# Patient Record
Sex: Female | Born: 1950 | Race: Black or African American | Hispanic: No | Smoking: Never smoker
Health system: Southern US, Community
[De-identification: ages and names within clinical notes are randomized; demographics above are authoritative.]

## PROBLEM LIST (undated history)

## (undated) ENCOUNTER — Ambulatory Visit: Admission: EM | Payer: Medicare HMO | Source: Home / Self Care

## (undated) DIAGNOSIS — R112 Nausea with vomiting, unspecified: Secondary | ICD-10-CM

## (undated) DIAGNOSIS — S68119A Complete traumatic metacarpophalangeal amputation of unspecified finger, initial encounter: Secondary | ICD-10-CM

## (undated) DIAGNOSIS — M1990 Unspecified osteoarthritis, unspecified site: Secondary | ICD-10-CM

## (undated) DIAGNOSIS — I10 Essential (primary) hypertension: Secondary | ICD-10-CM

## (undated) DIAGNOSIS — E1140 Type 2 diabetes mellitus with diabetic neuropathy, unspecified: Secondary | ICD-10-CM

## (undated) DIAGNOSIS — E669 Obesity, unspecified: Secondary | ICD-10-CM

## (undated) DIAGNOSIS — E119 Type 2 diabetes mellitus without complications: Secondary | ICD-10-CM

## (undated) DIAGNOSIS — E785 Hyperlipidemia, unspecified: Secondary | ICD-10-CM

## (undated) DIAGNOSIS — Z9889 Other specified postprocedural states: Secondary | ICD-10-CM

## (undated) HISTORY — DX: Type 2 diabetes mellitus without complications: E11.9

## (undated) HISTORY — PX: COLONOSCOPY: SHX174

## (undated) HISTORY — PX: TOTAL HIP ARTHROPLASTY: SHX124

## (undated) HISTORY — DX: Hyperlipidemia, unspecified: E78.5

## (undated) HISTORY — DX: Unspecified osteoarthritis, unspecified site: M19.90

## (undated) HISTORY — DX: Type 2 diabetes mellitus with diabetic neuropathy, unspecified: E11.40

## (undated) HISTORY — PX: ABDOMINAL HYSTERECTOMY: SHX81

## (undated) HISTORY — PX: HERNIA REPAIR: SHX51

## (undated) HISTORY — PX: REPLACEMENT TOTAL KNEE: SUR1224

## (undated) HISTORY — PX: OOPHORECTOMY: SHX6387

## (undated) HISTORY — DX: Essential (primary) hypertension: I10

---

## 2006-04-20 ENCOUNTER — Ambulatory Visit (HOSPITAL_COMMUNITY): Admission: RE | Admit: 2006-04-20 | Discharge: 2006-04-20 | Payer: Self-pay | Admitting: Family Medicine

## 2006-04-23 ENCOUNTER — Ambulatory Visit: Payer: Self-pay | Admitting: Orthopedic Surgery

## 2006-05-20 ENCOUNTER — Ambulatory Visit: Payer: Self-pay | Admitting: Orthopedic Surgery

## 2006-06-09 ENCOUNTER — Ambulatory Visit: Payer: Self-pay | Admitting: Orthopedic Surgery

## 2006-06-09 ENCOUNTER — Encounter: Payer: Self-pay | Admitting: Orthopedic Surgery

## 2006-06-09 ENCOUNTER — Inpatient Hospital Stay (HOSPITAL_COMMUNITY): Admission: RE | Admit: 2006-06-09 | Discharge: 2006-06-12 | Payer: Self-pay | Admitting: Orthopedic Surgery

## 2006-06-12 ENCOUNTER — Inpatient Hospital Stay (HOSPITAL_COMMUNITY): Admission: EM | Admit: 2006-06-12 | Discharge: 2006-06-15 | Payer: Self-pay | Admitting: Emergency Medicine

## 2006-06-15 ENCOUNTER — Inpatient Hospital Stay: Admission: AD | Admit: 2006-06-15 | Discharge: 2006-06-22 | Payer: Self-pay | Admitting: Family Medicine

## 2006-06-22 ENCOUNTER — Ambulatory Visit: Payer: Self-pay | Admitting: Orthopedic Surgery

## 2006-07-16 ENCOUNTER — Encounter (HOSPITAL_COMMUNITY): Admission: RE | Admit: 2006-07-16 | Discharge: 2006-08-15 | Payer: Self-pay | Admitting: Orthopedic Surgery

## 2006-07-21 ENCOUNTER — Ambulatory Visit: Payer: Self-pay | Admitting: Orthopedic Surgery

## 2006-09-02 ENCOUNTER — Ambulatory Visit: Payer: Self-pay | Admitting: Orthopedic Surgery

## 2006-12-03 ENCOUNTER — Ambulatory Visit: Payer: Self-pay | Admitting: Orthopedic Surgery

## 2006-12-03 DIAGNOSIS — M169 Osteoarthritis of hip, unspecified: Secondary | ICD-10-CM | POA: Insufficient documentation

## 2007-05-05 ENCOUNTER — Ambulatory Visit (HOSPITAL_COMMUNITY): Admission: RE | Admit: 2007-05-05 | Discharge: 2007-05-05 | Payer: Self-pay | Admitting: Family Medicine

## 2007-05-26 ENCOUNTER — Ambulatory Visit: Payer: Self-pay | Admitting: Orthopedic Surgery

## 2007-05-26 DIAGNOSIS — Z96642 Presence of left artificial hip joint: Secondary | ICD-10-CM | POA: Insufficient documentation

## 2007-05-26 DIAGNOSIS — M25569 Pain in unspecified knee: Secondary | ICD-10-CM | POA: Insufficient documentation

## 2007-05-26 DIAGNOSIS — Z96659 Presence of unspecified artificial knee joint: Secondary | ICD-10-CM | POA: Insufficient documentation

## 2008-05-16 ENCOUNTER — Ambulatory Visit (HOSPITAL_COMMUNITY): Admission: RE | Admit: 2008-05-16 | Discharge: 2008-05-16 | Payer: Self-pay | Admitting: Family Medicine

## 2008-08-08 ENCOUNTER — Ambulatory Visit (HOSPITAL_COMMUNITY): Admission: RE | Admit: 2008-08-08 | Discharge: 2008-08-08 | Payer: Self-pay | Admitting: Family Medicine

## 2008-12-13 HISTORY — PX: COLONOSCOPY: SHX174

## 2008-12-14 ENCOUNTER — Encounter: Payer: Self-pay | Admitting: Gastroenterology

## 2008-12-19 ENCOUNTER — Ambulatory Visit (HOSPITAL_COMMUNITY): Admission: RE | Admit: 2008-12-19 | Discharge: 2008-12-19 | Payer: Self-pay | Admitting: Gastroenterology

## 2008-12-19 ENCOUNTER — Ambulatory Visit: Payer: Self-pay | Admitting: Gastroenterology

## 2009-02-02 ENCOUNTER — Inpatient Hospital Stay (HOSPITAL_COMMUNITY): Admission: RE | Admit: 2009-02-02 | Discharge: 2009-02-03 | Payer: Self-pay | Admitting: General Surgery

## 2009-05-17 ENCOUNTER — Ambulatory Visit (HOSPITAL_COMMUNITY): Admission: RE | Admit: 2009-05-17 | Discharge: 2009-05-17 | Payer: Self-pay | Admitting: Family Medicine

## 2010-02-03 ENCOUNTER — Encounter: Payer: Self-pay | Admitting: Family Medicine

## 2010-03-31 LAB — BASIC METABOLIC PANEL
BUN: 12 mg/dL (ref 6–23)
Calcium: 9.7 mg/dL (ref 8.4–10.5)
Chloride: 102 mEq/L (ref 96–112)
Creatinine, Ser: 0.77 mg/dL (ref 0.4–1.2)
GFR calc non Af Amer: >60 mL/min (ref >60)
Potassium: 4 mEq/L (ref 3.5–5.1)
Sodium: 135 mEq/L (ref 135–145)

## 2010-03-31 LAB — CBC
HCT: 40 % (ref 36.0–46.0)
MCHC: 33.3 g/dL (ref 30.0–36.0)

## 2010-05-15 ENCOUNTER — Encounter: Payer: Self-pay | Admitting: Orthopedic Surgery

## 2010-05-17 ENCOUNTER — Other Ambulatory Visit: Payer: Self-pay | Admitting: Family Medicine

## 2010-05-17 DIAGNOSIS — Z139 Encounter for screening, unspecified: Secondary | ICD-10-CM

## 2010-05-24 ENCOUNTER — Ambulatory Visit (HOSPITAL_COMMUNITY)
Admission: RE | Admit: 2010-05-24 | Discharge: 2010-05-24 | Disposition: A | Discharge status: Home / Self Care | Payer: Medicare Other | Source: Ambulatory Visit | Attending: Family Medicine | Admitting: Family Medicine

## 2010-05-24 DIAGNOSIS — Z139 Encounter for screening, unspecified: Secondary | ICD-10-CM

## 2010-05-24 DIAGNOSIS — Z1231 Encounter for screening mammogram for malignant neoplasm of breast: Secondary | ICD-10-CM | POA: Insufficient documentation

## 2010-05-28 NOTE — Op Note (Signed)
NAMESHALIKA, ARNTZ NO.:  [id number]   MEDICAL RECORD NO.:  [id number]          PATIENT TYPE:  INP   LOCATION:  A340                          FACILITY:  [facility]   PHYSICIAN:  Vickki Hearing, M.D.DATE OF BIRTH:  Jul 31, 1950   DATE OF PROCEDURE:  06/09/2006  DATE OF DISCHARGE:                               OPERATIVE REPORT   HISTORY:  A 60 year old female with end-stage osteoarthritis of the left  hip presented with pain and dysfunction, failed conservative therapy.  After discussion of the risks and benefits of the procedure the informed  consent process was completed.  The patient understood her risks of  surgery and agreed for total hip replacement.  Her preoperative  treatment included anti-inflammatories and then Vicodin extra strength.   PREOPERATIVE DIAGNOSES:  Osteoarthritis left hip.   POSTOPERATIVE DIAGNOSES:  Osteoarthritis left hip.   PROCEDURE:  Left total hip system DePuy Cori stem number 11 standard  offset +9, 32 head with a 32 liner, 52 cup.  No screws.   SURGEON:  Vickki Hearing, M.D.   ASSISTANTDeniece Portela McFedder.  Marisa Severin.   SPECIMENS:  Femoral head went to path.   BLOOD LOSS:  300 mL.   COMPLICATIONS:  No complications.  The patient to PACU in good  condition.   DESCRIPTION OF PROCEDURE:  The patient's left hip was marked by the  patient as the surgical site.  I countersigned it and updated her  history and physical.  Took her to surgery for spinal anesthetic.  Then  we started antibiotics.  She was placed in a lateral decubitus position  right side down left side up, padding in the axilla and appropriately on  the leg.  She was put in the hip positioner and her left leg was prepped  and draped using sterile technique.   The time-out procedure was then completed.   A straight incision was made over the greater trochanter , extended  distally and curved gently proximally.  Subcutaneous tissue was divided  down to the  fascia.  The fascia was split in line with the skin incision  and the anterior and posterior aspects of the abductor mechanism was  identified.  With blunt dissection in line with the fibers of the  gluteus medius this was carried down to the greater trochanter.  Subperiosteal dissection was used to remove the gluteus medius and  gluteus minimus tendons from the trochanter and they were reflected  proximally and held with Steinmann pins.  The hip was dislocated  anteriorly.  Provisional femoral head cut was made followed by removal  of inferior and anterior capsule.  The femoral neck cutting guide was  used to refine the femoral neck cut.  Box osteotome, curette and starter  reamer were passed in the canal and then broaches were passed up to a  size 11.  The femoral neck was planed with a planer and the leg was then  placed in extension and slight flexion and the acetabulum was cleared of  all debris and bony osteophytes.   Retractors were placed to expose the acetabulum and  the acetabulum was  reamed starting with a 44 up to a 52.  A 52 trial liner was then placed.  A 32 trial liner was placed and then the standard femoral head +5 and +9  were placed on the femoral stem and trial reductions were performed.  A  +9 gave the best stability and leg lengths, had good extension, 5  degrees, 50 degrees of external rotation, flexion 120, at 90 degrees  flexion and 45 degrees of internal rotation.   The trial prosthesis were removed, the real implants were placed and a  repeat reduction and range of motion was done.  They matched our preop  range of motion.  We did place a hole eliminator in the central portion  of the acetabular cup.   The wound was irrigated.  The abductors were repaired with #5 Tycron  suture followed by closure of the fascia with #1 interrupted Bralon.  Subfascial layer was injected with 30 mL of Marcaine with epinephrine  0.25%.   We placed a pain pump catheter in the  subcu and closed with 0 and 2-0  Monocryl.  Staples applied to the skin.  Sterile dressing.  The patient  taken back to her bed with abduction pillow in place and to recovery  room in stable condition.  Postop plan full weightbearing, standard  protocol.      Vickki Hearing, M.D.  Electronically Signed     SEH/MEDQ  D:  06/09/2006  T:  06/09/2006  Job:  [id number]

## 2010-05-28 NOTE — Discharge Summary (Signed)
NAMEVALECIA, BESKE NO.:  [id number]   MEDICAL RECORD NO.:  [id number]          PATIENT TYPE:  INP   LOCATION:  A332                          FACILITY:  [facility]   PHYSICIAN:  Vickki Hearing, M.D.DATE OF BIRTH:  [birth year]   DATE OF ADMISSION:  06/12/2006  DATE OF DISCHARGE:  06/02/2008LH                               DISCHARGE SUMMARY   ADDENDUM:  She had a total hip replacement last Tuesday and discharged  to home Friday, came back to the hospital on the 31st, saying she just  did not feel like she was anemic.  She was typed and crossed for 2 units  of blood which were given and on June 1, hemoglobin was 11.1.  She was  stable, had some left lower extremity swelling and pain but nothing out  of the ordinary, and she is discharged to [facility] with no change in  the discharge summary noted on the 30th.      Vickki Hearing, M.D.  Electronically Signed     SEH/MEDQ  D:  06/15/2006  T:  06/15/2006  Job:  [id number]

## 2010-05-28 NOTE — Group Therapy Note (Signed)
NAMESOPHIAROSE, EADES NO.:  [id number]   MEDICAL RECORD NO.:  [id number]          PATIENT TYPE:  INP   LOCATION:  A340                          FACILITY:  [facility]   PHYSICIAN:  Vickki Hearing, M.D.DATE OF BIRTH:  19-Dec-1950   DATE OF PROCEDURE:  DATE OF DISCHARGE:                                 PROGRESS NOTE   T-max of 100.8, blood pressure is 137/69, pulse run in low 100/3  recorded, respiratory rate of 20.  O2 sat 99% on 2 liters. Hemoglobin  today is 10.9, sodium is 132, glucose 149, BUN and creatinine 7 and  0.97. She is status post a total hip replacement on the left pain  medicine response at this time on a PCA, morphine, and oral analgesics,  as well as a pain pump catheter reveal anywhere from a 1 to a 10, last  recorded was a 4 to a 6.  She is in therapy.  She is on DVT prevention  with Lovenox.  We will can continue therapy and adjust pain medicines as  required.      Vickki Hearing, M.D.  Electronically Signed     SEH/MEDQ  D:  06/11/2006  T:  06/11/2006  Job:  [id number]

## 2010-05-28 NOTE — Discharge Summary (Signed)
[name], [name] NO.:  [id number]   MEDICAL RECORD NO.:  [id number]          PATIENT TYPE:  INP   LOCATION:  A340                          FACILITY:  [facility]   PHYSICIAN:  Vickki Hearing, M.D.DATE OF BIRTH:  Oct 05, 1950   DATE OF ADMISSION:  06/09/2006  DATE OF DISCHARGE:  LH                               DISCHARGE SUMMARY   The patient max is 100.8 yesterday, it is currently 99.6, pulse 101 been  running low 100s.  Respiratory rate 20.  Blood pressure 118/60.  I&O are  good.  Hemoglobin is 9.6, potassium is 4.0, BUN and creatinine 7 and  0.84.  She is taking 2 tabs every 4 hours of Vicodin.  All indications  are that the patient has progressed well and will be able to be  discharged today.  Follow up will be June 22, 2006.      Vickki Hearing, M.D.  Electronically Signed     SEH/MEDQ  D:  06/12/2006  T:  06/12/2006  Job:  [id number]

## 2010-05-28 NOTE — Discharge Summary (Signed)
NAMEBAILEE, METTER NO.:  [id number]   MEDICAL RECORD NO.:  [id number]          PATIENT TYPE:  INP   LOCATION:  A340                          FACILITY:  [facility]   PHYSICIAN:  Vickki Hearing, M.D.DATE OF BIRTH:  1950-05-11   DATE OF ADMISSION:  06/09/2006  DATE OF DISCHARGE:  05/30/2008LH                               DISCHARGE SUMMARY   ADMITTING DIAGNOSIS:  Osteoarthritis of the left hip.   DISCHARGE DIAGNOSIS:  Osteoarthritis of the left hip.   SURGEON:  Vickki Hearing, M.D.   ANESTHETIC:  Spinal.   OPERATIVE FINDINGS:  Severe osteoarthritis of the left hip.   HISTORY AND HOSPITAL COURSE:  A 60 year old female with end-stage  osteoarthritis of the left hip presented with pain and dysfunction,  failed conservative treatment, consented for surgery by informed  consent.   She was brought in on the 27th, had an uncomplicated total hip  replacement on the left with a DePuy Press-Fit stem, Press-Fit head.  I  used a 32-mm head, 32 liner, 52 cup, no screws, standard 11 prosthesis  with a +9 neck head.   Postoperatively, the patient did well, no complications, tolerated  physical therapy well. Discharge hemoglobin is 9.6.   DISCHARGE INSTRUCTIONS:  Routine protocol for total hips. She will be  discharged with Lovenox 40 mg subcu q. day for the balance of 28 days,  iron 325 p.o. t.i.d., will give her Vicodin 10/650 instead of two 5 mg.  Will give her Robaxin 500 mg q. 6 p.r.n. She is discharged home.   CONDITION:  Improved.  Home health will come out for therapy.      Vickki Hearing, M.D.  Electronically Signed     SEH/MEDQ  D:  06/12/2006  T:  06/12/2006  Job:  [id number]

## 2010-05-28 NOTE — H&P (Signed)
[name], [name]               ACCOUNT NO.:  [id number]   MEDICAL RECORD NO.:  [id number]          PATIENT TYPE:  INP   LOCATION:  A332                          FACILITY:  [facility]   PHYSICIAN:  J. Darreld Mclean, M.D. DATE OF BIRTH:  06/24/1950   DATE OF ADMISSION:  06/12/2006  DATE OF DISCHARGE:  LH                              HISTORY & PHYSICAL   HISTORY OF PRESENT ILLNESS:  The patient was discharged from the  hospital yesterday morning.  Yesterday afternoon around 5 o'clock, she  presented back to the hospital emergency room, said she was unable to  take care of her self at home, unable to move about.  She was in  significant pain and just did not feel right.  The ER called me later in  the evening.  I told them to go ahead and re-admit her and pull her old  records and just resume the admission.  Resumed all the orders.   Her x-ray shows the hip is located on the left.  She had a total hip  arthroplasty.   Hemoglobin 8.4  The rest of the labs are normal, except for a slight  decrease in her sodium 129.   PHYSICAL EXAMINATION:  GENERAL:  She is alert, cooperative, and  oriented.  HEENT/NECK:  Neck is supple.  LUNGS:  Clear to P and A.  HEART:  Regular rhythm without murmur heard.  ABDOMEN:  Soft, nontender. With no masses.  EXTREMITIES:  Left hip wound.  She has good motion to the left hip.  Leg  lengths appear to be equal.  Other extremities negative.  CNS:  Intact  SKIN:  Intact.   IMPRESSION:  1. Status post left total hip arthroplasty on Tuesday of this past      week.  2. Postop anemia.  3. Mild pain consistent with postop procedure.   PLAN:  I will have physical therapy work with her.  Continue her current  medications and transfuse today.   Dr. Romeo Apple is out of town and I will follow her this weekend.  Physical therapy continued doing her rehab.                                            ______________________________  J. Darreld Mclean, M.D.     JWK/MEDQ  D:  06/13/2006  T:  06/13/2006  Job:  [id number]

## 2010-05-31 NOTE — H&P (Signed)
NAMERHONNA, HOLSTER NO.:  [id number]   MEDICAL RECORD NO.:  [id number]          PATIENT TYPE:  AMB   LOCATION:  DAY                           FACILITY:  [facility]   PHYSICIAN:  Vickki Hearing, M.D.DATE OF BIRTH:  1950/07/27   DATE OF ADMISSION:  DATE OF DISCHARGE:  LH                              HISTORY & PHYSICAL   CHIEF COMPLAINT:  Pain left hip.   This is a 60 year old female status post right total knee replacement  sent to me by Dr. Wende Crease and Dr. Hilda Lias for evaluation for left total  hip.   She has a strong family history of osteoarthritis with her sisters who  had joint replacements.  She also had a sister who had a lumbar fusion.   The patient has had pain for the last year graded 10/10, worse with  standing for long periods of time, walking long distances.  Improved  with rest, Vicodin ES and Naprosyn.  Her symptoms are described as sharp  radiating pain associated with giving out and weakness of the left leg.  She denies any back pain.   REVIEW OF SYSTEMS:  Includes weight gain, joint swelling, joint pain.  Denies chest pain, shortness of breath, nausea, kidney disease,  headache, dizziness, migraine, thyroid disease, diabetes, depression,  mood swing, eczema, poor vision, seasonal allergy or lymph node disease.   PAST HISTORY:  No known drug allergies.  No major medical problems.  She  had [place] right total knee done in [place].   CURRENT MEDICINES:  Vicodin ES and naproxen.   FAMILY HISTORY:  Arthritis.   She is separated, disabled.  She was not smoke or drink.  Highest grade  completed was 8.   PHYSICAL EXAMINATION:  VITAL SIGNS:  Weight 249, pulse 82, respiratory  rate 16.  APPEARANCE:  Body habitus endomorphic.  Development was normal.  Normal  nutrition.  Grooming and hygiene normal.  No deformity.  PERIPHERAL VASCULAR SYSTEM OBSERVATION:  No swelling or varicose veins.  Palpation of all four pulses were normal.  EXTREMITIES:  Warm  to touch  without edema or tenderness.  HEART:  Rate and rhythm were normal.  CHEST:  Clear.  ABDOMEN:  Soft.  LYMPH NODES:  Cervical spine were negative.  MUSCULOSKELETAL:  Gait and stations show a mild limp, slight varus to  the left knee, previous right total knee with excellent function and  motion.  Upper extremities:  Full range of motion, strength, stability  and alignment.  No reflex abnormality.  Left hip exam:  Flexion was only to 85 degrees, internal rotation 5,  external rotation 20, abduction 30, adduction 10, strength in the limb  was normal.  SKIN:  Integrity was intact.  Slight hypertrophic scar on the right.  NEUROLOGIC:  She is awake, alert and oriented x3.  Mood and affect  normal.   Radiographs showed degenerative joint disease of the hip.  These were  taken December 2007; repeated April 23, 2006.  The femoral head appears  to be entrapped in bony osteophytes.   Left knee shows DJD, primary patellofemoral disease, with symmetric  joint spaces though narrowed.   IMPRESSION:  1. Osteoarthritis left hip.  2. Previous right total hip.  3. Osteoarthritis left knee.   PLAN:  Left total hip with a DePuy system.  Will use Press-Fit stem,  Press-Fit cup with screws, 32-36 femoral head.   The surgery is scheduled for May 27.      Vickki Hearing, M.D.  Electronically Signed     SEH/MEDQ  D:  05/20/2006  T:  05/20/2006  Job:  [id number]

## 2010-06-06 ENCOUNTER — Encounter: Payer: Self-pay | Admitting: Orthopedic Surgery

## 2010-06-06 ENCOUNTER — Ambulatory Visit (INDEPENDENT_AMBULATORY_CARE_PROVIDER_SITE_OTHER): Payer: Medicare Other | Admitting: Orthopedic Surgery

## 2010-06-06 VITALS — HR 76 | Ht 68.0 in | Wt 280.0 lb

## 2010-06-06 DIAGNOSIS — IMO0002 Reserved for concepts with insufficient information to code with codable children: Secondary | ICD-10-CM

## 2010-06-06 DIAGNOSIS — M1710 Unilateral primary osteoarthritis, unspecified knee: Secondary | ICD-10-CM

## 2010-06-06 NOTE — Progress Notes (Signed)
X-ray report.  3 views, LEFT knee.  LEFT knee pain.  Minimal deformity in the LEFT knee. Lateral films show bone to bone changes. AP film shows mild/moderate joint space narrowing, symmetric. There are multiple osteophytes, especially around the patella, some around the tibia.  Impression osteoarthritis, LEFT knee

## 2010-06-06 NOTE — Patient Instructions (Signed)
Return in August for pre-op  

## 2010-06-06 NOTE — Progress Notes (Signed)
Pain LEFT knee.  60 years old, status post LEFT hip replacement, presents with LEFT knee pain, which is sharp, 7/10, tends to come and go, associated with difficulty climbing stairs, getting out of a chair and ambulating. He swells as well.  She is interested in knee replacement surgery.  Medical systems review reports weight gain joint pain joint swelling. All other systems reviewed negative.  Family History  Problem Relation Age of Onset  . Arthritis     Past Medical History  Diagnosis Date  . Arthritis   . High cholesterol    Past Surgical History  Procedure Date  . Joint replacement     right knee  . Left hip replacement   . Ovary removed   . Hernia removed     x2   History   Social History  . Marital Status: Single    Spouse Name: N/A    Number of Children: N/A  . Years of Education: N/A   Occupational History  . Not on file.   Social History Main Topics  . Smoking status: Never Smoker   . Smokeless tobacco: Not on file  . Alcohol Use: No  . Drug Use: No  . Sexually Active: Not on file   Other Topics Concern  . Not on file   Social History Narrative  . No narrative on file      General: The patient is normally developed, with normal grooming and hygiene. There are no gross deformities. The body habitus mild mod obesity CDV: The pulse and perfusion of the extremities are normal   LYMPH: There is no gross lymphadenopathy in the extremities   Skin: There are no rashes, ulcers or cafe-au-lait spot   Psyche: The patient is alert, awake and oriented.  Mood is normal   Neuro:  The coordination and balance are normal.  Sensation is normal. Reflexes are 2+ and equal   Musculoskeletal  LEFT knee flexion 102. There does not appear to be flexion contracture.No major deformities. Muscle strength. Muscle tone are normal.  He appears to be stable front to back and side to side.  Negative joint effusion. Tenderness medial and lateral joint line.  Upper  extremity exam  Inspection and palpation revealed no abnormalities in the upper extremities.  Range of motion is full without contracture.  Motor exam is normal with grade 5 strength.  The joints are fully reduced without subluxation.  There is no atrophy or tremor and muscle tone is normal.  All joints are stable.  X-ray shows osteoarthritis, LEFT knee, minimal deformity.  Diagnosis osteoarthritis, LEFT knee.  Plan LEFT total knee replacement.  She will return for preop paperwork.  Declined non operative treatment

## 2010-06-25 ENCOUNTER — Encounter: Payer: Self-pay | Admitting: Orthopedic Surgery

## 2010-06-25 ENCOUNTER — Ambulatory Visit: Payer: Self-pay | Admitting: Orthopedic Surgery

## 2010-08-15 ENCOUNTER — Encounter: Payer: Self-pay | Admitting: Orthopedic Surgery

## 2010-08-15 ENCOUNTER — Other Ambulatory Visit: Payer: Self-pay | Admitting: Orthopedic Surgery

## 2010-08-15 ENCOUNTER — Ambulatory Visit (INDEPENDENT_AMBULATORY_CARE_PROVIDER_SITE_OTHER): Payer: Medicare Other | Admitting: Orthopedic Surgery

## 2010-08-15 DIAGNOSIS — M6530 Trigger finger, unspecified finger: Secondary | ICD-10-CM

## 2010-08-15 DIAGNOSIS — IMO0002 Reserved for concepts with insufficient information to code with codable children: Secondary | ICD-10-CM

## 2010-08-15 DIAGNOSIS — M1710 Unilateral primary osteoarthritis, unspecified knee: Secondary | ICD-10-CM | POA: Insufficient documentation

## 2010-08-15 MED ORDER — METHYLPREDNISOLONE ACETATE 40 MG/ML IJ SUSP: 40.0000 mg | Freq: Once | INTRAMUSCULAR | Status: DC

## 2010-08-15 NOTE — Progress Notes (Signed)
2007 RIGHT knee in [place]. LEFT total hip hear in May of 2008.  Stability scheduling for LEFT knee replacement.  Complain of LEFT trigger thumb for injection today.  Also complains of radicular pain, RIGHT lower extremity with pain in her RIGHT knee and also pain in her RIGHT hip. So we will get a lumbar spine series and a RIGHT hip. X-ray with pelvis and LEFT hip. X-ray but we will do those at the hospital.  See preop h/p

## 2010-08-15 NOTE — H&P (Signed)
[name] is an 60 y.o. female.   Chief Complaint: left knee pain  HPI: This patient is 60 years old. He had a RIGHT total knee arthroplasty in 2007. She had a LEFT total hip replacement approximately 2 years ago presents now with complaints of severe dull, aching pain, which has become constant in the LEFT knee. Her x-rays show degenerative arthritis of the LEFT knee. She would like to proceed with a LEFT total knee replacement. She understands the risks and benefits of the surgery and understands that nonoperative treatment is still an option.    Past Medical History  Diagnosis Date  . Arthritis   . High cholesterol     Past Surgical History  Procedure Date  . Joint replacement     right knee  . Left hip replacement   . Ovary removed   . Hernia removed     x2    Family History  Problem Relation Age of Onset  . Arthritis     Social History:  reports that she has never smoked. She does not have any smokeless tobacco history on file. She reports that she does not drink alcohol or use illicit drugs.  Allergies: No Known Allergies  Medications Prior to Admission  Medication Sig Dispense Refill  . aspirin 81 MG tablet Take 81 mg by mouth daily.        . Calcium Carbonate-Vitamin D (CALCIUM + D PO) Take by mouth.        . fish oil-omega-3 fatty acids 1000 MG capsule Take 2 g by mouth daily.        . Flaxseed, Linseed, (FLAX SEED OIL PO) Take by mouth.        Marland Kitchen PRAVASTATIN SODIUM PO Take by mouth.         No current facility-administered medications on file as of 08/15/2010.    No results found for this or any previous visit (from the past 48 hour(s)). @RISRSLT48 @  Review of Systems  Constitutional: Negative.   HENT: Negative.   Eyes: Negative.   Respiratory: Negative.   Cardiovascular: Negative.   Gastrointestinal: Negative.   Genitourinary: Negative.   Skin: Negative.   Neurological: Negative.   Endo/Heme/Allergies: Negative.   Psychiatric/Behavioral: Negative.      There were no vitals taken for this visit. Physical Exam  Vital signs are stable as recorded  General appearance is normal  The patient is alert and oriented x3  The patient's mood and affect are normal  Gait assessment: She has an abnormal gait pattern associated with the previous joint replacement and the pain in the LEFT knee The cardiovascular exam reveals normal pulses and temperature without edema swelling.  The lymphatic system is negative for palpable lymph nodes  The sensory exam is normal.  There are no pathologic reflexes.  Balance is normal.   Exam of the LEFT knee Inspection Medial joint line tenderness, mild joint swelling Range of motion Minimal flexion contracture, arc and flexion is 115 Stability Of the ligaments tested normal Strength Normal Skin Normal  Upper extremity exam  Inspection and palpation revealed no abnormalities in the upper extremities.  Range of motion is full without contracture.  Motor exam is normal with grade 5 strength.  The joints are fully reduced without subluxation.  There is no atrophy or tremor and muscle tone is normal.  All joints are stable.   RLE exam knee flexion is 120 knee is stable ligs are stable no tenderness    Assessment/Plan  XRAYS  3 views, LEFT knee were done in May. The alignment of the knee has slight varus. There is notable degenerative change in the medial and patellofemoral compartments with minimal osteophytes. Impression osteoarthritis with mild deformity, LEFT knee.  OA LEFT KNEE   LEFT TKA DEPUY  Fuller Canada 08/15/2010, 9:29 AM

## 2010-08-15 NOTE — Patient Instructions (Signed)
You have been scheduled for surgery.  All surgeries carry some risk.  Remember you always have the option of continued nonsurgical treatment. However in this situation the risks vs. the benefits favor surgery as the best treatment option. The risks of the surgery includes the following but is not limited to bleeding, infection, pulmonary embolus, death from anesthesia, nerve injury vascular injury or need for further surgery, continued pain.  Specific to this procedure the following risks and complications are rare but possible Stiffness, pain, infection which requires revision surgery.

## 2010-08-21 ENCOUNTER — Telehealth: Payer: Self-pay | Admitting: Orthopedic Surgery

## 2010-08-21 NOTE — Telephone Encounter (Signed)
No new note. Error

## 2010-08-29 ENCOUNTER — Telehealth: Payer: Self-pay | Admitting: Orthopedic Surgery

## 2010-08-29 NOTE — Telephone Encounter (Signed)
Contacted [employer] insurance (initially 08/21/10) and 08/29/10, left voice mail message at [phone].  Per automated response system, faxed clinicals to fax [phone].  CPT [id number], ICD9 codes 715.16, 715.96.  In-patient surgery, scheduled 09/09/10 at [facility]

## 2010-09-02 NOTE — Telephone Encounter (Signed)
09/02/10 Received call back from [facility], [place], ph [phone], in response to fax of clinicals.  Received authorization # for 09/09/10 in-patient surgery per above note, CPT 27447.  Auth # [id number], approved for up to 7 day stay at [facility].

## 2010-09-04 ENCOUNTER — Other Ambulatory Visit (HOSPITAL_COMMUNITY): Payer: Self-pay | Admitting: Orthopedic Surgery

## 2010-09-04 ENCOUNTER — Ambulatory Visit (HOSPITAL_COMMUNITY)
Admission: RE | Admit: 2010-09-04 | Discharge: 2010-09-04 | Disposition: A | Discharge status: Home / Self Care | Payer: Medicare Other | Source: Ambulatory Visit | Attending: Orthopedic Surgery | Admitting: Orthopedic Surgery

## 2010-09-04 ENCOUNTER — Encounter (HOSPITAL_COMMUNITY)
Admission: RE | Admit: 2010-09-04 | Discharge: 2010-09-04 | Disposition: A | Discharge status: Home / Self Care | Payer: Medicare Other | Source: Ambulatory Visit | Attending: Orthopedic Surgery | Admitting: Orthopedic Surgery

## 2010-09-04 ENCOUNTER — Other Ambulatory Visit: Payer: Self-pay

## 2010-09-04 ENCOUNTER — Encounter (HOSPITAL_COMMUNITY): Payer: Self-pay

## 2010-09-04 DIAGNOSIS — R52 Pain, unspecified: Secondary | ICD-10-CM

## 2010-09-04 HISTORY — DX: Other specified postprocedural states: Z98.890

## 2010-09-04 HISTORY — DX: Nausea with vomiting, unspecified: R11.2

## 2010-09-04 HISTORY — DX: Complete traumatic metacarpophalangeal amputation of unspecified finger, initial encounter: S68.119A

## 2010-09-04 LAB — SURGICAL PCR SCREEN
MRSA, PCR: NEGATIVE
Staphylococcus aureus: NEGATIVE

## 2010-09-04 LAB — CBC
MCH: 28.3 pg (ref 26.0–34.0)
MCHC: 32.6 g/dL (ref 30.0–36.0)
MCV: 86.8 fL (ref 78.0–100.0)
Platelets: 190 10*3/uL (ref 150–400)
RDW: 13.6 % (ref 11.5–15.5)

## 2010-09-04 LAB — PREPARE RBC (CROSSMATCH)

## 2010-09-04 LAB — PROTIME-INR: Prothrombin Time: 12.2 seconds (ref 11.6–15.2)

## 2010-09-04 NOTE — Patient Instructions (Signed)
[address] [place] [name]  09/04/2010   Your procedure is scheduled on:  09/09/10  Report to [facility] at  615  AM.  Call this number if you have problems the morning of surgery: [phone]   Remember:   Do not eat food:After Midnight.  Do not drink clear liquids: After Midnight.  Take these medicines the morning of surgery with A SIP OF WATER: none   Do not wear jewelry, make-up or nail polish.  Do not wear lotions, powders, or perfumes. You may wear deodorant.  Do not shave 48 hours prior to surgery.  Do not bring valuables to the hospital.  Contacts, dentures or bridgework may not be worn into surgery.  Leave suitcase in the car. After surgery it may be brought to your room.  For patients admitted to the hospital, checkout time is 11:00 AM the day of discharge.   Patients discharged the day of surgery will not be allowed to drive home.  Name and phone number of your driver: family  Special Instructions: CHG Shower Use Special Wash: 1/2 bottle night before surgery and 1/2 bottle morning of surgery.   Please read over the following fact sheets that you were given: Pain Booklet, Coughing and Deep Breathing, Blood Transfusion Information, Lab Information, Total Joint Packet, MRSA Information, Surgical Site Infection Prevention, Anesthesia Post-op Instructions and Care and Recovery After Surgery PATIENT INSTRUCTIONS POST-ANESTHESIA  IMMEDIATELY FOLLOWING SURGERY:  Do not drive or operate machinery for the first twenty four hours after surgery.  Do not make any important decisions for twenty four hours after surgery or while taking narcotic pain medications or sedatives.  If you develop intractable nausea and vomiting or a severe headache please notify your doctor immediately.  FOLLOW-UP:  Please make an appointment with your surgeon as instructed. You do not need to follow up with anesthesia unless specifically instructed to do so.  WOUND CARE INSTRUCTIONS (if applicable):  Keep a dry clean  dressing on the anesthesia/puncture wound site if there is drainage.  Once the wound has quit draining you may leave it open to air.  Generally you should leave the bandage intact for twenty four hours unless there is drainage.  If the epidural site drains for more than 36-48 hours please call the anesthesia department.  QUESTIONS?:  Please feel free to call your physician or the hospital operator if you have any questions, and they will be happy to assist you.     [facility] Anesthesia Department [address] [place] [phone]

## 2010-09-06 ENCOUNTER — Telehealth: Payer: Self-pay | Admitting: Orthopedic Surgery

## 2010-09-06 ENCOUNTER — Telehealth: Payer: Self-pay | Admitting: *Deleted

## 2010-09-06 NOTE — Telephone Encounter (Signed)
Faxed information to [place] and [employer] for assistance with post op TKA

## 2010-09-06 NOTE — Telephone Encounter (Signed)
In following up with [facility], ph [phone], reached [phone] - DIRECT PH,Deborah W,nurse reviewer. She states the authorization was just updated/approved for correct date 09/09/10, for up to 7 days.  Original auth had listed Oct.27. Updated and taken care of.

## 2010-09-09 ENCOUNTER — Encounter (HOSPITAL_COMMUNITY)
Admission: RE | Disposition: A | Discharge status: Skilled Nursing Facility | Payer: Self-pay | Source: Ambulatory Visit | Attending: Orthopedic Surgery

## 2010-09-09 ENCOUNTER — Inpatient Hospital Stay (HOSPITAL_COMMUNITY): Payer: Medicare Other

## 2010-09-09 ENCOUNTER — Encounter (HOSPITAL_COMMUNITY): Payer: Self-pay | Admitting: Anesthesiology

## 2010-09-09 ENCOUNTER — Inpatient Hospital Stay (HOSPITAL_COMMUNITY): Payer: Medicare Other | Admitting: Anesthesiology

## 2010-09-09 ENCOUNTER — Encounter (HOSPITAL_COMMUNITY): Payer: Self-pay | Admitting: *Deleted

## 2010-09-09 ENCOUNTER — Inpatient Hospital Stay (HOSPITAL_COMMUNITY)
Admission: RE | Admit: 2010-09-09 | Discharge: 2010-09-13 | DRG: 470 | Disposition: A | Discharge status: Skilled Nursing Facility | Payer: Medicare Other | Source: Ambulatory Visit | Attending: Orthopedic Surgery | Admitting: Orthopedic Surgery

## 2010-09-09 DIAGNOSIS — Z96659 Presence of unspecified artificial knee joint: Secondary | ICD-10-CM

## 2010-09-09 DIAGNOSIS — IMO0002 Reserved for concepts with insufficient information to code with codable children: Principal | ICD-10-CM

## 2010-09-09 DIAGNOSIS — M1710 Unilateral primary osteoarthritis, unspecified knee: Secondary | ICD-10-CM

## 2010-09-09 DIAGNOSIS — Z96649 Presence of unspecified artificial hip joint: Secondary | ICD-10-CM

## 2010-09-09 HISTORY — PX: TOTAL KNEE ARTHROPLASTY: SHX125

## 2010-09-09 LAB — BASIC METABOLIC PANEL
BUN: 15 mg/dL (ref 6–23)
Chloride: 101 mEq/L (ref 96–112)
Creatinine, Ser: 0.76 mg/dL (ref 0.50–1.10)
GFR calc Af Amer: >60 mL/min (ref >60)

## 2010-09-09 SURGERY — ARTHROPLASTY, KNEE, TOTAL
Anesthesia: Spinal | Site: Knee | Laterality: Left | Wound class: Clean

## 2010-09-09 MED ORDER — BUPIVACAINE-EPINEPHRINE PF 0.5-1:200000 % IJ SOLN
INTRAMUSCULAR | Status: AC
  Filled 2010-09-09: qty 20

## 2010-09-09 MED ORDER — ACETAMINOPHEN 500 MG PO TABS
ORAL_TABLET | ORAL | Status: AC
  Administered 2010-09-09: 500 mg via ORAL
  Filled 2010-09-09: qty 1

## 2010-09-09 MED ORDER — METHOCARBAMOL 100 MG/ML IJ SOLN
500.0000 mg | Freq: Four times a day (QID) | INTRAVENOUS | Status: DC | PRN
  Filled 2010-09-09: qty 5

## 2010-09-09 MED ORDER — POLYETHYLENE GLYCOL 3350 17 G PO PACK: 17.0000 g | PACK | Freq: Every day | ORAL | Status: DC | PRN

## 2010-09-09 MED ORDER — BUPIVACAINE 0.25 % ON-Q PUMP SINGLE CATH 300ML
INJECTION | Status: DC | PRN
  Administered 2010-09-09: 300 mL

## 2010-09-09 MED ORDER — MIDAZOLAM HCL 2 MG/2ML IJ SOLN
1.0000 mg | INTRAMUSCULAR | Status: DC | PRN
  Administered 2010-09-09: 2 mg via INTRAVENOUS

## 2010-09-09 MED ORDER — SIMVASTATIN 20 MG PO TABS
40.0000 mg | ORAL_TABLET | Freq: Every day | ORAL | Status: DC
  Administered 2010-09-09: 20 mg via ORAL
  Administered 2010-09-10 – 2010-09-12 (×3): 40 mg via ORAL
  Filled 2010-09-09 (×3): qty 2
  Filled 2010-09-09: qty 1

## 2010-09-09 MED ORDER — CEFAZOLIN SODIUM 1-5 GM-% IV SOLN
INTRAVENOUS | Status: DC | PRN
  Administered 2010-09-09: 2 g via INTRAVENOUS

## 2010-09-09 MED ORDER — ALUMINUM HYDROXIDE GEL 600 MG/5ML PO SUSP
15.0000 mL | ORAL | Status: DC | PRN
  Filled 2010-09-09: qty 30

## 2010-09-09 MED ORDER — CEFAZOLIN SODIUM-DEXTROSE 2-3 GM-% IV SOLR: 2.0000 g | INTRAVENOUS | Status: DC

## 2010-09-09 MED ORDER — ALUM & MAG HYDROXIDE-SIMETH 200-200-20 MG/5ML PO SUSP: 30.0000 mL | ORAL | Status: DC | PRN

## 2010-09-09 MED ORDER — MENTHOL 3 MG MT LOZG: 1.0000 | LOZENGE | OROMUCOSAL | Status: DC | PRN

## 2010-09-09 MED ORDER — BUPIVACAINE IN DEXTROSE 0.75-8.25 % IT SOLN
INTRATHECAL | Status: AC
  Filled 2010-09-09: qty 2

## 2010-09-09 MED ORDER — SODIUM CHLORIDE 0.9 % IR SOLN
Status: DC | PRN
  Administered 2010-09-09: 3000 mL

## 2010-09-09 MED ORDER — FLEET ENEMA 7-19 GM/118ML RE ENEM: 1.0000 | ENEMA | Freq: Every day | RECTAL | Status: DC | PRN

## 2010-09-09 MED ORDER — ASPIRIN EC 325 MG PO TBEC
325.0000 mg | DELAYED_RELEASE_TABLET | Freq: Two times a day (BID) | ORAL | Status: DC
  Administered 2010-09-10 – 2010-09-13 (×7): 325 mg via ORAL
  Filled 2010-09-09 (×7): qty 1

## 2010-09-09 MED ORDER — PROPOFOL 10 MG/ML IV EMUL
INTRAVENOUS | Status: AC
  Filled 2010-09-09: qty 20

## 2010-09-09 MED ORDER — MAGNESIUM HYDROXIDE 400 MG/5ML PO SUSP: 30.0000 mL | Freq: Two times a day (BID) | ORAL | Status: DC | PRN

## 2010-09-09 MED ORDER — ACETAMINOPHEN 650 MG RE SUPP: 650.0000 mg | Freq: Four times a day (QID) | RECTAL | Status: DC | PRN

## 2010-09-09 MED ORDER — HYDROMORPHONE HCL 1 MG/ML IJ SOLN
0.5000 mg | INTRAMUSCULAR | Status: DC | PRN
  Administered 2010-09-09 – 2010-09-10 (×3): 1 mg via INTRAVENOUS
  Filled 2010-09-09 (×5): qty 1

## 2010-09-09 MED ORDER — ACETAMINOPHEN 325 MG PO TABS: 650.0000 mg | ORAL_TABLET | Freq: Four times a day (QID) | ORAL | Status: DC | PRN

## 2010-09-09 MED ORDER — ONDANSETRON HCL 4 MG PO TABS: 4.0000 mg | ORAL_TABLET | Freq: Four times a day (QID) | ORAL | Status: DC | PRN

## 2010-09-09 MED ORDER — METOCLOPRAMIDE HCL 5 MG/ML IJ SOLN
5.0000 mg | Freq: Three times a day (TID) | INTRAMUSCULAR | Status: DC | PRN
  Administered 2010-09-09: 10 mg via INTRAVENOUS
  Filled 2010-09-09: qty 2

## 2010-09-09 MED ORDER — METOCLOPRAMIDE HCL 10 MG PO TABS: 5.0000 mg | ORAL_TABLET | Freq: Three times a day (TID) | ORAL | Status: DC | PRN

## 2010-09-09 MED ORDER — ONDANSETRON HCL 4 MG/2ML IJ SOLN
INTRAMUSCULAR | Status: AC
  Filled 2010-09-09: qty 2

## 2010-09-09 MED ORDER — CALCIUM CARBONATE-VITAMIN D 500-200 MG-UNIT PO TABS
2.0000 | ORAL_TABLET | Freq: Every day | ORAL | Status: DC
  Administered 2010-09-09 – 2010-09-13 (×5): 2 via ORAL
  Filled 2010-09-09 (×5): qty 2

## 2010-09-09 MED ORDER — CHLORHEXIDINE GLUCONATE 4 % EX LIQD
60.0000 mL | Freq: Once | CUTANEOUS | Status: DC
  Filled 2010-09-09: qty 118

## 2010-09-09 MED ORDER — HYDROCODONE-ACETAMINOPHEN 5-325 MG PO TABS
ORAL_TABLET | ORAL | Status: AC
  Administered 2010-09-09: 1 via ORAL
  Filled 2010-09-09: qty 1

## 2010-09-09 MED ORDER — EPHEDRINE SULFATE 50 MG/ML IJ SOLN
INTRAMUSCULAR | Status: AC
  Filled 2010-09-09: qty 1

## 2010-09-09 MED ORDER — CALCIUM CARB-CHOLECALCIFEROL 600-400 MG-UNIT PO TABS
2.0000 | ORAL_TABLET | Freq: Every day | ORAL | Status: DC
Start: 2010-09-09 — End: 2010-09-09

## 2010-09-09 MED ORDER — MIDAZOLAM HCL 2 MG/2ML IJ SOLN
INTRAMUSCULAR | Status: AC
  Administered 2010-09-09: 2 mg via INTRAVENOUS
  Filled 2010-09-09: qty 2

## 2010-09-09 MED ORDER — BUPIVACAINE-EPINEPHRINE 0.5% -1:200000 IJ SOLN
INTRAMUSCULAR | Status: DC | PRN
  Administered 2010-09-09: 90 mL

## 2010-09-09 MED ORDER — PHENOL 1.4 % MT LIQD: 1.0000 | OROMUCOSAL | Status: DC | PRN

## 2010-09-09 MED ORDER — CEFAZOLIN SODIUM-DEXTROSE 2-3 GM-% IV SOLR
2.0000 g | Freq: Four times a day (QID) | INTRAVENOUS | Status: AC
  Administered 2010-09-09 – 2010-09-10 (×3): 2 g via INTRAVENOUS
  Filled 2010-09-09 (×3): qty 50

## 2010-09-09 MED ORDER — BUPIVACAINE HCL 0.75 % IJ SOLN
INTRAMUSCULAR | Status: DC | PRN
  Administered 2010-09-09: 15 mg

## 2010-09-09 MED ORDER — OMEGA-3 FATTY ACIDS 1000 MG PO CAPS: 3.0000 | ORAL_CAPSULE | Freq: Every day | ORAL | Status: DC

## 2010-09-09 MED ORDER — HEPARIN SODIUM (PORCINE) 5000 UNIT/ML IJ SOLN
5000.0000 [IU] | Freq: Two times a day (BID) | INTRAMUSCULAR | Status: DC
  Administered 2010-09-09 – 2010-09-13 (×9): 5000 [IU] via SUBCUTANEOUS
  Filled 2010-09-09 (×9): qty 1

## 2010-09-09 MED ORDER — ONDANSETRON HCL 4 MG/2ML IJ SOLN
4.0000 mg | Freq: Four times a day (QID) | INTRAMUSCULAR | Status: DC | PRN
  Administered 2010-09-09: 4 mg via INTRAVENOUS
  Filled 2010-09-09: qty 2

## 2010-09-09 MED ORDER — CELECOXIB 100 MG PO CAPS
400.0000 mg | ORAL_CAPSULE | Freq: Once | ORAL | Status: AC
  Administered 2010-09-09: 400 mg via ORAL

## 2010-09-09 MED ORDER — BUPIVACAINE-EPINEPHRINE PF 0.5-1:200000 % IJ SOLN
INTRAMUSCULAR | Status: AC
  Filled 2010-09-09: qty 10

## 2010-09-09 MED ORDER — DOCUSATE SODIUM 100 MG PO CAPS
100.0000 mg | ORAL_CAPSULE | Freq: Two times a day (BID) | ORAL | Status: DC
  Administered 2010-09-09 – 2010-09-13 (×9): 100 mg via ORAL
  Filled 2010-09-09 (×9): qty 1

## 2010-09-09 MED ORDER — PSYLLIUM 95 % PO PACK
1.0000 | PACK | Freq: Every day | ORAL | Status: DC
  Administered 2010-09-09 – 2010-09-13 (×5): 1 via ORAL
  Filled 2010-09-09 (×8): qty 1

## 2010-09-09 MED ORDER — DIPHENHYDRAMINE HCL 12.5 MG/5ML PO ELIX
12.5000 mg | ORAL_SOLUTION | ORAL | Status: DC | PRN
Start: 2010-09-09 — End: 2010-09-13

## 2010-09-09 MED ORDER — BISACODYL 10 MG RE SUPP
10.0000 mg | Freq: Every day | RECTAL | Status: DC | PRN
  Filled 2010-09-09: qty 1

## 2010-09-09 MED ORDER — LACTATED RINGERS IV SOLN
INTRAVENOUS | Status: DC | PRN
  Administered 2010-09-09: 07:00:00 via INTRAVENOUS

## 2010-09-09 MED ORDER — FLAX SEED OIL 1000 MG PO CAPS: ORAL_CAPSULE | Freq: Every day | ORAL | Status: DC

## 2010-09-09 MED ORDER — ACETAMINOPHEN 500 MG PO TABS
500.0000 mg | ORAL_TABLET | Freq: Once | ORAL | Status: AC
  Administered 2010-09-09: 500 mg via ORAL

## 2010-09-09 MED ORDER — HYDROCODONE-ACETAMINOPHEN 5-325 MG PO TABS
1.0000 | ORAL_TABLET | ORAL | Status: DC
  Administered 2010-09-09 – 2010-09-12 (×16): 1 via ORAL
  Filled 2010-09-09 (×16): qty 1

## 2010-09-09 MED ORDER — CEFAZOLIN SODIUM 1-5 GM-% IV SOLN
INTRAVENOUS | Status: AC
  Filled 2010-09-09: qty 100

## 2010-09-09 MED ORDER — ONDANSETRON HCL 4 MG/2ML IJ SOLN
INTRAMUSCULAR | Status: AC
  Administered 2010-09-09: 4 mg via INTRAVENOUS
  Filled 2010-09-09: qty 2

## 2010-09-09 MED ORDER — PROPOFOL 10 MG/ML IV EMUL
INTRAVENOUS | Status: DC | PRN
  Administered 2010-09-09: 25 ug/kg/min via INTRAVENOUS

## 2010-09-09 MED ORDER — ONDANSETRON HCL 4 MG/2ML IJ SOLN: 4.0000 mg | Freq: Once | INTRAMUSCULAR | Status: DC

## 2010-09-09 MED ORDER — EPINEPHRINE HCL 0.1 MG/ML IJ SOLN
INTRAMUSCULAR | Status: DC | PRN
  Administered 2010-09-09: 10 ug via INTRAVENOUS

## 2010-09-09 MED ORDER — ONDANSETRON HCL 4 MG/2ML IJ SOLN
4.0000 mg | Freq: Once | INTRAMUSCULAR | Status: AC
  Administered 2010-09-09: 4 mg via INTRAVENOUS

## 2010-09-09 MED ORDER — METHOCARBAMOL 500 MG PO TABS: 500.0000 mg | ORAL_TABLET | Freq: Four times a day (QID) | ORAL | Status: DC | PRN

## 2010-09-09 MED ORDER — LACTATED RINGERS IV SOLN
INTRAVENOUS | Status: DC
  Administered 2010-09-09: 50 mL/h via INTRAVENOUS

## 2010-09-09 MED ORDER — CELECOXIB 100 MG PO CAPS
ORAL_CAPSULE | ORAL | Status: AC
  Administered 2010-09-09: 400 mg via ORAL
  Filled 2010-09-09: qty 4

## 2010-09-09 MED ORDER — LIDOCAINE HCL (PF) 1 % IJ SOLN
INTRAMUSCULAR | Status: AC
  Filled 2010-09-09: qty 5

## 2010-09-09 MED ORDER — EPHEDRINE SULFATE 50 MG/ML IJ SOLN
INTRAMUSCULAR | Status: DC | PRN
  Administered 2010-09-09: 5 mg via INTRAVENOUS

## 2010-09-09 MED ORDER — METHOCARBAMOL 100 MG/ML IJ SOLN
500.0000 mg | Freq: Once | INTRAVENOUS | Status: AC
  Administered 2010-09-09: 500 mg via INTRAVENOUS
  Filled 2010-09-09: qty 5

## 2010-09-09 MED ORDER — FENTANYL CITRATE 0.05 MG/ML IJ SOLN
INTRAMUSCULAR | Status: AC
  Filled 2010-09-09: qty 2

## 2010-09-09 MED ORDER — FENTANYL CITRATE 0.05 MG/ML IJ SOLN
INTRAMUSCULAR | Status: DC | PRN
  Administered 2010-09-09: 30 ug via INTRAVENOUS
  Administered 2010-09-09: 20 ug via INTRAVENOUS
  Administered 2010-09-09: 50 ug via INTRAVENOUS

## 2010-09-09 MED ORDER — OXYCODONE HCL 5 MG PO TABS
5.0000 mg | ORAL_TABLET | ORAL | Status: DC | PRN
Start: 2010-09-09 — End: 2010-09-13
  Administered 2010-09-10 – 2010-09-13 (×10): 10 mg via ORAL
  Filled 2010-09-09 (×10): qty 2

## 2010-09-09 MED ORDER — TEMAZEPAM 15 MG PO CAPS: 15.0000 mg | ORAL_CAPSULE | Freq: Every evening | ORAL | Status: DC | PRN

## 2010-09-09 MED ORDER — OMEGA-3-ACID ETHYL ESTERS 1 G PO CAPS
3.0000 g | ORAL_CAPSULE | Freq: Two times a day (BID) | ORAL | Status: DC
  Administered 2010-09-09 – 2010-09-13 (×9): 3 g via ORAL
  Filled 2010-09-09: qty 3
  Filled 2010-09-09: qty 2
  Filled 2010-09-09 (×2): qty 3
  Filled 2010-09-09: qty 1
  Filled 2010-09-09 (×3): qty 3
  Filled 2010-09-09: qty 1
  Filled 2010-09-09: qty 2
  Filled 2010-09-09: qty 3

## 2010-09-09 MED ORDER — LACTATED RINGERS IV SOLN: INTRAVENOUS | Status: DC

## 2010-09-09 MED ORDER — LACTATED RINGERS IV SOLN
INTRAVENOUS | Status: DC
  Administered 2010-09-10: 1000 mL via INTRAVENOUS

## 2010-09-09 MED ORDER — HYDROCODONE-ACETAMINOPHEN 5-325 MG PO TABS
1.0000 | ORAL_TABLET | Freq: Once | ORAL | Status: AC
  Administered 2010-09-09: 1 via ORAL

## 2010-09-09 MED ORDER — BISACODYL 5 MG PO TBEC
10.0000 mg | DELAYED_RELEASE_TABLET | Freq: Every day | ORAL | Status: DC | PRN
  Administered 2010-09-12: 10 mg via ORAL
  Filled 2010-09-09: qty 2

## 2010-09-09 SURGICAL SUPPLY — 76 items
BAG HAMPER (MISCELLANEOUS) ×2 IMPLANT
BANDAGE ELASTIC 4 VELCRO NS (GAUZE/BANDAGES/DRESSINGS) ×2 IMPLANT
BANDAGE ELASTIC 6 VELCRO NS (GAUZE/BANDAGES/DRESSINGS) ×4 IMPLANT
BANDAGE ESMARK 6X9 LF (GAUZE/BANDAGES/DRESSINGS) ×1 IMPLANT
BIT DRILL 3.2X128 (BIT) ×2 IMPLANT
BLADE HEX COATED 2.75 (ELECTRODE) ×2 IMPLANT
BLADE SAG 18X100X1.27 (BLADE) ×2 IMPLANT
BLADE SAGITTAL 25.0X1.27X90 (BLADE) IMPLANT
BLADE SAW SAG 90X13X1.27 (BLADE) ×2 IMPLANT
BLADE SURG SZ10 CARB STEEL (BLADE) ×2 IMPLANT
BNDG CMPR 9X6 STRL LF SNTH (GAUZE/BANDAGES/DRESSINGS) ×1
BNDG ESMARK 6X9 LF (GAUZE/BANDAGES/DRESSINGS) ×2
BOWL SMART MIX CTS (DISPOSABLE) IMPLANT
CATH KIT ON Q 2.5IN SLV (PAIN MANAGEMENT) ×2 IMPLANT
CEMENT HV SMART SET (Cement) ×4 IMPLANT
CHLORAPREP W/TINT 26ML (MISCELLANEOUS) ×2 IMPLANT
CLOTH BEACON ORANGE TIMEOUT ST (SAFETY) ×2 IMPLANT
COOLER CRYO CUFF IC AND MOTOR (MISCELLANEOUS) ×2 IMPLANT
COVER LIGHT HANDLE STERIS (MISCELLANEOUS) ×4 IMPLANT
COVER PROBE W GEL 5X96 (DRAPES) ×2 IMPLANT
CUFF CRYO KNEE LG 20X31 COOLER (ORTHOPEDIC SUPPLIES) ×2 IMPLANT
CUFF CRYO KNEE18X23 MED (MISCELLANEOUS) ×1 IMPLANT
CUFF TOURNIQUET SINGLE 34IN LL (TOURNIQUET CUFF) ×1 IMPLANT
CUFF TOURNIQUET SINGLE 44IN (TOURNIQUET CUFF) ×2 IMPLANT
DECANTER SPIKE VIAL GLASS SM (MISCELLANEOUS) ×2 IMPLANT
DRAPE BACK TABLE (DRAPES) ×2 IMPLANT
DRAPE EXTREMITY T 121X128X90 (DRAPE) ×2 IMPLANT
DRAPE U-SHAPE 47X51 STRL (DRAPES) ×2 IMPLANT
DRESSING ALLEVYN BORDER HEEL (GAUZE/BANDAGES/DRESSINGS) ×1 IMPLANT
DRSG MEPILEX BORDER 4X12 (GAUZE/BANDAGES/DRESSINGS) ×2 IMPLANT
DURAPREP 26ML APPLICATOR (WOUND CARE) ×2 IMPLANT
ELECT REM PT RETURN 9FT ADLT (ELECTROSURGICAL) ×2
ELECTRODE REM PT RTRN 9FT ADLT (ELECTROSURGICAL) ×1 IMPLANT
FACESHIELD LNG OPTICON STERILE (SAFETY) ×2 IMPLANT
GLOVE BIOGEL PI IND STRL 7.0 (GLOVE) IMPLANT
GLOVE BIOGEL PI IND STRL 8.5 (GLOVE) IMPLANT
GLOVE BIOGEL PI INDICATOR 7.0 (GLOVE) ×2
GLOVE BIOGEL PI INDICATOR 8.5 (GLOVE) ×1
GLOVE ECLIPSE 6.5 STRL STRAW (GLOVE) ×2 IMPLANT
GLOVE ECLIPSE 8.0 STRL XLNG CF (GLOVE) ×2 IMPLANT
GLOVE EXAM NITRILE MD LF STRL (GLOVE) ×2 IMPLANT
GLOVE OPTIFIT SS 8.0 STRL (GLOVE) ×2 IMPLANT
GLOVE SKINSENSE NS SZ8.0 LF (GLOVE) ×2
GLOVE SKINSENSE STRL SZ8.0 LF (GLOVE) ×2 IMPLANT
GLOVE SS N UNI LF 8.5 STRL (GLOVE) ×2 IMPLANT
GOWN BRE IMP SLV AUR XL STRL (GOWN DISPOSABLE) ×7 IMPLANT
GOWN STRL REIN XL XLG (GOWN DISPOSABLE) ×2 IMPLANT
HANDPIECE INTERPULSE COAX TIP (DISPOSABLE) ×2
HOOD W/PEELAWAY (MISCELLANEOUS) ×10 IMPLANT
INST SET MAJOR BONE (KITS) ×2 IMPLANT
IV NS IRRIG 3000ML ARTHROMATIC (IV SOLUTION) ×2 IMPLANT
KIT BLADEGUARD II DBL (SET/KITS/TRAYS/PACK) ×2 IMPLANT
KIT ROOM TURNOVER APOR (KITS) ×2 IMPLANT
MANIFOLD NEPTUNE II (INSTRUMENTS) ×2 IMPLANT
MARKER SKIN DUAL TIP RULER LAB (MISCELLANEOUS) ×2 IMPLANT
NEEDLE HYPO 21X1.5 SAFETY (NEEDLE) ×2 IMPLANT
NS IRRIG 1000ML POUR BTL (IV SOLUTION) ×2 IMPLANT
PACK TOTAL JOINT (CUSTOM PROCEDURE TRAY) ×2 IMPLANT
PAD ARMBOARD 7.5X6 YLW CONV (MISCELLANEOUS) ×2 IMPLANT
PAD DANNIFLEX CPM (ORTHOPEDIC SUPPLIES) ×2 IMPLANT
PAIN PUMP ON-Q 270MLX5ML 2.5IN (PAIN MANAGEMENT) ×1 IMPLANT
PIN TROCAR 3 INCH (PIN) ×2 IMPLANT
SET BASIN LINEN APH (SET/KITS/TRAYS/PACK) ×2 IMPLANT
SET HNDPC FAN SPRY TIP SCT (DISPOSABLE) ×1 IMPLANT
SPONGE GAUZE 4X4 12PLY (GAUZE/BANDAGES/DRESSINGS) IMPLANT
STAPLER VISISTAT 35W (STAPLE) ×2 IMPLANT
SUT BRALON NAB BRD #1 30IN (SUTURE) ×6 IMPLANT
SUT MON AB 0 CT1 (SUTURE) ×4 IMPLANT
SUT MON AB 2-0 CT1 36 (SUTURE) ×3 IMPLANT
SYR 30ML LL (SYRINGE) ×2 IMPLANT
SYR BULB IRRIGATION 50ML (SYRINGE) ×2 IMPLANT
TOWEL OR 17X26 4PK STRL BLUE (TOWEL DISPOSABLE) ×2 IMPLANT
TOWER CARTRIDGE SMART MIX (DISPOSABLE) ×2 IMPLANT
TRAY FOLEY CATH 14FR (SET/KITS/TRAYS/PACK) ×2 IMPLANT
WATER STERILE IRR 1000ML POUR (IV SOLUTION) ×8 IMPLANT
YANKAUER SUCT 12FT TUBE ARGYLE (SUCTIONS) ×2 IMPLANT

## 2010-09-09 NOTE — Anesthesia Preprocedure Evaluation (Addendum)
Anesthesia Evaluation  Name, MR# and DOB Patient awake  General Assessment Comment  Reviewed: Allergy & Precautions, H&P , NPO status , Patient's Chart, lab work & pertinent test results  History of Anesthesia Complications (+) PONV  Airway Mallampati: II  Neck ROM: Full    Dental  (+) Teeth Intact   Pulmonary  clear to auscultation  breath sounds clear to auscultation none    Cardiovascular Regular Normal    Neuro/Psych   GI/Hepatic/Renal negative GI ROS            Endo/Other    Abdominal   Musculoskeletal   Hematology   Peds  Reproductive/Obstetrics    Anesthesia Other Findings             Anesthesia Physical Anesthesia Plan  ASA: I  Anesthesia Plan: Spinal   Post-op Pain Management:    Induction:   Airway Management Planned: Nasal Cannula  Additional Equipment:   Intra-op Plan:   Post-operative Plan:   Informed Consent: I have reviewed the patients History and Physical, chart, labs and discussed the procedure including the risks, benefits and alternatives for the proposed anesthesia with the patient or authorized representative who has indicated his/her understanding and acceptance.     Plan Discussed with:   Anesthesia Plan Comments:         Anesthesia Quick Evaluation

## 2010-09-09 NOTE — Transfer of Care (Signed)
  Anesthesia Post-op Note  Patient: [name]  Procedure(s) Performed:  TOTAL KNEE ARTHROPLASTY - With DePuy  Patient Location: PACU  Anesthesia Type: Spinal  Level of Consciousness: awake, alert  and oriented  Airway and Oxygen Therapy: Patient Spontanous Breathing and Patient connected to face mask oxygen  Post-op Pain: none  Post-op Assessment: Post-op Vital signs reviewed, Patient's Cardiovascular Status Stable and Respiratory Function Stable  Post-op Vital Signs: stable  Complications: No apparent anesthesia complications   Spinal level T12

## 2010-09-09 NOTE — Progress Notes (Addendum)
Pt confirmed that Dr. Romeo Apple is performing Total knee replacement on left knee. Bmet redrawn and sent STAT to lab.

## 2010-09-09 NOTE — Interval H&P Note (Signed)
History and Physical Interval Note:   09/09/2010   7:21 AM   [name]  has presented today for surgery, with the diagnosis of osteoarthritis left knee  The various methods of treatment have been discussed with the patient and family. After consideration of risks, benefits and other options for treatment, the patient has consented to  Procedure(s):LEFT TOTAL KNEE ARTHROPLASTY as a surgical intervention .  I have reviewed the patients' chart and labs.  Questions were answered to the patient's satisfaction.     Fuller Canada  MD

## 2010-09-09 NOTE — Op Note (Signed)
Preop diagnosis osteoarthritis LEFT knee Postop diagnosis same Procedure LEFT total knee arthroplasty Surgeon Romeo Apple Assisted by [facility] AND BETTY ASHLEY Anesthesia spinal Findings SEVERE OA MEDIAL AND PTF JOINT  Tourniquet time 90 minutes, pressure 300 mm of mercury  Indications for procedure disabling knee pain, failure to control pain with nonoperative measures  Details of procedure:  In the preop area the patient's knee was marked and countersigned by the surgeon, the chart was updated, consent was signed  The patient was taken to the operating room for spinal anesthetic followed by administering 2 g of Ancef based on weight of >80 kg  A Foley catheter was inserted sterilely, then the operative extremity LEFT was prepped and draped sterilely  The timeout was completed  The limb was then exsanguinated with a six-inch Esmarch placed in flexion and the tourniquet was elevated to 300 mm of mercury. A midline incision was made, the subcutaneous tissues were divided down to the extensor mechanism. A medial arthrotomy was performed patella was everted the fat pad was resected. The medial and  lateral menisci were resected. The medial soft tissue sleeve was elevated to the mid coronal plane. The anterior cruciate ligament and PCL were resected. Osteophytes were removed. The distal femur anterior surface was skeletonized with sharp dissection.  A three-eighths inch drill bit was used to enter the femoral canal which was suctioned and irrigated until clear and an intramedullary rod was placed in the femur with a 5 LEFT setting, the block was pinned in place; then an 10 mm distal femoral resection was performed. The cut was checked for flatness.  The sizing guide was then placed on the femur, the femur  measured a size 4; the block was pinned in external rotation 3 using the epicondyles as reference; a  4-in-1 cutting block was placed and the 4 cuts were made with retractors protecting  the collateral ligaments. The posterior osteophytes were removed with a curved osteotome. Residual PCL tissue was resected; residual meniscal tissue was resected.  The external tibial alignment instrument was set for tibial resection. The guide was   placed referencing the medial side which was the worn side and at the stylus was set at 2 mm resection. Anterior slope was built in to match the patients anatomy; a neutral varus valgus cut was set using the medial third of the tibial tubercle as reference along with the medial portion of the lateral tibial spine. The was stabilized with pins and  a saw was used to resect the anterior tibia. The tibia was sized with a size 3 base plate,    We then placed spacer blocks using a 10;  the knee was balanced  in extension and was balanced in flexion with the 10 spacer block   The box cut was then done using the box cutting guide. We then turned our attention to the patella.  The patella measured 23 mm we set the guide to leave 16 mm of patella.  After resection of the patella,  It was remeasured, and measured 15 mm. The size was 35. We then drilled the 3 peg holes.  We then did a trial reduction. The trial reduction was excellent with full extension, balanced in extension, balance in flexion. And passive flexion of 105, patella normal tracking.    We then punched the tibia per technique.   The bone was then irrigated and dried while cement was mixed on the back table. The implants were checked for accuracy and then cemented in  place; excess cement was removed; the cement was allowed to cure. The wound was then irrigated with copious amounts of saline, the posterior capsule was injected with 30 cc of Marcaine with epinephrine followed by 30 cc in the JOINT. The 10 insert was placed. Range of motion matched trial reduction  The capsule was closed with #1 Bralon in interrupted and running fashion and then the joint was injected with 30 cc of Marcaine with  epinephrine.  The subcutaneous pain pump was placed.  The subcutaneous tissues were closed with 0 Monocryl in running fashion  The staples were used to reapproximate the skin edges.  Sterile dressings were applied. A radiograph was obtained. Cryo/Cuff was placed and activated.  The patient was then taken to the recovery room in stable condition.  Routine postop plan for knee replacement.

## 2010-09-09 NOTE — Anesthesia Postprocedure Evaluation (Addendum)
Anesthesia Post Note  Patient: [name]  Procedure(s) Performed:  TOTAL KNEE ARTHROPLASTY - With DePuy  Anesthesia type: General  Patient location: PACU  Post pain: Pain level controlled  Post assessment: Post-op Vital signs reviewed, Patient's Cardiovascular Status Stable and Respiratory Function Stable  Last Vitals:  Filed Vitals:   09/09/10 1021  BP: 103/50  Pulse:   Temp: 98.7 F (37.1 C)  Resp: 10    Post vital signs: stable  Level of consciousness: awake, alert  and oriented  Complications: No apparent anesthesia complications 09/10/10  1120 :  Follow up visit.  VSS.  Denies backpain, HA and sensation to lower extremities normal.  No apparent anesthesia complications. Barbette Merino, CRNA

## 2010-09-09 NOTE — H&P (View-Only) (Signed)
[name] is an 60 y.o. female.   Chief Complaint: left knee pain  HPI: This patient is 60 years old. He had a RIGHT total knee arthroplasty in 2007. She had a LEFT total hip replacement approximately 2 years ago presents now with complaints of severe dull, aching pain, which has become constant in the LEFT knee. Her x-rays show degenerative arthritis of the LEFT knee. She would like to proceed with a LEFT total knee replacement. She understands the risks and benefits of the surgery and understands that nonoperative treatment is still an option.    Past Medical History  Diagnosis Date  . Arthritis   . High cholesterol     Past Surgical History  Procedure Date  . Joint replacement     right knee  . Left hip replacement   . Ovary removed   . Hernia removed     x2    Family History  Problem Relation Age of Onset  . Arthritis     Social History:  reports that she has never smoked. She does not have any smokeless tobacco history on file. She reports that she does not drink alcohol or use illicit drugs.  Allergies: No Known Allergies  Medications Prior to Admission  Medication Sig Dispense Refill  . aspirin 81 MG tablet Take 81 mg by mouth daily.        . Calcium Carbonate-Vitamin D (CALCIUM + D PO) Take by mouth.        . fish oil-omega-3 fatty acids 1000 MG capsule Take 2 g by mouth daily.        . Flaxseed, Linseed, (FLAX SEED OIL PO) Take by mouth.        . PRAVASTATIN SODIUM PO Take by mouth.         No current facility-administered medications on file as of 08/15/2010.    No results found for this or any previous visit (from the past 48 hour(s)). @RISRSLT48@  Review of Systems  Constitutional: Negative.   HENT: Negative.   Eyes: Negative.   Respiratory: Negative.   Cardiovascular: Negative.   Gastrointestinal: Negative.   Genitourinary: Negative.   Skin: Negative.   Neurological: Negative.   Endo/Heme/Allergies: Negative.   Psychiatric/Behavioral: Negative.      There were no vitals taken for this visit. Physical Exam  Vital signs are stable as recorded  General appearance is normal  The patient is alert and oriented x3  The patient's mood and affect are normal  Gait assessment: She has an abnormal gait pattern associated with the previous joint replacement and the pain in the LEFT knee The cardiovascular exam reveals normal pulses and temperature without edema swelling.  The lymphatic system is negative for palpable lymph nodes  The sensory exam is normal.  There are no pathologic reflexes.  Balance is normal.   Exam of the LEFT knee Inspection Medial joint line tenderness, mild joint swelling Range of motion Minimal flexion contracture, arc and flexion is 115 Stability Of the ligaments tested normal Strength Normal Skin Normal  Upper extremity exam  Inspection and palpation revealed no abnormalities in the upper extremities.  Range of motion is full without contracture.  Motor exam is normal with grade 5 strength.  The joints are fully reduced without subluxation.  There is no atrophy or tremor and muscle tone is normal.  All joints are stable.   RLE exam knee flexion is 120 knee is stable ligs are stable no tenderness    Assessment/Plan  XRAYS   3 views, LEFT knee were done in May. The alignment of the knee has slight varus. There is notable degenerative change in the medial and patellofemoral compartments with minimal osteophytes. Impression osteoarthritis with mild deformity, LEFT knee.  OA LEFT KNEE   LEFT TKA DEPUY  Stanley [place] 08/15/2010, 9:29 AM    

## 2010-09-09 NOTE — Anesthesia Procedure Notes (Addendum)
Spinal Block  Patient location during procedure: OR Start time: 09/09/2010 8:09 AM Staffing Anesthesiologist: Laurene Footman CRNA/Resident: Glynn Octave Preanesthetic Checklist Completed: patient identified, site marked, surgical consent, pre-op evaluation, timeout performed, IV checked, risks and benefits discussed and monitors and equipment checked Spinal Block Patient position: left lateral decubitus Prep: Betadine Patient monitoring: heart rate, cardiac monitor, continuous pulse ox and blood pressure Approach: left paramedian Location: L3-4 Injection technique: single-shot Needle Needle type: Spinocan  Needle gauge: 22 G Needle length: 12.7 cm Assessment Sensory level: T6 Additional Notes CRNA attempted x2 without success.  Dr. Jayme Cloud called in.  Marcaine .75%, 2cc, fentanyl and epi. .1cc injected at 0809.  Level T6.  Tray #[id number], Exp. Date 10/2010.

## 2010-09-09 NOTE — Brief Op Note (Signed)
09/09/2010  10:26 AM  PATIENT:  Nancee Burnell Blanks  60 y.o. female  PRE-OPERATIVE DIAGNOSIS:  Osteoarthritis Left Knee  POST-OPERATIVE DIAGNOSIS:  Osteoarthritis Left Knee  PROCEDURE:  Procedure(s):LEFT TOTAL KNEE ARTHROPLASTY  DEPUY PS FIXED BEARING, 4N-FEMUR, 3T, 10 POLY, 35 PATELLA   SURGEON:  Surgeon(s): Fuller Canada, MD  PHYSICIAN ASSISTANT:   ASSISTANTS: Wayne Mcfatter and [place]   ANESTHESIA:   spinal  ESTIMATED BLOOD LOSS: * No blood loss amount entered *   BLOOD ADMINISTERED:none  DRAINS: none   LOCAL MEDICATIONS USED:  MARCAINE 90CC  SPECIMEN:  No Specimen  DISPOSITION OF SPECIMEN:  N/A  COUNTS:  YES  TOURNIQUET:   Total Tourniquet Time Documented: Thigh (Left) - 90 minutes  DICTATION #:   PLAN OF CARE: PACU then floor   PATIENT DISPOSITION:  PACU - hemodynamically stable.   Delay start of Pharmacological VTE agent (>24hrs) due to surgical blood loss or risk of bleeding:  yes

## 2010-09-09 NOTE — Progress Notes (Signed)
Flax Seed Oil Capsules ordered from patient medication which is herbal medication and per hospital policy not continued while admitted. Medication has been discontinued per policy and can be restarted when discharged. [name] 09/09/2010

## 2010-09-10 LAB — BASIC METABOLIC PANEL
CO2: 25 mEq/L (ref 19–32)
Calcium: 9 mg/dL (ref 8.4–10.5)
Chloride: 97 mEq/L (ref 96–112)
Creatinine, Ser: 0.71 mg/dL (ref 0.50–1.10)
Glucose, Bld: 162 mg/dL — ABNORMAL HIGH (ref 70–99)

## 2010-09-10 LAB — CBC
Hemoglobin: 11.9 g/dL — ABNORMAL LOW (ref 12.0–15.0)
MCH: 28.7 pg (ref 26.0–34.0)
MCV: 87.2 fL (ref 78.0–100.0)
Platelets: 225 10*3/uL (ref 150–400)
RBC: 4.14 MIL/uL (ref 3.87–5.11)
WBC: 11.9 10*3/uL — ABNORMAL HIGH (ref 4.0–10.5)

## 2010-09-10 MED ORDER — SODIUM CHLORIDE 0.9 % IJ SOLN
INTRAMUSCULAR | Status: AC
  Filled 2010-09-10: qty 3

## 2010-09-10 NOTE — Progress Notes (Signed)
Encounter addended by: Glynn Octave on: 09/10/2010 11:22 AM<BR>     Documentation filed: Notes Section
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
## 2010-09-10 NOTE — Progress Notes (Signed)
UR Chart Review Completed  

## 2010-09-10 NOTE — Progress Notes (Signed)
Subjective: 1 Day Post-Op Procedure(s) (LRB): TOTAL KNEE ARTHROPLASTY (Left) Patient reports pain as moderate.    Objective: Vital signs in last 24 hours: Temp:  [97.3 F (36.3 C)-98.7 F (37.1 C)] 97.8 F (36.6 C) (08/28 0515) Pulse Rate:  [58-78] 78  (08/28 0515) Resp:  [10-20] 18  (08/28 0515) BP: (95-159)/(50-77) 143/77 mmHg (08/28 0515) SpO2:  [95 %-100 %] 99 % (08/28 0515)  Intake/Output from previous day: 08/27 0701 - 08/28 0700 In: 1180 [P.O.:180; I.V.:1000] Out: 1375 [Urine:1325; Blood:50] Intake/Output this shift:     Basename 09/10/10 0504  HGB 11.9*    Basename 09/10/10 0504  WBC 11.9*  RBC 4.14  HCT 36.1  PLT 225    Basename 09/10/10 0504 09/09/10 0636  NA 131* 137  K 4.3 3.9  CL 97 101  CO2 25 27  BUN 12 15  CREATININE 0.71 0.76  GLUCOSE 162* 119*  CALCIUM 9.0 9.4   No results found for this basename: LABPT:2,INR:2 in the last 72 hours  stable neurovascular exam intact  Assessment/Plan: 1 Day Post-Op Procedure(s) (LRB): TOTAL KNEE ARTHROPLASTY (Left) Advance diet Up with therapy D/C IV fluids  [name] 09/10/2010, 8:16 AM

## 2010-09-10 NOTE — Progress Notes (Signed)
During shift assessment no IV access was noted.  Patient refused IV restart.  Will leave note for MD to advise.
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
## 2010-09-11 LAB — CBC
HCT: 33.2 % — ABNORMAL LOW (ref 36.0–46.0)
MCH: 28.6 pg (ref 26.0–34.0)
MCV: 85.6 fL (ref 78.0–100.0)
RBC: 3.88 MIL/uL (ref 3.87–5.11)
RDW: 13.8 % (ref 11.5–15.5)
WBC: 11.5 10*3/uL — ABNORMAL HIGH (ref 4.0–10.5)

## 2010-09-11 NOTE — Progress Notes (Signed)
Subjective: 2 Days Post-Op Procedure(s) (LRB): TOTAL KNEE ARTHROPLASTY (Left) Patient reports pain as 4 on 0-10 scale.    Objective: Vital signs in last 24 hours: Temp:  [98 F (36.7 C)-99.4 F (37.4 C)] 98 F (36.7 C) (08/29 0612) Pulse Rate:  [78-104] 85  (08/29 0612) Resp:  [20] 20  (08/29 0612) BP: (127-158)/(69-91) 127/69 mmHg (08/29 0612) SpO2:  [94 %-99 %] 95 % (08/29 0612)  Intake/Output from previous day: 08/28 0701 - 08/29 0700 In: 480 [P.O.:480] Out: 2325 [Urine:2325] Intake/Output this shift: I/O this shift: In: 460 [P.O.:460] Out: -    Basename 09/11/10 0442 09/10/10 0504  HGB 11.1* 11.9*    Basename 09/11/10 0442 09/10/10 0504  WBC 11.5* 11.9*  RBC 3.88 4.14  HCT 33.2* 36.1  PLT 249 225    Basename 09/10/10 0504 09/09/10 0636  NA 131* 137  K 4.3 3.9  CL 97 101  CO2 25 27  BUN 12 15  CREATININE 0.71 0.76  GLUCOSE 162* 119*  CALCIUM 9.0 9.4   No results found for this basename: LABPT:2,INR:2 in the last 72 hours  Neurologically intact Neurovascular intact Sensation intact distally Intact pulses distally Dorsiflexion/Plantar flexion intact  Assessment/Plan: 2 Days Post-Op Procedure(s) (LRB): TOTAL KNEE ARTHROPLASTY (Left) Advance diet Up with therapy D/C IV fluids  Fuller Canada 09/11/2010, 1:32 PM
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
## 2010-09-11 NOTE — Consult Note (Signed)
CSW presented bed offers to pt and she chooses [facility] Facility notified. Awaiting [employer] authorization.  [name]
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
## 2010-09-12 LAB — CBC
HCT: 31.3 % — ABNORMAL LOW (ref 36.0–46.0)
Hemoglobin: 10.4 g/dL — ABNORMAL LOW (ref 12.0–15.0)
MCH: 28.5 pg (ref 26.0–34.0)
MCHC: 33.2 g/dL (ref 30.0–36.0)
RDW: 13.9 % (ref 11.5–15.5)

## 2010-09-12 MED ORDER — HYDROCODONE-ACETAMINOPHEN 10-325 MG PO TABS
1.0000 | ORAL_TABLET | ORAL | Status: DC | PRN
  Administered 2010-09-12 – 2010-09-13 (×2): 1 via ORAL
  Filled 2010-09-12 (×2): qty 1

## 2010-09-12 NOTE — Progress Notes (Signed)
Subjective: 3 Days Post-Op Procedure(s) (LRB): TOTAL KNEE ARTHROPLASTY (Left) Patient reports pain as 5 on 0-10 scale.    Objective: Vital signs in last 24 hours: Temp:  [97.8 F (36.6 C)-98.7 F (37.1 C)] 98.3 F (36.8 C) (08/30 0544) Pulse Rate:  [74-90] 85  (08/30 0544) Resp:  [16-18] 18  (08/30 0544) BP: (124-145)/(72-80) 124/74 mmHg (08/30 0544) SpO2:  [92 %-96 %] 95 % (08/30 0544)  Intake/Output from previous day: 08/29 0701 - 08/30 0700 In: 1040 [P.O.:1040] Out: -  Intake/Output this shift:     Basename 09/12/10 0442 09/11/10 0442 09/10/10 0504  HGB 10.4* 11.1* 11.9*    Basename 09/12/10 0442 09/11/10 0442  WBC 10.5 11.5*  RBC 3.65* 3.88  HCT 31.3* 33.2*  PLT 229 249    Basename 09/10/10 0504  NA 131*  K 4.3  CL 97  CO2 25  BUN 12  CREATININE 0.71  GLUCOSE 162*  CALCIUM 9.0   No results found for this basename: LABPT:2,INR:2 in the last 72 hours  Neurologically intact Neurovascular intact Sensation intact distally Intact pulses distally Dorsiflexion/Plantar flexion intact Incision: scant drainage  Assessment/Plan: 3 Days Post-Op Procedure(s) (LRB): TOTAL KNEE ARTHROPLASTY (Left) Up with therapy Plan for discharge tomorrow  Fuller Canada 09/12/2010, 7:55 AM
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
## 2010-09-13 ENCOUNTER — Encounter (HOSPITAL_COMMUNITY): Payer: Self-pay | Admitting: Orthopedic Surgery

## 2010-09-13 ENCOUNTER — Inpatient Hospital Stay
Admission: RE | Admit: 2010-09-13 | Discharge: 2010-09-23 | Disposition: A | Discharge status: Home / Self Care | Payer: Medicare Other | Source: Ambulatory Visit | Attending: Internal Medicine | Admitting: Internal Medicine

## 2010-09-13 MED ORDER — HYDROCODONE-ACETAMINOPHEN 10-325 MG PO TABS: 1.0000 | ORAL_TABLET | ORAL | Status: AC | PRN

## 2010-09-13 MED ORDER — BISACODYL 5 MG PO TBEC: 10.0000 mg | DELAYED_RELEASE_TABLET | Freq: Every day | ORAL | Status: AC | PRN

## 2010-09-13 MED ORDER — METHOCARBAMOL 500 MG PO TABS: 500.0000 mg | ORAL_TABLET | Freq: Four times a day (QID) | ORAL | Status: AC | PRN

## 2010-09-13 MED ORDER — ASPIRIN 325 MG PO TBEC: 325.0000 mg | DELAYED_RELEASE_TABLET | Freq: Two times a day (BID) | ORAL | Status: AC

## 2010-09-13 NOTE — Consult Note (Signed)
Pt to D/C today to [facility].  Spoke with Pt and staff at facility.  Both are in agreement with plan.  CSW to sign of at this time.

## 2010-09-13 NOTE — Discharge Summary (Signed)
Physician Discharge Summary  Patient ID: [name] MRN: [id number] DOB/AGE: 60/08/1950 60 y.o.  Admit date: 09/09/2010 Discharge date: 09/13/2010  Admission Diagnoses: Osteoarthritis left knee  Discharge Diagnoses: Osteoarthritis left knee Active Problems:  * No active hospital problems. *    Discharged Condition: good  Hospital Course: The patient was admitted on August 31 for a left total knee arthroplasty. This was tolerated well with no complications. Her pain was eventually controlled with oral medications using hydrocodone 10 mg and occasional oxycodone immediate release 5-10 mg as needed. She tolerated physical therapy well advanced as appropriate. There were no complications.  Consults: none  Significant Diagnostic Studies: labs: Hemoglobin 10  Treatments:   Discharge Exam: Blood pressure 137/77, pulse 81, temperature 98.1 F (36.7 C), temperature source Oral, resp. rate 18, SpO2 94.00%. Incision/Wound: clean with scant drainage  Disposition:   Discharge Orders    Future Appointments: Nicolena Schurman: Department: Dept Phone: Center:   09/23/2010 3:00 PM Fuller Canada, MD [facility] [phone] [facility]     Future Orders Please Complete By Expires   Diet - low sodium heart healthy      Call MD / Call 911      Comments:   If you experience chest pain or shortness of breath, CALL 911 and be transported to the hospital emergency room.  If you develope a fever above 101 F, pus (white drainage) or increased drainage or redness at the wound, or calf pain, call your surgeon's office.   Constipation Prevention      Comments:   Drink plenty of fluids.  Prune juice may be helpful.  You may use a stool softener, such as Colace (over the counter) 100 mg twice a day.  Use MiraLax (over the counter) for constipation as needed.   Increase activity slowly as tolerated      Weight Bearing as taught in Physical Therapy      Comments:   Use a walker or crutches as instructed.   CPM      Comments:   Continuous passive motion machine (CPM):      Use the CPM from 0 to 75 for 6 hours per day.      You may increase by 10 per day.  You may break it up into 2 or 3 sessions per day.      Use CPM for 3 weeks or until you are told to stop.   TED hose      Comments:   Use stockings (TED hose) for 5 weeks on both leg(s).  You may remove them at night for sleeping.   Change dressing      Comments:   Change dressing on friday, then change the dressing daily with sterile 4 x 4 inch gauze dressing and apply TED hose.  You may clean the incision with alcohol prior to redressing.   Do not put a pillow under the knee. Place it under the heel.      Driving restrictions      Comments:   No driving for 2 weeks   Lifting restrictions      Comments:   No lifting for 12 weeks   Discharge instructions      Comments:   DVT PREVENTION: TED HOSE AND ASPIRIN X 6 WEEKS (START FROM SURGERY DATE)  CPM 3 WEEKS        Current Discharge Medication List    START taking these medications   Details  aspirin EC 325 MG EC tablet Take 1  tablet (325 mg total) by mouth 2 (two) times daily. Qty: 70 tablet, Refills: 0    bisacodyl (DULCOLAX) 5 MG EC tablet Take 2 tablets (10 mg total) by mouth daily as needed for constipation. Qty: 30 tablet, Refills: 0    HYDROcodone-acetaminophen (NORCO) 10-325 MG per tablet Take 1 tablet by mouth every 4 (four) hours as needed. Qty: 60 tablet, Refills: 5    methocarbamol (ROBAXIN) 500 MG tablet Take 1 tablet (500 mg total) by mouth every 6 (six) hours as needed.      CONTINUE these medications which have NOT CHANGED   Details  Calcium Carb-Cholecalciferol 600-400 MG-UNIT TABS Take 2 tablets by mouth daily.      fish oil-omega-3 fatty acids 1000 MG capsule Take 3 capsules by mouth daily.     Flaxseed, Linseed, (FLAX SEED OIL PO) Take 3 capsules by mouth daily. Hold while in hospital    pravastatin (PRAVACHOL) 80 MG tablet Take 80 mg by mouth at  bedtime.      Psyllium (METAMUCIL PO) Take 1 packet by mouth daily.       STOP taking these medications     aspirin 81 MG tablet      Calcium Carbonate-Vitamin D (CALCIUM + D PO)      methylPREDNISolone acetate (DEPO-MEDROL) 40 MG/ML injection        Follow-up Information    Follow up with Fuller Canada, MD on 09/23/2010.   Contact information:   [address], [address] [place] [address] [phone]          Signed: Fuller Canada 09/13/2010, 8:47 AM

## 2010-09-13 NOTE — Progress Notes (Signed)
UR Chart Review Completed  
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
## 2010-09-13 NOTE — Progress Notes (Signed)
Writer called report to Quenten Raven, RN at [facility].  Pt wheel chaired over in stable condition by staff member.  Packet took over to [facility] for nursing staff.   Pt took all belongings and in no distress a time of discharge.

## 2010-09-17 LAB — TYPE AND SCREEN
Antibody Screen: NEGATIVE
Unit division: 0

## 2010-09-23 ENCOUNTER — Encounter: Payer: Self-pay | Admitting: Orthopedic Surgery

## 2010-09-23 ENCOUNTER — Ambulatory Visit (INDEPENDENT_AMBULATORY_CARE_PROVIDER_SITE_OTHER): Payer: Medicare Other | Admitting: Orthopedic Surgery

## 2010-09-23 DIAGNOSIS — Z96659 Presence of unspecified artificial knee joint: Secondary | ICD-10-CM | POA: Insufficient documentation

## 2010-09-23 NOTE — Progress Notes (Signed)
   Postop visit status post total knee arthroplasty Date of surgery  August 27 Diagnosis  Osteoarthritis Operative findings Severe osteoarthritis medial and patellofemoral joint Implant Depuy  posterior stabilized total knee replacement DVT prophylaxis Ecotrin twice a day with TED hose for 6 weeks  Living situation Going home today from the [facility] Complaints None Exam Wound intact clean dry no redness or drainage Plan Home physical therapy come back 4 weeks okay to drive

## 2010-10-10 ENCOUNTER — Telehealth: Payer: Self-pay | Admitting: Orthopedic Surgery

## 2010-10-10 ENCOUNTER — Other Ambulatory Visit: Payer: Self-pay | Admitting: Orthopedic Surgery

## 2010-10-10 DIAGNOSIS — Z96659 Presence of unspecified artificial knee joint: Secondary | ICD-10-CM

## 2010-10-10 NOTE — Telephone Encounter (Signed)
Ok

## 2010-10-10 NOTE — Telephone Encounter (Signed)
[name], did yo get this?  Thanks

## 2010-10-10 NOTE — Telephone Encounter (Signed)
[name] will be finished with in home therapy today, needs an order for outpatient therapy sent to [facility]

## 2010-10-16 ENCOUNTER — Ambulatory Visit (HOSPITAL_COMMUNITY)
Admission: RE | Admit: 2010-10-16 | Discharge: 2010-10-16 | Disposition: A | Discharge status: Home / Self Care | Payer: Medicare Other | Source: Ambulatory Visit | Attending: Orthopedic Surgery | Admitting: Orthopedic Surgery

## 2010-10-16 DIAGNOSIS — M6281 Muscle weakness (generalized): Secondary | ICD-10-CM | POA: Insufficient documentation

## 2010-10-16 DIAGNOSIS — IMO0001 Reserved for inherently not codable concepts without codable children: Secondary | ICD-10-CM | POA: Insufficient documentation

## 2010-10-16 DIAGNOSIS — R262 Difficulty in walking, not elsewhere classified: Secondary | ICD-10-CM | POA: Insufficient documentation

## 2010-10-16 DIAGNOSIS — M25569 Pain in unspecified knee: Secondary | ICD-10-CM | POA: Insufficient documentation

## 2010-10-16 DIAGNOSIS — M25669 Stiffness of unspecified knee, not elsewhere classified: Secondary | ICD-10-CM | POA: Insufficient documentation
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
## 2010-10-18 ENCOUNTER — Ambulatory Visit (HOSPITAL_COMMUNITY)
Admission: RE | Admit: 2010-10-18 | Discharge: 2010-10-18 | Disposition: A | Discharge status: Home / Self Care | Payer: Medicare Other | Source: Ambulatory Visit | Attending: Family Medicine | Admitting: Family Medicine

## 2010-10-18 NOTE — Progress Notes (Signed)
Physical Therapy Treatment Patient Details  Name: CHAZ RONNING MRN: [id number] Date of Birth: 06/17/1950  Today's Date: 10/18/2010 Time: 0802-0904 Time Calculation (min): 62 min Visit#: 2  of 12   Re-eval: 11/15/10  Charge: therex 54 min  Subjective: Symptoms/Limitations Symptoms: Pt stated she feeling better, has pain getting out of car but would not rate a pain scale today.  Have been completeing the HEP given Pain Assessment Currently in Pain?: No/denies  Objective: previous THR followed precautions with upright bike.  Exercise/Treatments Aerobic Stationary Bike: Upright Schwinn bike 6" Stretches Active Hamstring Stretch: 3 reps;30 seconds Quad Stretch: 30 seconds Standing Heel Raises: 10 reps Lateral Step Up: 10 reps;Step Height: 4" Forward Step Up: 10 reps;Step Height: 4" Functional Squat: 10 reps;5 seconds SLS: 29" max of 3 Other Standing Knee Exercises: Toe Raise 10 reps Other Standing Knee Exercises: Tandm stance 1x 30" each Supine Quad Sets: 10 reps;Limitations Quad Sets Limitations: 5" holds Short Arc Quad Sets: 10 reps;Limitations Short Arc Quad Sets Limitations: 5" holds Heel Slides: 10 reps Prone  Hamstring Curl: 10 reps Hip Extension: 10 reps   Physical Therapy Assessment and Plan PT Assessment and Plan Clinical Impression Statement: Began tx with standup bike rocking secondary to previous THR.  Added therex per PT plan with multimodal cueing for proper tech with lat step up for effective quad control.  Pt completed all therex correctly with no c/o.  Pt plans on iceing knee when home. PT Plan: Continue with current POC, progress strength and balance    Goals    Problem List Patient Active Problem List  Diagnoses  . DEGENERATIVE JOINT DISEASE, LEFT HIP  . KNEE PAIN  . TOTAL HIP FOLLOW-UP  . TOTAL KNEE FOLLOW-UP  . Acquired trigger finger  . OA (osteoarthritis) of knee  . S/P total knee replacement  . Difficulty in walking  . Knee stiffness   . Knee pain    PT - End of Session Activity Tolerance: Patient tolerated treatment well General Behavior During Session: [facility] for tasks performed Cognition: [facility] for tasks performed  Juel Burrow 10/18/2010, 9:17 AM

## 2010-10-21 ENCOUNTER — Ambulatory Visit (HOSPITAL_COMMUNITY)
Admission: RE | Admit: 2010-10-21 | Discharge: 2010-10-21 | Disposition: A | Discharge status: Home / Self Care | Payer: Medicare Other | Source: Ambulatory Visit | Attending: Orthopedic Surgery | Admitting: Orthopedic Surgery

## 2010-10-21 NOTE — Progress Notes (Signed)
Physical Therapy Treatment/Progress Note to MD Patient Details  Name: [name] MRN: [id number] Date of Birth: 31-Oct-1950  Today's Date: 10/21/2010 Time: 4098-1191 Time Calculation (min): 48 min Charges: 10' Manual, 73' TE Visit#: 3  of 12   Re-eval: 11/15/10    Subjective: Symptoms/Limitations Symptoms: Pt reports that she is doing pretty well.  She took her pain medication this morning and is not having any pain so far. Pain Assessment Currently in Pain?: No/denies  Precautions/Restrictions     Mobility (including Balance)       Exercise/Treatments Stretches Quad Stretch: 3 reps;30 seconds Aerobic Stationary Bike: Upright Schwinn bike 6" for ROM Machines for Strengthening Cybex Knee Extension: 3.5 PL x10 Plyometrics   Standing Heel Raises: 15 reps Knee Flexion: Left;15 reps Terminal Knee Extension: Left;10 reps;Theraband;Limitations Theraband Level (Terminal Knee Extension): Level 4 (Blue) Terminal Knee Extension Limitations: 10sec hold Lateral Step Up: Left;15 reps;Step Height: 4" Forward Step Up: Left;15 reps;Step Height: 4" Functional Squat: 15 reps Rocker Board: 1 minute;Limitations Rocker Board Limitations: R<>L, A<>P Other Standing Knee Exercises: Toe Raise 15 reps Other Standing Knee Exercises: 6 in hurdle walking 1 RT (4 hurdles) Seated   Supine   Sidelying   Prone  Hamstring Curl: 15 reps Hip Extension: 15 reps   Manual Therapy Manual Therapy: Other (comment) Joint Mobilization: Grade I-III L knee mobs to increase flexion  w/passive overstretch in prone secondary to cramps in supinex10 minutes Other Manual Therapy: PROM in prone 0-108; AROM in prone: 0-75  Physical Therapy Assessment and Plan PT Assessment and Plan Clinical Impression Statement: Pt has greatly improved in her overall gait mechanics, with greatest impairment with decreased knee flexion.  PT Plan: Cont to progress ROM, strength and balance.     Goals    Problem  List Patient Active Problem List  Diagnoses  . DEGENERATIVE JOINT DISEASE, LEFT HIP  . KNEE PAIN  . TOTAL HIP FOLLOW-UP  . TOTAL KNEE FOLLOW-UP  . Acquired trigger finger  . OA (osteoarthritis) of knee  . S/P total knee replacement  . Difficulty in walking  . Knee stiffness  . Knee pain    PT - End of Session Activity Tolerance: Patient tolerated treatment well  Humberto Addo 10/21/2010, 10:19 AM

## 2010-10-22 ENCOUNTER — Ambulatory Visit (INDEPENDENT_AMBULATORY_CARE_PROVIDER_SITE_OTHER): Payer: Medicare Other | Admitting: Orthopedic Surgery

## 2010-10-22 ENCOUNTER — Encounter: Payer: Self-pay | Admitting: Orthopedic Surgery

## 2010-10-22 VITALS — Ht 68.0 in | Wt 280.0 lb

## 2010-10-22 DIAGNOSIS — M5442 Lumbago with sciatica, left side: Secondary | ICD-10-CM

## 2010-10-22 DIAGNOSIS — M5430 Sciatica, unspecified side: Secondary | ICD-10-CM

## 2010-10-22 MED ORDER — METHOCARBAMOL 500 MG PO TABS: 500.0000 mg | ORAL_TABLET | Freq: Four times a day (QID) | ORAL | Status: AC

## 2010-10-22 MED ORDER — HYDROCODONE-ACETAMINOPHEN 7.5-325 MG PO TABS: 1.0000 | ORAL_TABLET | Freq: Four times a day (QID) | ORAL | Status: AC | PRN

## 2010-10-22 MED ORDER — GABAPENTIN 100 MG PO CAPS: 100.0000 mg | ORAL_CAPSULE | Freq: Three times a day (TID) | ORAL | Status: DC

## 2010-10-22 MED ORDER — PREDNISONE (PAK) 10 MG PO TABS: 10.0000 mg | ORAL_TABLET | Freq: Every day | ORAL | Status: AC

## 2010-10-22 NOTE — Patient Instructions (Signed)
Start new medications as ordered

## 2010-10-22 NOTE — Progress Notes (Signed)
Problem  LEFT leg pain.  Status post LEFT total knee replacement on September 09, 2010 complains of pain radiating down the LEFT leg towards her LEFT knee.  Patient is in outpatient physical therapy at this time her pain started Friday.  Patient x-rays of her lumbar spine, which show degenerative disc disease.  X-rays of the nonsurgical hip, which were relatively normal.  Review of systems, bowel, bladder function, normal.  Ambulation is unsupported, but LEFT lower extremity is favored with a noticeable antalgic gait. Knee flexion, 95. The incision is clean, dry, and intact. Neurovascular exam is normal. Patient is awake, alert, and oriented x3, normal mood and affect.  Impression sciatica LEFT leg.  Recommend medications as indicated, and medical record.  Followup in 2 weeks. If no improvement recommend MRI spine.

## 2010-10-23 ENCOUNTER — Ambulatory Visit (HOSPITAL_COMMUNITY)
Admission: RE | Admit: 2010-10-23 | Discharge: 2010-10-23 | Disposition: A | Discharge status: Home / Self Care | Payer: Medicare Other | Source: Ambulatory Visit | Attending: Orthopedic Surgery | Admitting: Orthopedic Surgery

## 2010-10-23 NOTE — Progress Notes (Signed)
Physical Therapy Treatment Patient Details  Name: [name] MRN: [id number] Date of Birth: 1950/12/02  Today's Date: 10/23/2010 Time: 4098-1191 Time Calculation (min): 44 min Charges: 12' manual, 26' TE Visit#: 4  of 12   Re-eval: 11/15/10    Subjective: Symptoms/Limitations Symptoms: Pt reports that she refilled her pain medication which is helping, however continues to have increased pain down the side of her leg and into her back.  Pain Assessment Currently in Pain?: Yes  Precautions/Restrictions     Mobility (including Balance)       Exercise/Treatments Stretches Passive Hamstring Stretch: 3 reps;30 seconds ITB Stretch: 3 reps;30 seconds Aerobic Stationary Bike: Upright Schwinn bike 6" for ROM Machines for Strengthening   Plyometrics   Standing Lateral Step Up: Left;15 reps;Step Height: 4" Forward Step Up: Left;15 reps;Step Height: 4" Seated   Supine Short Arc Quad Sets: 15 reps;Limitations Short Arc Quad Sets Limitations: 2# Bridges: 10 reps Straight Leg Raises: 15 reps Sidelying   Prone  Hamstring Curl: 15 reps Hip Extension: 15 reps;Limitations Hip Extension Limitations: 2# Other Prone Exercises: Heel Squeeze x10   Manual Therapy Manual Therapy: Joint mobilization Joint Mobilization: Grade I-III L knee mobs to increase flexion w/passive overstretch in prone x12 minutes  Other Manual Therapy: Prone: PROM: 0-108; AROM: 0-95  Physical Therapy Assessment and Plan PT Assessment and Plan Clinical Impression Statement: Pt continues to improve her functional AROM, however continues to be limited by lateral knee pain.  She tolerated new ther-ex with increased weight without difficulty.  PT Plan: Cont to progress ROM, strength and balance    Goals    Problem List Patient Active Problem List  Diagnoses  . DEGENERATIVE JOINT DISEASE, LEFT HIP  . KNEE PAIN  . TOTAL HIP FOLLOW-UP  . TOTAL KNEE FOLLOW-UP  . Acquired trigger finger  . OA  (osteoarthritis) of knee  . S/P total knee replacement  . Difficulty in walking  . Knee stiffness  . Knee pain  . Lumbago with sciatica of left side    PT - End of Session Activity Tolerance: Patient tolerated treatment well  [name] 10/23/2010, 8:47 AM

## 2010-10-25 ENCOUNTER — Ambulatory Visit (HOSPITAL_COMMUNITY)
Admission: RE | Admit: 2010-10-25 | Discharge: 2010-10-25 | Disposition: A | Discharge status: Home / Self Care | Payer: Medicare Other | Source: Ambulatory Visit | Attending: Physical Therapy | Admitting: Physical Therapy

## 2010-10-25 NOTE — Progress Notes (Signed)
Physical Therapy Treatment Patient Details  Name: [name] MRN: [id number] Date of Birth: [birth year]  Today's Date: 10/25/2010 Time: 4098-1191 Time Calculation (min): 50 min Charges: 42' TE, 8' Manual Visit#: 5  of 12   Re-eval: 11/15/10   Subjective: Symptoms/Limitations Symptoms: Pt reports that she is sore today.  She is able to make full rotation around the bike today.  Pain Assessment Currently in Pain?: Yes Pain Location: Knee Pain Orientation: Left  Exercise/Treatments Stretches ITB Stretch: 3 reps;30 seconds Aerobic Stationary Bike: Upright Schwinn bike 6" for ROM Machines for Strengthening Cybex Knee Extension: 3.5 pL x15 w/eccentric lowering Standing Heel Raises: Limitations Heel Raises Limitations: HEP Lateral Step Up: Left;15 reps;Step Height: 4" Forward Step Up: Left;15 reps;Step Height: 4" Step Down: Left;15 reps;Step Height: 4" Functional Squat: Limitations Functional Squat Limitations: HEP Prone  Hamstring Curl: 15 reps;Limitations Hamstring Curl Limitations: w/3#; 10x w/overpressure and isometric hold x5 sec to increse strength at endrange Hip Extension: 15 reps;Limitations Hip Extension Limitations: 3# Other Prone Exercises: Heel Squeeze 10x5 sec   Manual Therapy Manual Therapy: Joint mobilization Joint Mobilization: Grade I-III L knee mobs to increase flexion w/passive overstretch in prone x8 minutes  Other Manual Therapy: AROM: 0-98, PROM: 0-110  Physical Therapy Assessment and Plan PT Assessment and Plan Clinical Impression Statement: Pt has improved AROM and PROM with decreased pain at end range of knee flexion.  Continues to suffer with cramping to quads, hamstrings and lower paraspinal musculature with passive knee flexion. Improved gait mechanics after treatment today PT Plan: Cont to progress.  Start with bike then manual then ther-ex.    Problem List Patient Active Problem List  Diagnoses  . DEGENERATIVE JOINT DISEASE, LEFT HIP    . KNEE PAIN  . TOTAL HIP FOLLOW-UP  . TOTAL KNEE FOLLOW-UP  . Acquired trigger finger  . OA (osteoarthritis) of knee  . S/P total knee replacement  . Difficulty in walking  . Knee stiffness  . Knee pain  . Lumbago with sciatica of left side    PT - End of Session Activity Tolerance: Patient tolerated treatment well  [name] 10/25/2010, 8:57 AM

## 2010-10-28 ENCOUNTER — Ambulatory Visit (HOSPITAL_COMMUNITY)
Admission: RE | Admit: 2010-10-28 | Discharge: 2010-10-28 | Disposition: A | Discharge status: Home / Self Care | Payer: Medicare Other | Source: Ambulatory Visit | Attending: Orthopedic Surgery | Admitting: Orthopedic Surgery

## 2010-10-28 NOTE — Progress Notes (Addendum)
Physical Therapy Treatment Patient Details  Name: [name] MRN: [id number] Date of Birth: 1950/10/27  Today's Date: 10/28/2010 Time: 4098-1191 Time Calculation (min): 43 min Charges: 8' manual, 35' TE Visit#: 6  of 12   Re-eval: 11/15/10 Assessment Next MD Visit: 11/05/10  Subjective: Symptoms/Limitations Symptoms: Pt reports she is feeling a little stiff because the rain is coming.  She reports she was able to go to church without difficulty.  She reports that she still is going up and down stairs with step to pattern, leading with RLE Pain Assessment Currently in Pain?: No/denies  Exercise/Treatments Aerobic Stationary Bike: Upright Schwinn bike 6" for ROM Machines for Strengthening Cybex Knee Extension: 3.5 pL x15 w/eccentric lowering Cybex Knee Flexion: 5 PL x15 Standing Knee Flexion: Left;Limitations Knee Flexion Limitations: TKF 4' step 3#, 15x Terminal Knee Extension: Left;10 reps;Limitations Theraband Level (Terminal Knee Extension): Level 4 (Blue) Terminal Knee Extension Limitations: 5 sec hold Stairs: 1 RT ascend/descend 1 handrail, 1 RT lateral 1 handrail SLS: 3x3-   Manual Therapy Manual Therapy: Joint mobilization Joint Mobilization: Grade I-III L knee mobs to increase flexion w/passive overstretch in prone x8 minutes  Other Manual Therapy: AROM: 0-94, PROM: 0-113  Physical Therapy Assessment and Plan PT Assessment and Plan Clinical Impression Statement: Pt demonstrated mild increased difficulty with L quadricep eccentric control.  Added ther-ex today to promote increasing strength at end range of knee flexion. Pt had decreased knee pain at end of PROM today.  PT Plan: Cont to progress. Start with bike then manual then ther-ex    Goals    Problem List Patient Active Problem List  Diagnoses  . DEGENERATIVE JOINT DISEASE, LEFT HIP  . KNEE PAIN  . TOTAL HIP FOLLOW-UP  . TOTAL KNEE FOLLOW-UP  . Acquired trigger finger  . OA (osteoarthritis) of  knee  . S/P total knee replacement  . Difficulty in walking  . Knee stiffness  . Knee pain  . Lumbago with sciatica of left side    PT - End of Session Activity Tolerance: Patient tolerated treatment well  [name] 10/28/2010, 8:45 AM

## 2010-10-30 ENCOUNTER — Ambulatory Visit (HOSPITAL_COMMUNITY)
Admission: RE | Admit: 2010-10-30 | Discharge: 2010-10-30 | Disposition: A | Discharge status: Home / Self Care | Payer: Medicare Other | Source: Ambulatory Visit | Attending: Family Medicine | Admitting: Family Medicine

## 2010-10-30 NOTE — Progress Notes (Signed)
Physical Therapy Treatment Patient Details  Name: TAYLORANN TKACH MRN: [id number] Date of Birth: 05-30-1950  Today's Date: 10/30/2010 Time: 0800-0850 Time Calculation (min): 50 min Visit#: 7  of 12   Re-eval: 11/15/10 Charges:  Therex:  36', manual 10'    Subjective: Symptoms/Limitations Symptoms: Pt. reports no pain, just stiffness.  Completed bike, manual with PROM and then therex in order. Pain Assessment Currently in Pain?: No/denies  Precautions/Restrictions :  THR precautions     Exercise/Treatments Stretches Passive Hamstring Stretch: 3 reps;30 seconds;Limitations Passive Hamstring Stretch Limitations: 110 AROM, 115 PROM flexion Aerobic Stationary Bike: Upright Schwinn bike 6" for ROM Machines for Strengthening Cybex Knee Extension: 3.5 pL 2x10 w/eccentric lowering Cybex Knee Flexion: 5 PL 2x10 Standing Knee Flexion: 20 reps Knee Flexion Limitations: TKF 4' step 3#, 15x Terminal Knee Extension: 20 reps Theraband Level (Terminal Knee Extension): Level 4 (Blue) Terminal Knee Extension Limitations: 5 sec hold SLS: 47" max of 3 Seated   Supine   Sidelying   Prone      Manual Therapy Manual Therapy: Myofascial release Myofascial Release: MFR to scar/lateral knee Joint Mobilization: PROM for flexion;  AROM to 110 degrees, PROM to 115 degrees.  Physical Therapy Assessment and Plan PT Assessment and Plan Clinical Impression Statement: Pt. with improved eccentric control today with quad machine and increased A/PROM. PT Treatment/Interventions: Therapeutic exercise;Other (comment) (Manual techniques) PT Plan: Continue; Start with bike, then manual and therex.    Goals    Problem List Patient Active Problem List  Diagnoses  . DEGENERATIVE JOINT DISEASE, LEFT HIP  . KNEE PAIN  . TOTAL HIP FOLLOW-UP  . TOTAL KNEE FOLLOW-UP  . Acquired trigger finger  . OA (osteoarthritis) of knee  . S/P total knee replacement  . Difficulty in walking  . Knee  stiffness  . Knee pain  . Lumbago with sciatica of left side    PT - End of Session Activity Tolerance: Patient tolerated treatment well General Behavior During Session: [facility] for tasks performed Cognition: [facility] for tasks performed  Emeline Gins B 10/30/2010, 8:54 AM

## 2010-10-31 LAB — DIFFERENTIAL
Blasts: 0
Lymphocytes Relative: 27
Myelocytes: 0
Neutro Abs: 5.4
Neutrophils Relative %: 62
Promyelocytes Absolute: 0
nRBC: 0

## 2010-10-31 LAB — CBC
MCHC: 35
MCV: 83
Platelets: 262

## 2010-11-01 ENCOUNTER — Ambulatory Visit (HOSPITAL_COMMUNITY)
Admission: RE | Admit: 2010-11-01 | Discharge: 2010-11-01 | Disposition: A | Discharge status: Home / Self Care | Payer: Medicare Other | Source: Ambulatory Visit | Attending: Family Medicine | Admitting: Family Medicine

## 2010-11-01 NOTE — Progress Notes (Addendum)
Physical Therapy Treatment Patient Details  Name: [name] MRN: [id number] Date of Birth: [birth year]  Today's Date: 11/01/2010 Time: 4098-1191 Time Calculation (min): 44 min Charges: 8' manual, 36' Te Visit#: 8  of 12   Re-eval: 11/15/10    Subjective: Symptoms/Limitations Symptoms: Pt reports that she is still having pain from the left side of her back down to her knee and is starting to have some left leg pain too.   Pain Assessment Currently in Pain?: Yes Pain Score:   2 Pain Location: Leg  Exercise/Treatments Stretches ITB Stretch: 3 reps;30 seconds Aerobic Stationary Bike: Upright Schwinn bike 6" for warm up Machines for Strengthening Cybex Knee Extension: 4.5 PL 2x10 w/L eccentric lowering Cybex Knee Flexion: 5.5 PL 2x10 Plyometrics   Standing Lateral Step Up: Left;15 reps;Step Height: 4" Other Standing Knee Exercises: Hip Hike 10x Other Standing Knee Exercises: 12 in. hurdles (4) 3 RT Seated   Supine   Sidelying   Prone  Hamstring Curl: 20 reps;Limitations Hamstring Curl Limitations: 3# Hip Extension: 20 reps;Limitations Hip Extension Limitations: 3# Other Prone Exercises: PROM stretching 0-113, AROM: 0-92   Manual Therapy Manual Therapy: Myofascial release Myofascial Release: MFR to scar/lateral knee  Joint Mobilization: Grade II-III joint mobs to increase knee flexion in prone Other Manual Therapy: PROM stretching 0-113, AROM: 0-92 (prone measurments) w/passive overstretch after for flexion. x8 minutes total of manual  Physical Therapy Assessment and Plan PT Assessment and Plan Clinical Impression Statement: She continues to have improved AROM against gravity, however lacks strength at end range of knee flexion.  Continues to ambulate with lateral trunk lean.  PT Plan: ROM progress note next visit secondary to MD visit.  Cont to progress strength in end range of motion and functional hip strength to eliminate lateral trunk lean.     Goals      Problem List Patient Active Problem List  Diagnoses  . DEGENERATIVE JOINT DISEASE, LEFT HIP  . KNEE PAIN  . TOTAL HIP FOLLOW-UP  . TOTAL KNEE FOLLOW-UP  . Acquired trigger finger  . OA (osteoarthritis) of knee  . S/P total knee replacement  . Difficulty in walking  . Knee stiffness  . Knee pain  . Lumbago with sciatica of left side       [name] 11/01/2010, 8:58 AM

## 2010-11-04 ENCOUNTER — Ambulatory Visit (HOSPITAL_COMMUNITY)
Admission: RE | Admit: 2010-11-04 | Discharge: 2010-11-04 | Disposition: A | Discharge status: Home / Self Care | Payer: Medicare Other | Source: Ambulatory Visit | Attending: Family Medicine | Admitting: Family Medicine

## 2010-11-04 NOTE — Progress Notes (Signed)
Physical Therapy Treatment Patient Details  Name: [name] MRN: [id number] Date of Birth: May 09, 1950  Today's Date: 11/04/2010 Time: 7829-562 Charges: 1 ROM, 1 MMT, 18' Manual, 12' TE   Visit#: 9  of 17   Re-eval: 12/04/10    Subjective: Symptoms/Limitations Symptoms: Pt reports that she is doing well with her exercises at home.  She is not complaining of any increased pain currently, however continues to have the greatest amount of difficulty with stairs and increased tightness to her knee which is limiting her ability to sleep well.   Pain Assessment Currently in Pain?: No/denies  Static Standing Balance Single Leg Stance - Left Leg: 35   Exercise/Treatments Stretches Passive Hamstring Stretch: 3 reps;30 seconds;Limitations ITB Stretch: 3 reps;30 seconds Aerobic Stationary Bike: Upright Schwinn bike 6" for warm up Machines for Strengthening Cybex Knee Extension: 4.5 PL 2x10 w/L eccentric lowering Standing Stairs: 1 RT ascend/descend 1 handrail, 1 RT lateral 1 handrail, 4 stairs w/o handrail.   Myofascial release To distal ITB w/CTR and STM to follow to decrease overall spasm and fascial restriction x18' PROM: 0-113; AROM: 0-105 (supine). MMT and ROM taken for progress note  Physical Therapy Assessment and Plan PT Assessment and Plan Clinical Impression Statement: Ms. Honda has made significant progress towards her goals.  She has met 4/4 STG and has made significant progress toward her remaining LTG.  She has improved her overall strength, ROM, balance, flexibility and power, however continues to be limitied in functional activities secondary to increased muscular/fascial adhesions which increases her pain as well as lateral leg numbness which may be due to her history of chronic back pain.  Ms. Zabawa will continue to benefit from skilled PT services to address the above impairments in order to maxmize function.  Rehab Potential: Good PT Frequency: Min 2X/week PT  Duration: 4 weeks PT Plan: Continue with functional stair training, decreasing ITB/knee pain and functional ROM.     Goals Home Exercise Program Pt will Perform Home Exercise Program: Independently PT Short Term Goals Time to Complete Short Term Goals: 2 weeks PT Short Term Goal 1: Pt will improve her AROM  0-100 PT Short Term Goal 1 - Progress: Met PT Short Term Goal 2: Pt will improve strength by 1 muscle grade to LLE.  PT Short Term Goal 2 - Progress: Met PT Short Term Goal 3: Pt will improve L SLS x15 sec on static surface PT Short Term Goal 3 - Progress: Met PT Short Term Goal 4: Pt will demonstrate sit to stand w/o UE support PT Short Term Goal 4 - Progress: Met PT Short Term Goal 5: Pt will report pain less than or equal to 4/10 for 50% of her day PT Short Term Goal 5 - Progress: Met PT Long Term Goals Time to Complete Long Term Goals: 8 weeks (6 weeks) PT Long Term Goal 1: Pt will improve AROM 0-110 degrees in order to easily get into and out of her truck.  PT Long Term Goal 1 - Progress: Progressing toward goal PT Long Term Goal 2: Pt will improve her LE strength in order to ascend and descend 12 stairs with 1 handrail with reciprocal pattern in order to enter friends apartment.  PT Long Term Goal 2 - Progress: Progressing toward goal Long Term Goal 3: Pt will report pain less than or equal to 3/10 for 75% of her day for improved quality of life.  Long Term Goal 3 Progress: Progressing toward goal Long Term Goal 4: Pt  will improve her power by demonstrating 5 sit to stands in 13 sec w/o UE support. Long Term Goal 4 Progress: Progressing toward goal PT Long Term Goal 5: Pt will demonstrate tandem gait on uneven surface x50 feet for improved saftey with ambulation  Long Term Goal 5 Progress: Not met  Problem List Patient Active Problem List  Diagnoses  . DEGENERATIVE JOINT DISEASE, LEFT HIP  . KNEE PAIN  . TOTAL HIP FOLLOW-UP  . TOTAL KNEE FOLLOW-UP  . Acquired trigger  finger  . OA (osteoarthritis) of knee  . S/P total knee replacement  . Difficulty in walking  . Knee stiffness  . Knee pain  . Lumbago with sciatica of left side       Wilian Kwong 11/04/2010, 9:43 AM

## 2010-11-05 ENCOUNTER — Other Ambulatory Visit: Payer: Self-pay | Admitting: Family Medicine

## 2010-11-05 ENCOUNTER — Encounter: Payer: Self-pay | Admitting: Orthopedic Surgery

## 2010-11-05 ENCOUNTER — Ambulatory Visit (INDEPENDENT_AMBULATORY_CARE_PROVIDER_SITE_OTHER): Payer: Medicare Other | Admitting: Orthopedic Surgery

## 2010-11-05 DIAGNOSIS — M5430 Sciatica, unspecified side: Secondary | ICD-10-CM

## 2010-11-05 DIAGNOSIS — G8929 Other chronic pain: Secondary | ICD-10-CM

## 2010-11-05 DIAGNOSIS — Z96659 Presence of unspecified artificial knee joint: Secondary | ICD-10-CM

## 2010-11-05 DIAGNOSIS — M5442 Lumbago with sciatica, left side: Secondary | ICD-10-CM

## 2010-11-05 NOTE — Patient Instructions (Signed)
Continue medications and therapy

## 2010-11-05 NOTE — Progress Notes (Signed)
Status post total knee LEFT, doing well and progressing in therapy.  Developed some back pain and leg pain, which is improved with steroids, and gabapentin. Still having some residual back pain currently in therapy for that.  Physical Exam(6) GENERAL: normal development   CDV: pulses are normal   Skin: normal  Psychiatric: awake, alert and oriented  Neuro: normal sensation  MSK gait normal  1 left knee full extension 2 motor 5/5 3 knee stable  4 no swelling   Assessment: s/p TKA, DDD L spine     Plan: continue meds and therapy , return in 3 months

## 2010-11-06 ENCOUNTER — Ambulatory Visit (HOSPITAL_COMMUNITY): Payer: Medicare Other | Admitting: Physical Therapy

## 2010-11-08 ENCOUNTER — Ambulatory Visit (HOSPITAL_COMMUNITY)
Admission: RE | Admit: 2010-11-08 | Discharge: 2010-11-08 | Disposition: A | Discharge status: Home / Self Care | Payer: Medicare Other | Source: Ambulatory Visit | Attending: Family Medicine | Admitting: Family Medicine

## 2010-11-08 NOTE — Progress Notes (Signed)
Physical Therapy Treatment Patient Details  Name: [name] MRN: [id number] Date of Birth: 1950-11-05  Today's Date: 11/08/2010 Time: 4098-1191 Time Calculation (min): 46 min Visit#: 10  of 17   Re-eval: 12/04/10  Charge: therex 30 min Manual 10 min  Subjective: Symptoms/Limitations Symptoms: No pain today just stiff knee this morning. Pain Assessment Currently in Pain?: No/denies  Objective:   Exercise/Treatments Stretches Passive Hamstring Stretch: 3 reps;30 seconds;Limitations Aerobic Stationary Bike: Upright Schwinn bike 6" for warm up Machines for Strengthening Cybex Knee Extension: 4.5 PL 2x10 w/L eccentric lowering Cybex Knee Flexion: 6 Pl 2x 10 Standing Lateral Step Up: Left;Limitations Lateral Step Up Limitations: 2 Fl on stairwell Forward Step Up: Left;Limitations Forward Step Up Limitations: 3 RT Step Down: Left;Limitations Step Down Limitations: 3 RT Stairs: 3 Rt   Manual Therapy Myofascial Release: To distal ITB w/CTR and STM to follow to decrease overall spasm and fascial restriction x10'  Physical Therapy Assessment and Plan PT Assessment and Plan Clinical Impression Statement: Pt c/o increased pain distal ITB and vastus lateralis, complete CTR with STM for quad spasms reduction with decreased pain stated following.  Pt functional strength continues to improve but still shows weak eccentric control while descending stairs. PT Plan: Continue with functional stair training, decreasing ITB/knee pain and functional ROM.    Goals    Problem List Patient Active Problem List  Diagnoses  . DEGENERATIVE JOINT DISEASE, LEFT HIP  . KNEE PAIN  . TOTAL HIP FOLLOW-UP  . TOTAL KNEE FOLLOW-UP  . Acquired trigger finger  . OA (osteoarthritis) of knee  . S/P total knee replacement  . Difficulty in walking  . Knee stiffness  . Knee pain  . Lumbago with sciatica of left side    PT - End of Session Activity Tolerance: Patient tolerated treatment  well General Behavior During Session: [facility] for tasks performed Cognition: [facility] for tasks performed  Juel Burrow 11/08/2010, 10:00 AM

## 2010-11-11 ENCOUNTER — Ambulatory Visit (HOSPITAL_COMMUNITY)
Admission: RE | Admit: 2010-11-11 | Discharge: 2010-11-11 | Disposition: A | Discharge status: Home / Self Care | Payer: Medicare Other | Source: Ambulatory Visit | Attending: Family Medicine | Admitting: Family Medicine

## 2010-11-11 ENCOUNTER — Ambulatory Visit (HOSPITAL_COMMUNITY)
Admission: RE | Admit: 2010-11-11 | Discharge: 2010-11-11 | Disposition: A | Discharge status: Home / Self Care | Payer: Medicare Other | Source: Ambulatory Visit | Attending: Orthopedic Surgery | Admitting: Orthopedic Surgery

## 2010-11-11 DIAGNOSIS — G8929 Other chronic pain: Secondary | ICD-10-CM

## 2010-11-11 DIAGNOSIS — R51 Headache: Secondary | ICD-10-CM | POA: Insufficient documentation

## 2010-11-11 DIAGNOSIS — R42 Dizziness and giddiness: Secondary | ICD-10-CM | POA: Insufficient documentation

## 2010-11-11 NOTE — Progress Notes (Signed)
Physical Therapy Treatment Patient Details  Name: [name] MRN: [id number] Date of Birth: 01/03/1951  Today's Date: 11/11/2010 Time: 4010-2725 Time Calculation (min): 49 min Charges: 15' manual, 71' TE Visit#: 12  of 17   Re-eval: 12/04/10    Subjective: Symptoms/Limitations Symptoms: Pt reports that her L knee is doing well, however continues to have decreased stability with her R hip. Pain Assessment Currently in Pain?: Yes Pain Score:   2  Precautions/Restrictions     Mobility (including Balance)       Exercise/Treatments Stretches Lobbyist: 60 seconds;5 reps;Limitations Quad Stretch Limitations: Stretch until she had decreased pain Aerobic Stationary Bike: Upright Schwinn bike 6" for warm up Machines for Strengthening Cybex Knee Extension: 4.5 PL x15 Cybex Knee Flexion: 6 Pl 2x 10 Standing Lateral Step Up: Left;15 reps;Step Height: 6" Forward Step Up: Left;15 reps;Step Height: 6" Step Down: Left;15 reps;Step Height: 6" Seated Other Seated Knee Exercises: Roll in and outs with heels and toes 5x each Prone  Hamstring Curl: 20 reps Hamstring Curl Limitations: 3# Hip Extension: 20 reps Hip Extension Limitations: 3#   Manual Therapy Manual Therapy: Other (comment) Other Manual Therapy: AROM: 0-105, PROM: 0-120;  R S/L ITB stripping/STM/CTR To distal ITB w/knee flexion and extension to decrease overall spasm and fascial restriction x15'   Physical Therapy Assessment and Plan PT Assessment and Plan Clinical Impression Statement: Pt had decreased pain after treatment today.  She continues to gain strength, ROM and improved gait mechanics.   PT Plan: Continue to address pain and improve fuctional tasks.    Goals    Problem List Patient Active Problem List  Diagnoses  . DEGENERATIVE JOINT DISEASE, LEFT HIP  . KNEE PAIN  . TOTAL HIP FOLLOW-UP  . TOTAL KNEE FOLLOW-UP  . Acquired trigger finger  . OA (osteoarthritis) of knee  . S/P total knee  replacement  . Difficulty in walking  . Knee stiffness  . Knee pain  . Lumbago with sciatica of left side       Jasper Ruminski 11/11/2010, 8:54 AM

## 2010-11-13 ENCOUNTER — Ambulatory Visit (HOSPITAL_COMMUNITY): Payer: Medicare Other | Admitting: Physical Therapy

## 2010-11-15 ENCOUNTER — Ambulatory Visit (HOSPITAL_COMMUNITY)
Admission: RE | Admit: 2010-11-15 | Discharge: 2010-11-15 | Disposition: A | Discharge status: Home / Self Care | Payer: Medicare Other | Source: Ambulatory Visit | Attending: Orthopedic Surgery | Admitting: Orthopedic Surgery

## 2010-11-15 DIAGNOSIS — M25669 Stiffness of unspecified knee, not elsewhere classified: Secondary | ICD-10-CM | POA: Insufficient documentation

## 2010-11-15 DIAGNOSIS — M25569 Pain in unspecified knee: Secondary | ICD-10-CM | POA: Insufficient documentation

## 2010-11-15 DIAGNOSIS — M6281 Muscle weakness (generalized): Secondary | ICD-10-CM | POA: Insufficient documentation

## 2010-11-15 DIAGNOSIS — R262 Difficulty in walking, not elsewhere classified: Secondary | ICD-10-CM | POA: Insufficient documentation

## 2010-11-15 DIAGNOSIS — IMO0001 Reserved for inherently not codable concepts without codable children: Secondary | ICD-10-CM | POA: Insufficient documentation

## 2010-11-15 NOTE — Progress Notes (Signed)
Physical Therapy Treatment Patient Details  Name: [name] MRN: [id number] Date of Birth: November 18, 1950  Today's Date: 11/15/2010 Time: 4098-1191 Time Calculation (min): 48 min Charges: Manual x12', TE: x30' Visit#: 13  of 17   Re-eval: 12/04/10    Subjective: Symptoms/Limitations Symptoms: Pt reports that she has continued tightness and is having difficulty sleeping throughout the night.  Pain Assessment Pain Score:   2  Exercise/Treatments Stretches Passive Hamstring Stretch: 60 seconds;3 reps Quad Stretch: 60 seconds;4 reps Quad Stretch Limitations: Stretch until she had decreased pain ITB Stretch: 60 seconds;4 reps;Limitations ITB Stretch Limitations: Standing - strech until felt stretched  Aerobic Stationary Bike: Upright Schwinn bike 6" for warm up Seated Other Seated Knee Exercises: Roll in and outs with heels and toes 5x each (5 STS: 9.9 sec) Other Seated Knee Exercises: 5 STS: 9.9 sec   Manual Therapy Manual Therapy: Other (comment) Other Manual Therapy: AROM: 0-110, PROM: 0-120; Prone ITB stripping/STM/CTR To distal ITB w/knee flexion and extension to decrease overall spasm and fascial restriction x12'   Physical Therapy Assessment and Plan PT Assessment and Plan Clinical Impression Statement: Today's treatment focus on increased flexibility and decreasing overall stiffness to her L knee to improve mechanics.  She has had improved strength and ROM and needs to focus on flexibility to decreased overall pain and stiffness.   PT Plan: Progress note to discuss D/C.      Goals Home Exercise Program Pt will Perform Home Exercise Program: Independently PT Goal: Perform Home Exercise Program - Progress: Met PT Short Term Goals Time to Complete Short Term Goals: 2 weeks PT Short Term Goal 1: Pt will improve her AROM  0-100 PT Short Term Goal 1 - Progress: Met PT Short Term Goal 2: Pt will improve strength by 1 muscle grade to LLE.  PT Short Term Goal 2 -  Progress: Met PT Short Term Goal 3: Pt will improve L SLS x15 sec on static surface PT Short Term Goal 3 - Progress: Met PT Short Term Goal 4: Pt will demonstrate sit to stand w/o UE support PT Short Term Goal 4 - Progress: Met PT Short Term Goal 5: Pt will report pain less than or equal to 4/10 for 50% of her day PT Short Term Goal 5 - Progress: Met PT Long Term Goals Time to Complete Long Term Goals: 8 weeks (6 weeks) PT Long Term Goal 1: Pt will improve AROM 0-110 degrees in order to easily get into and out of her truck.  PT Long Term Goal 1 - Progress: Met PT Long Term Goal 2: Pt will improve her LE strength in order to ascend and descend 12 stairs with 1 handrail with reciprocal pattern in order to enter friends apartment.  Long Term Goal 3: Pt will report pain less than or equal to 3/10 for 75% of her day for improved quality of life.  Long Term Goal 3 Progress: Met Long Term Goal 4: Pt will improve her power by demonstrating 5 sit to stands in 13 sec w/o UE support. PT Long Term Goal 5: Pt will demonstrate tandem gait on uneven surface x50 feet for improved saftey with ambulation   Problem List Patient Active Problem List  Diagnoses  . DEGENERATIVE JOINT DISEASE, LEFT HIP  . KNEE PAIN  . TOTAL HIP FOLLOW-UP  . TOTAL KNEE FOLLOW-UP  . Acquired trigger finger  . OA (osteoarthritis) of knee  . S/P total knee replacement  . Difficulty in walking  . Knee stiffness  .  Knee pain  . Lumbago with sciatica of left side    PT - End of Session Activity Tolerance: Patient tolerated treatment well  Elese Rane 11/15/2010, 8:45 AM

## 2010-11-19 ENCOUNTER — Ambulatory Visit (HOSPITAL_COMMUNITY)
Admission: RE | Admit: 2010-11-19 | Discharge: 2010-11-19 | Disposition: A | Discharge status: Home / Self Care | Payer: Medicare Other | Source: Ambulatory Visit | Attending: Family Medicine | Admitting: Family Medicine

## 2010-11-19 NOTE — Progress Notes (Addendum)
Discharge Summary/ Physical Therapy Treatment Patient Details  Name: [name] MRN: [id number] Date of Birth: [birth year]  Today's Date: 11/19/2010 Time: 7846-9629 Time Calculation (min): 38 min Charges: 1 ROM, Manual: 15', TE: 15'  Visit#: 14  of 17   Re-eval: 11/19/10    Subjective: Symptoms/Limitations Symptoms: Pt reports that she is feeling really good.  Her knee does not hurt, it just has increased stiffness.  She reports she is making an apt w/Dr. Romeo Apple to discuss a  THR.   Exercise/Treatments Stretches Lobbyist: 3 reps;60 seconds Lobbyist Limitations: Stretch until she had decreased pain Aerobic Stationary Bike: Upright Schwinn bike 6" for warm up Standing Stairs: 2 RT Other Standing Knee Exercises: Balance Beam 1 RT Seated Other Seated Knee Exercises: Roll in and outs with heels and toes 10x each Supine Bridges: 10 reps Other Supine Knee Exercises: LTR 5x5sec hold   Manual Therapy Myofascial Release: To distal ITB w/CTR and STM to follow to decrease overall spasm and fascial restriction x15' in L S/L Other Manual Therapy: AROM: 0-115  Physical Therapy Assessment and Plan PT Assessment and Plan Clinical Impression Statement: [name] has attended 14 OPPT visits and has met all her STG and LTG.  She has made tremendous strides in her AROM (0-115), balance, strength, flexibility and gait mechanics.  She continues to be mildly limited by increased stiffness to her L knee joint, and reports most of her pain is now concentrated to her R hip and low back which limits some of her functional ability.  She reports improvement everyday while she completes her HEP.  She will continue to benefit from an advanced HEP.  PT Plan: D/C w/advanced HEP    Goals Home Exercise Program Pt will Perform Home Exercise Program: Independently PT Short Term Goals Time to Complete Short Term Goals: 2 weeks PT Short Term Goal 1: Pt will improve her AROM  0-100 PT Short Term  Goal 1 - Progress: Met PT Short Term Goal 2: Pt will improve strength by 1 muscle grade to LLE.  PT Short Term Goal 2 - Progress: Met PT Short Term Goal 3: Pt will improve L SLS x15 sec on static surface PT Short Term Goal 3 - Progress: Met PT Short Term Goal 4: Pt will demonstrate sit to stand w/o UE support PT Short Term Goal 4 - Progress: Met PT Short Term Goal 5: Pt will report pain less than or equal to 4/10 for 50% of her day PT Short Term Goal 5 - Progress: Met PT Long Term Goals Time to Complete Long Term Goals: 8 weeks (6 weeks) PT Long Term Goal 1: Pt will improve AROM 0-110 degrees in order to easily get into and out of her truck.  PT Long Term Goal 1 - Progress: Met PT Long Term Goal 2: Pt will improve her LE strength in order to ascend and descend 12 stairs with 1 handrail with reciprocal pattern in order to enter friends apartment.  PT Long Term Goal 2 - Progress: Met Long Term Goal 3: Pt will report pain less than or equal to 3/10 for 75% of her day for improved quality of life.  Long Term Goal 3 Progress: Met Long Term Goal 4: Pt will improve her power by demonstrating 5 sit to stands in 13 sec w/o UE support. Long Term Goal 4 Progress: Met PT Long Term Goal 5: Pt will demonstrate tandem gait on uneven surface x50 feet for improved saftey with ambulation  Long Term  Goal 5 Progress: Met  Problem List Patient Active Problem List  Diagnoses  . DEGENERATIVE JOINT DISEASE, LEFT HIP  . KNEE PAIN  . TOTAL HIP FOLLOW-UP  . TOTAL KNEE FOLLOW-UP  . Acquired trigger finger  . OA (osteoarthritis) of knee  . S/P total knee replacement  . Difficulty in walking  . Knee stiffness  . Knee pain  . Lumbago with sciatica of left side    PT - End of Session Activity Tolerance: Patient tolerated treatment well  [name] 11/19/2010, 8:38 AM

## 2010-12-11 ENCOUNTER — Ambulatory Visit (INDEPENDENT_AMBULATORY_CARE_PROVIDER_SITE_OTHER): Payer: Medicare Other | Admitting: Orthopedic Surgery

## 2010-12-11 ENCOUNTER — Encounter: Payer: Self-pay | Admitting: Orthopedic Surgery

## 2010-12-11 VITALS — BP 100/60 | Ht 67.0 in | Wt 274.0 lb

## 2010-12-11 DIAGNOSIS — M549 Dorsalgia, unspecified: Secondary | ICD-10-CM

## 2010-12-11 NOTE — Progress Notes (Signed)
Routine followup to discuss possible surgery, RIGHT hip, status post LEFT total hip and bilateral total knees.  History of lumbar degenerative disc disease.  Last x-rays were done at the hospital in August of 2012  Complains of RIGHT hip and lower back pain. Actually, had a bad episode yesterday where her back locked up on her and she had difficulty ambulating.  She really does not complain of anterior thigh pain or groin pain. Her pain is in the lower back and along the lateral RIGHT side of her pelvis and hip, including the gluteal area.  Review of systems occasional radicular symptoms in the LEFT leg.  She is not interested in epidural shots or physical therapy for her spine. She does get some relief with a heating pad on a prednisone Dosepak.  She is ambulating without any assistive device. She has normal neurovascular function in both lower extremities. She has normal flexion, internal and external rotation of the hip without any pain. Muscle function is normal. Hip is stable. No pain with post patellar instability.  Impression status post multiple joint replacements. Impression degenerative disc disease with symptomatic hip pain from the back.  Recommend annual followup for x-rays of her joints, and continued heating pad, and prednisone for her lumbar spine

## 2010-12-11 NOTE — Patient Instructions (Signed)
Take medication for back as needed

## 2011-02-06 ENCOUNTER — Ambulatory Visit: Payer: Medicare Other | Admitting: Orthopedic Surgery

## 2011-05-07 ENCOUNTER — Telehealth: Payer: Self-pay | Admitting: Orthopedic Surgery

## 2011-05-07 NOTE — Telephone Encounter (Signed)
Patient called to relay that her back problem/pain is increasing, and that she would therefore like to be referred to the neurosurgeon as discussed at her last office visit in November 2012; states would like to see Dr. Wynetta Emery.   Has patient had an MRI?  If not, would neurosurgeon see? Patient's phone # is [phone]

## 2011-05-08 NOTE — Telephone Encounter (Signed)
Look in chart to see if mri has been done   If not order one   Dx spinal stenosis   After mri make referral

## 2011-05-09 ENCOUNTER — Other Ambulatory Visit: Payer: Self-pay | Admitting: *Deleted

## 2011-05-09 DIAGNOSIS — M48061 Spinal stenosis, lumbar region without neurogenic claudication: Secondary | ICD-10-CM

## 2011-05-09 NOTE — Telephone Encounter (Signed)
Patient is aware MRI will be scheduled and referral to follow

## 2011-05-09 NOTE — Progress Notes (Signed)
Patient is aware MRI will be ordered and referral will follow results

## 2011-05-20 NOTE — Telephone Encounter (Signed)
MRI scheduled for Thursday 05/22/11 12 noon Patient aware

## 2011-05-22 ENCOUNTER — Ambulatory Visit (HOSPITAL_COMMUNITY)
Admission: RE | Admit: 2011-05-22 | Discharge: 2011-05-22 | Disposition: A | Discharge status: Home / Self Care | Payer: Medicare Other | Source: Ambulatory Visit | Attending: Orthopedic Surgery | Admitting: Orthopedic Surgery

## 2011-05-22 ENCOUNTER — Other Ambulatory Visit: Payer: Self-pay | Admitting: *Deleted

## 2011-05-22 DIAGNOSIS — R937 Abnormal findings on diagnostic imaging of other parts of musculoskeletal system: Secondary | ICD-10-CM | POA: Insufficient documentation

## 2011-05-22 DIAGNOSIS — M48061 Spinal stenosis, lumbar region without neurogenic claudication: Secondary | ICD-10-CM

## 2011-05-22 DIAGNOSIS — M545 Low back pain, unspecified: Secondary | ICD-10-CM | POA: Insufficient documentation

## 2011-05-22 DIAGNOSIS — M5137 Other intervertebral disc degeneration, lumbosacral region: Secondary | ICD-10-CM | POA: Insufficient documentation

## 2011-05-22 DIAGNOSIS — M5126 Other intervertebral disc displacement, lumbar region: Secondary | ICD-10-CM | POA: Insufficient documentation

## 2011-05-22 DIAGNOSIS — M4800 Spinal stenosis, site unspecified: Secondary | ICD-10-CM

## 2011-05-22 DIAGNOSIS — M51379 Other intervertebral disc degeneration, lumbosacral region without mention of lumbar back pain or lower extremity pain: Secondary | ICD-10-CM | POA: Insufficient documentation

## 2011-05-22 NOTE — Telephone Encounter (Signed)
Mri results received and reviewed by dr Romeo Apple, referral to dr cram sent per patient request

## 2011-05-27 ENCOUNTER — Other Ambulatory Visit: Payer: Self-pay | Admitting: Family Medicine

## 2011-05-27 DIAGNOSIS — Z139 Encounter for screening, unspecified: Secondary | ICD-10-CM

## 2011-05-29 ENCOUNTER — Ambulatory Visit (HOSPITAL_COMMUNITY)
Admission: RE | Admit: 2011-05-29 | Discharge: 2011-05-29 | Disposition: A | Discharge status: Home / Self Care | Payer: Medicare Other | Source: Ambulatory Visit | Attending: Family Medicine | Admitting: Family Medicine

## 2011-05-29 DIAGNOSIS — Z139 Encounter for screening, unspecified: Secondary | ICD-10-CM

## 2011-05-29 DIAGNOSIS — Z1231 Encounter for screening mammogram for malignant neoplasm of breast: Secondary | ICD-10-CM | POA: Insufficient documentation

## 2011-06-11 ENCOUNTER — Other Ambulatory Visit (HOSPITAL_COMMUNITY): Payer: Self-pay | Admitting: Neurosurgery

## 2011-06-11 DIAGNOSIS — M542 Cervicalgia: Secondary | ICD-10-CM

## 2011-06-12 ENCOUNTER — Ambulatory Visit (HOSPITAL_COMMUNITY)
Admission: RE | Admit: 2011-06-12 | Discharge: 2011-06-12 | Disposition: A | Discharge status: Home / Self Care | Payer: Medicare Other | Source: Ambulatory Visit | Attending: Neurosurgery | Admitting: Neurosurgery

## 2011-06-12 DIAGNOSIS — M542 Cervicalgia: Secondary | ICD-10-CM

## 2011-06-12 DIAGNOSIS — M5380 Other specified dorsopathies, site unspecified: Secondary | ICD-10-CM | POA: Insufficient documentation

## 2011-06-12 DIAGNOSIS — M5030 Other cervical disc degeneration, unspecified cervical region: Secondary | ICD-10-CM | POA: Insufficient documentation

## 2011-09-11 ENCOUNTER — Ambulatory Visit: Payer: Medicare Other | Admitting: Orthopedic Surgery

## 2011-09-23 ENCOUNTER — Ambulatory Visit (INDEPENDENT_AMBULATORY_CARE_PROVIDER_SITE_OTHER): Payer: Medicare Other | Admitting: Orthopedic Surgery

## 2011-09-23 ENCOUNTER — Ambulatory Visit (INDEPENDENT_AMBULATORY_CARE_PROVIDER_SITE_OTHER): Payer: Medicare Other

## 2011-09-23 ENCOUNTER — Encounter: Payer: Self-pay | Admitting: Orthopedic Surgery

## 2011-09-23 ENCOUNTER — Ambulatory Visit (HOSPITAL_COMMUNITY)
Admission: RE | Admit: 2011-09-23 | Discharge: 2011-09-23 | Disposition: A | Discharge status: Home / Self Care | Payer: Medicare Other | Source: Ambulatory Visit | Attending: Orthopedic Surgery | Admitting: Orthopedic Surgery

## 2011-09-23 VITALS — BP 100/60 | Ht 67.0 in | Wt 285.0 lb

## 2011-09-23 DIAGNOSIS — Z96659 Presence of unspecified artificial knee joint: Secondary | ICD-10-CM | POA: Insufficient documentation

## 2011-09-23 DIAGNOSIS — M6530 Trigger finger, unspecified finger: Secondary | ICD-10-CM

## 2011-09-23 DIAGNOSIS — M25569 Pain in unspecified knee: Secondary | ICD-10-CM | POA: Insufficient documentation

## 2011-09-23 DIAGNOSIS — M25561 Pain in right knee: Secondary | ICD-10-CM

## 2011-09-23 NOTE — Patient Instructions (Addendum)
You have received a steroid shot. 15% of patients experience increased pain at the injection site with in the next 24 hours. This is best treated with ice and tylenol extra strength 2 tabs every 8 hours. If you are still having pain please call the office.   Trigger Finger Trigger finger (digital tendinitis and stenosing tenosynovitis) is a common disorder that causes an often painful catching of the fingers or thumb. It occurs as a clicking, snapping or locking of a finger in the palm of the hand. The reason for this is that there is a problem with the tendons which flex the fingers sliding smoothly through their sheaths. The cause of this may be inflammation of the tendon and sheath, or from a thickening or nodule in the tendon. The condition may occur in any finger or a couple fingers at the same time. The cause may be overuse while doing the same activity over and over again with your hands.   Tendons are the tough cords that connect the muscles to bones. Muscles and tendons are part of the system which allows your body to move. When muscles contract in the forearm on the palm side, they pull the tendons toward the elbow and cause the fingers and thumb to bend (flex) toward the palm. These are the flexor tendons. The tendons slide through a slippery smooth membrane (synovium) which is called the tendon sheath. The sheaths have areas of tough fibrous tissues surrounding them which hold the tendons close to the bone. These are called pulleys because they work like a pulley. The first pulley is in the palm of the hand near the crease which runs across your palm. If the area of the tendon thickening is near the pulley, the tendon cannot slide smoothly through the pulley and this causes the trigger finger. The finger may lock with the finger curled or suddenly straighten out with a snap. This is more common in patients with rheumatoid arthritis and diabetes. Left untreated, the condition may get worse to the  point where the finger becomes locked in flexion, like making a fist, or less commonly locked with the finger straightened out. DIAGNOSIS   Your caregiver will easily make this diagnosis on examination. TREATMENT    Splinting for 6 to 8 weeks of time may be helpful. Use the splints as your caregiver suggests.   Heat used for twenty minutes at least four times a day followed by ice packs for twenty minutes unless directed otherwise by your caregiver may be helpful. If you find either heat or cold seems to be making the problem worse, quit using them and ask your caregiver for directions.   Cortisone injections along with splinting may speed up recovery. Several injections may be required. Cortisone may give relief after one injection.   Only take over-the-counter or prescription medicines for pain, discomfort, or fever as directed by your caregiver.   Surgery is another treatment that may be used if conservative treatments using injection and splinting does not work. Surgery can be minor without incisions (a cut does not have to be made) and can be done with a needle through the skin. No stitches are needed and most patients may return to work the same day.   Other surgical choices involve an open procedure where the surgeon opens the hand through a small incision (cut) and cuts the pulley so the tendon can again slide smoothly. Your hand will still work fine. This small operation requires stitches and the recovery will   be a little longer and the incisions will need to be protected until completely healed. You may have to limit your activities for up to 6 months.   Occupational or hand therapy may be required if there is stiffness remaining in the finger.  RISKS AND COMPLICATIONS Complications are uncommon but some problems that may occur are:  Recurrence of the trigger finger. This does not mean that the surgery was not well done. It simply means that you may have formed scar tissue following  surgery that causes the problem to reoccur.   Infection which could ruin the results of the surgery and can result in a finger which is frozen and can not move normally.   Nerve injury is possible which could result in permanent numbness of one or more fingers.  CARE AFTER SURGERY  Elevate your hand above your heart and use ice as instructed.   Follow instructions regarding finger motion/exercise.   Keep the surgical wound dry for at least 48 hrs or longer if instructed.   Keep your follow-up appointments.   Return to work and normal activities as instructed.  SEEK IMMEDIATE MEDICAL CARE IF:   Your problems are getting worse or you do not obtain relief from the treatment. Document Released: 10/20/2003 Document Revised: 12/19/2010 Document Reviewed: 06/13/2008 [employer] Patient Information 2012 [employer]. 

## 2011-09-23 NOTE — Progress Notes (Signed)
Patient ID: [name], female   DOB: 04-25-1950, 61 y.o.   MRN: [id number] Chief complaint total knee follow-up. 2nd complaint: left trigger finger , recurrent   History this is a follow-up visit. Status post left total knee replacement.  DOS: 2012  Implant DePuy yes Review of systems patient has no complaints.  Exam Physical Exam(6) GENERAL: normal development   CDV: pulses are normal   Skin: normal  Psychiatric: awake, alert and oriented  Neuro: normal sensation  1 ambulation normal no devices  2 ROM = 125 3 Motor normal  4 Stability normal   LEFT thumb evaluation. There is tenderness and swelling over the A1 pulley with clicking with range of motion, which has remained normal. There is no instability. There is a palpable nodule under the A1 pulley. Flexion. Strength is normal. Skin is intact  Separate x-ray report.  Reason for x-ray, and we'll x-ray follow-up knee replacement.  3 views left knee.  The implant is aligned normally. There is no loosening.  Impression normal appearing knee replacement.    Assessment: Knee replacement functioning well    Plan: One year follow up   Inject left thumb   Procedure note trigger finger injection  Diagnosis trigger thumb Postop diagnosis trigger thumbProcedure injection of trigger finger Finger injected left thumb Details of procedure: After verbal consent and timeout to confirm site the LEFT knee was injected with 40 mg of Depo-Medrol and 1 cc and 2 cc 1% lidocaine. The procedure was tolerated well without complication

## 2012-05-28 ENCOUNTER — Other Ambulatory Visit: Payer: Self-pay | Admitting: Family Medicine

## 2012-05-28 DIAGNOSIS — Z139 Encounter for screening, unspecified: Secondary | ICD-10-CM

## 2012-05-31 ENCOUNTER — Other Ambulatory Visit: Payer: Self-pay | Admitting: Family Medicine

## 2012-06-01 ENCOUNTER — Ambulatory Visit (HOSPITAL_COMMUNITY)
Admission: RE | Admit: 2012-06-01 | Discharge: 2012-06-01 | Disposition: A | Discharge status: Home / Self Care | Payer: Medicare Other | Source: Ambulatory Visit | Attending: Family Medicine | Admitting: Family Medicine

## 2012-06-01 DIAGNOSIS — Z1231 Encounter for screening mammogram for malignant neoplasm of breast: Secondary | ICD-10-CM | POA: Insufficient documentation

## 2012-06-01 DIAGNOSIS — Z139 Encounter for screening, unspecified: Secondary | ICD-10-CM

## 2012-07-02 ENCOUNTER — Other Ambulatory Visit: Payer: Self-pay | Admitting: Family Medicine

## 2012-08-02 ENCOUNTER — Other Ambulatory Visit: Payer: Self-pay | Admitting: Family Medicine

## 2012-08-02 NOTE — Telephone Encounter (Signed)
Needs office visit.

## 2012-09-02 ENCOUNTER — Other Ambulatory Visit: Payer: Self-pay | Admitting: Family Medicine

## 2012-09-21 ENCOUNTER — Encounter: Payer: Self-pay | Admitting: Orthopedic Surgery

## 2012-09-21 ENCOUNTER — Ambulatory Visit: Payer: Medicare Other | Admitting: Orthopedic Surgery

## 2012-10-04 ENCOUNTER — Encounter: Payer: Self-pay | Admitting: Family Medicine

## 2012-10-04 ENCOUNTER — Other Ambulatory Visit: Payer: Self-pay | Admitting: Family Medicine

## 2012-10-04 ENCOUNTER — Ambulatory Visit (INDEPENDENT_AMBULATORY_CARE_PROVIDER_SITE_OTHER): Payer: Medicare Other | Admitting: Family Medicine

## 2012-10-04 VITALS — BP 122/64 | Ht 67.0 in | Wt 293.2 lb

## 2012-10-04 DIAGNOSIS — E785 Hyperlipidemia, unspecified: Secondary | ICD-10-CM

## 2012-10-04 DIAGNOSIS — Z23 Encounter for immunization: Secondary | ICD-10-CM

## 2012-10-04 DIAGNOSIS — E1169 Type 2 diabetes mellitus with other specified complication: Secondary | ICD-10-CM | POA: Insufficient documentation

## 2012-10-04 DIAGNOSIS — E119 Type 2 diabetes mellitus without complications: Secondary | ICD-10-CM | POA: Insufficient documentation

## 2012-10-04 LAB — POCT GLYCOSYLATED HEMOGLOBIN (HGB A1C): Hemoglobin A1C: 6.6

## 2012-10-04 NOTE — Progress Notes (Signed)
  Subjective:    Patient ID: [name], female    DOB: 01/28/1950, 62 y.o.   MRN: [id number]  HPI  Here today for f/u visit.  Patient overall relates she is doing fairly well. She states her sugar number state that she does not check it frequently. She relates compliance with her medicine. She denies any low spells. She denies any chest tightness pressure pain shortness breath nausea vomiting her energy level overall is doing fairly good. She also states she takes her other medicines on regular basis tries to eat a healthy diet. No concerns.  The patient was seen today as part of a comprehensive diabetic check up. The patient had the following elements completed: -Review of medication compliance -Review of glucose monitoring results -Review of any complications do to high or low sugars -Diabetic foot exam was completed as part of today's visit. The following was also discussed: -Importance of yearly eye exams -Importance of following diabetic/low sugar-starch diet -Importance of exercise and regular activity -Importance of regular followup visits. -Most recent hemoglobin A1c were reviewed with the patient along with goals regarding diabetes. Past medical history family history reviewed  Review of Systems  Constitutional: Negative for activity change, appetite change and fatigue.  HENT: Negative for congestion and rhinorrhea.   Respiratory: Negative for chest tightness, shortness of breath and stridor.   Cardiovascular: Negative for chest pain.  Gastrointestinal: Negative for abdominal pain.  Musculoskeletal: Negative for joint swelling.  Skin: Negative for rash.       Objective:   Physical Exam  Constitutional: She appears well-developed and well-nourished.  HENT:  Head: Normocephalic and atraumatic.  Neck: Normal range of motion.  Cardiovascular: Normal rate, regular rhythm and normal heart sounds.   Pulmonary/Chest: Effort normal and breath sounds normal.  Abdominal:  Soft.          Assessment & Plan:  Dm- good control Htn - continue current care hyperlip check labs F/u 6 months

## 2012-10-26 ENCOUNTER — Encounter: Payer: Self-pay | Admitting: Orthopedic Surgery

## 2012-10-26 ENCOUNTER — Ambulatory Visit (INDEPENDENT_AMBULATORY_CARE_PROVIDER_SITE_OTHER): Payer: Medicare Other | Admitting: Orthopedic Surgery

## 2012-10-26 ENCOUNTER — Ambulatory Visit (INDEPENDENT_AMBULATORY_CARE_PROVIDER_SITE_OTHER): Payer: Medicare Other

## 2012-10-26 VITALS — BP 109/66 | Ht 67.0 in | Wt 298.0 lb

## 2012-10-26 DIAGNOSIS — Z96652 Presence of left artificial knee joint: Secondary | ICD-10-CM

## 2012-10-26 DIAGNOSIS — Z96659 Presence of unspecified artificial knee joint: Secondary | ICD-10-CM

## 2012-10-26 NOTE — Patient Instructions (Signed)
1 yr xrays tka

## 2012-10-26 NOTE — Progress Notes (Signed)
Patient ID: [name], female   DOB: November 05, 1950, 62 y.o.   MRN: [id number]  Chief Complaint  Patient presents with  . Follow-up    1 year follow up left knee s/p replacement DOS 08/2010     History this is the  2  Yr annual followup status post left knee replacement  The patient is not having any pain in the knee The patient's function with normal activities is excellent   Review of systems musculoskeletal the patient denies catching locking or giving way in the knee    General appearance is normal, the patient is alert and oriented x3 with normal mood and affect. Knee flexion is  120 The knee is stable in the anterior posterior and medial lateral plane There is no tenderness or swelling  Status post left total knee  A followup x-ray will be performed at one year from now  xrays are normal

## 2012-10-28 ENCOUNTER — Other Ambulatory Visit: Payer: Self-pay | Admitting: Family Medicine

## 2012-10-28 LAB — BASIC METABOLIC PANEL
Calcium: 10.5 mg/dL (ref 8.4–10.5)
Creat: 0.87 mg/dL (ref 0.50–1.10)
Sodium: 135 mEq/L (ref 135–145)

## 2012-10-28 LAB — LIPID PANEL
Cholesterol: 273 mg/dL — ABNORMAL HIGH (ref 0–200)
HDL: 49 mg/dL (ref >39)
Total CHOL/HDL Ratio: 5.6 Ratio
Triglycerides: 172 mg/dL — ABNORMAL HIGH (ref <150)
VLDL: 34 mg/dL (ref 0–40)

## 2012-10-28 LAB — HEPATIC FUNCTION PANEL
ALT: 25 U/L (ref 0–35)
AST: 23 U/L (ref 0–37)
Albumin: 4 g/dL (ref 3.5–5.2)
Alkaline Phosphatase: 109 U/L (ref 39–117)
Bilirubin, Direct: <0.1 mg/dL (ref 0.0–0.3)
Total Protein: 7.4 g/dL (ref 6.0–8.3)

## 2012-10-29 ENCOUNTER — Other Ambulatory Visit: Payer: Self-pay | Admitting: *Deleted

## 2012-10-29 MED ORDER — ONDANSETRON HCL 4 MG PO TABS: 4.0000 mg | ORAL_TABLET | Freq: Every day | ORAL | Status: DC | PRN

## 2012-10-29 MED ORDER — ONDANSETRON HCL 4 MG PO TABS: 4.0000 mg | ORAL_TABLET | Freq: Four times a day (QID) | ORAL | Status: DC | PRN

## 2012-11-20 ENCOUNTER — Encounter (HOSPITAL_COMMUNITY): Payer: Self-pay | Admitting: Emergency Medicine

## 2012-11-20 ENCOUNTER — Emergency Department (HOSPITAL_COMMUNITY)
Admission: EM | Admit: 2012-11-20 | Discharge: 2012-11-20 | Disposition: A | Discharge status: Home / Self Care | Payer: Medicare Other | Attending: Emergency Medicine | Admitting: Emergency Medicine

## 2012-11-20 DIAGNOSIS — E7800 Pure hypercholesterolemia, unspecified: Secondary | ICD-10-CM | POA: Insufficient documentation

## 2012-11-20 DIAGNOSIS — E119 Type 2 diabetes mellitus without complications: Secondary | ICD-10-CM | POA: Insufficient documentation

## 2012-11-20 DIAGNOSIS — Z7982 Long term (current) use of aspirin: Secondary | ICD-10-CM | POA: Insufficient documentation

## 2012-11-20 DIAGNOSIS — Z79899 Other long term (current) drug therapy: Secondary | ICD-10-CM | POA: Insufficient documentation

## 2012-11-20 DIAGNOSIS — M129 Arthropathy, unspecified: Secondary | ICD-10-CM | POA: Insufficient documentation

## 2012-11-20 DIAGNOSIS — M51379 Other intervertebral disc degeneration, lumbosacral region without mention of lumbar back pain or lower extremity pain: Secondary | ICD-10-CM | POA: Insufficient documentation

## 2012-11-20 DIAGNOSIS — M5136 Other intervertebral disc degeneration, lumbar region: Secondary | ICD-10-CM

## 2012-11-20 DIAGNOSIS — Z87828 Personal history of other (healed) physical injury and trauma: Secondary | ICD-10-CM | POA: Insufficient documentation

## 2012-11-20 DIAGNOSIS — M5137 Other intervertebral disc degeneration, lumbosacral region: Secondary | ICD-10-CM | POA: Insufficient documentation

## 2012-11-20 DIAGNOSIS — M545 Low back pain, unspecified: Secondary | ICD-10-CM

## 2012-11-20 MED ORDER — KETOROLAC TROMETHAMINE 10 MG PO TABS
10.0000 mg | ORAL_TABLET | Freq: Once | ORAL | Status: AC
  Administered 2012-11-20: 10 mg via ORAL
  Filled 2012-11-20: qty 1

## 2012-11-20 MED ORDER — DEXAMETHASONE 6 MG PO TABS: ORAL_TABLET | ORAL | Status: DC

## 2012-11-20 MED ORDER — METHOCARBAMOL 500 MG PO TABS: ORAL_TABLET | ORAL | Status: DC

## 2012-11-20 MED ORDER — HYDROMORPHONE HCL PF 2 MG/ML IJ SOLN
2.0000 mg | Freq: Once | INTRAMUSCULAR | Status: AC
  Administered 2012-11-20: 2 mg via INTRAMUSCULAR
  Filled 2012-11-20: qty 1

## 2012-11-20 MED ORDER — PROMETHAZINE HCL 12.5 MG PO TABS
12.5000 mg | ORAL_TABLET | Freq: Once | ORAL | Status: AC
  Administered 2012-11-20: 12.5 mg via ORAL
  Filled 2012-11-20: qty 1

## 2012-11-20 MED ORDER — HYDROCODONE-ACETAMINOPHEN 7.5-325 MG PO TABS: 1.0000 | ORAL_TABLET | Freq: Four times a day (QID) | ORAL | Status: DC | PRN
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
## 2012-11-20 NOTE — ED Provider Notes (Signed)
Medical screening examination/treatment/procedure(s) were performed by non-physician practitioner and as supervising physician I was immediately available for consultation/collaboration.  EKG Interpretation   None         Nain Rudd N Gaynelle Pastrana, DO 11/20/12 1604 

## 2012-11-20 NOTE — ED Notes (Signed)
Severe lower back pain began while getting up from sitting position this morning at ~ 1015. Pt is tearful in triage.

## 2012-11-20 NOTE — ED Notes (Signed)
EDP evaluation prior to nurse assessment.

## 2012-11-22 ENCOUNTER — Telehealth: Payer: Self-pay | Admitting: *Deleted

## 2012-11-22 NOTE — Telephone Encounter (Signed)
Pt was complaining of itching. I spoke with Dr. Lorin Picket and he told me to tell her to stop the Norco and to take Tylenol (q6hrs) and Benadryl as needed. Pt verbalized understanding and is bringing meds with her tomorrow for OV.

## 2012-11-23 ENCOUNTER — Encounter: Payer: Self-pay | Admitting: Family Medicine

## 2012-11-23 ENCOUNTER — Ambulatory Visit (INDEPENDENT_AMBULATORY_CARE_PROVIDER_SITE_OTHER): Payer: Medicare Other | Admitting: Family Medicine

## 2012-11-23 VITALS — BP 110/64 | Ht 67.5 in | Wt 300.6 lb

## 2012-11-23 DIAGNOSIS — M545 Low back pain, unspecified: Secondary | ICD-10-CM

## 2012-11-23 NOTE — Progress Notes (Signed)
  Subjective:    Patient ID: [name], female    DOB: 04/21/1950, 62 y.o.   MRN: [id number]  HPIFollow up for back pain. Went to ER for severe back pain. Prescribed hydrocodone, methocarbamol, and dexamethasone. Patient stopped the hydrocodone yesterday because she started itching and feeling sleepy.   She does relate overall she feels a little bit better than what she did she is tolerating the medicines well trying to do some stretching.  Review of Systems Patient denies burning down the leg denies abdominal pain nausea vomiting diarrhea shortness of breath    Objective:   Physical Exam Lumbar back pain more on the left sacroiliac area negative straight leg raise lungs clear heart regular abdomen soft       Assessment & Plan:  Stretching exercises were shown. She is to do these on a regular basis. I believe this will help her. Finish out dexamethasone. Stop taking hydrocodone. Muscle relaxers only went home. Warning signs for progressive illness discussed followup if ongoing illness otherwise followup at next scheduled checkup in early spring 15 minutes spent with patient

## 2012-11-26 ENCOUNTER — Telehealth: Payer: Self-pay | Admitting: *Deleted

## 2012-11-26 NOTE — Telephone Encounter (Signed)
[facility] 11/14 

## 2012-11-26 NOTE — Progress Notes (Signed)
[facility] 11/14 

## 2012-12-03 NOTE — Progress Notes (Signed)
Spoke with patient about her bad cholesterol being severely elevated. Patient states she is NOT taking any cholesterol medications. She is only taking the 2 diabetic meds and her lisinopril. I told her to stay away from fatty/fried foods until we call her in a cholesterol med. Patient verbalized understanding.

## 2012-12-05 ENCOUNTER — Encounter (HOSPITAL_COMMUNITY): Payer: Self-pay | Admitting: Emergency Medicine

## 2012-12-05 ENCOUNTER — Emergency Department (HOSPITAL_COMMUNITY)
Admission: EM | Admit: 2012-12-05 | Discharge: 2012-12-05 | Disposition: A | Discharge status: Home / Self Care | Payer: Medicare Other | Attending: Emergency Medicine | Admitting: Emergency Medicine

## 2012-12-05 DIAGNOSIS — M25559 Pain in unspecified hip: Secondary | ICD-10-CM | POA: Insufficient documentation

## 2012-12-05 DIAGNOSIS — M79609 Pain in unspecified limb: Secondary | ICD-10-CM | POA: Insufficient documentation

## 2012-12-05 DIAGNOSIS — Z87828 Personal history of other (healed) physical injury and trauma: Secondary | ICD-10-CM | POA: Insufficient documentation

## 2012-12-05 DIAGNOSIS — E7800 Pure hypercholesterolemia, unspecified: Secondary | ICD-10-CM | POA: Insufficient documentation

## 2012-12-05 DIAGNOSIS — M1610 Unilateral primary osteoarthritis, unspecified hip: Secondary | ICD-10-CM | POA: Insufficient documentation

## 2012-12-05 DIAGNOSIS — E119 Type 2 diabetes mellitus without complications: Secondary | ICD-10-CM | POA: Insufficient documentation

## 2012-12-05 DIAGNOSIS — IMO0002 Reserved for concepts with insufficient information to code with codable children: Secondary | ICD-10-CM | POA: Insufficient documentation

## 2012-12-05 DIAGNOSIS — E669 Obesity, unspecified: Secondary | ICD-10-CM | POA: Insufficient documentation

## 2012-12-05 DIAGNOSIS — Z96649 Presence of unspecified artificial hip joint: Secondary | ICD-10-CM | POA: Insufficient documentation

## 2012-12-05 DIAGNOSIS — Z96659 Presence of unspecified artificial knee joint: Secondary | ICD-10-CM | POA: Insufficient documentation

## 2012-12-05 DIAGNOSIS — M5430 Sciatica, unspecified side: Secondary | ICD-10-CM | POA: Insufficient documentation

## 2012-12-05 DIAGNOSIS — M5431 Sciatica, right side: Secondary | ICD-10-CM

## 2012-12-05 DIAGNOSIS — Z7982 Long term (current) use of aspirin: Secondary | ICD-10-CM | POA: Insufficient documentation

## 2012-12-05 DIAGNOSIS — Z79899 Other long term (current) drug therapy: Secondary | ICD-10-CM | POA: Insufficient documentation

## 2012-12-05 MED ORDER — OXYCODONE-ACETAMINOPHEN 5-325 MG PO TABS
ORAL_TABLET | ORAL | Status: AC
  Filled 2012-12-05: qty 1

## 2012-12-05 MED ORDER — METHOCARBAMOL 500 MG PO TABS
750.0000 mg | ORAL_TABLET | Freq: Once | ORAL | Status: AC
  Administered 2012-12-05: 750 mg via ORAL
  Filled 2012-12-05: qty 2

## 2012-12-05 MED ORDER — OXYCODONE-ACETAMINOPHEN 5-325 MG PO TABS
1.0000 | ORAL_TABLET | Freq: Once | ORAL | Status: AC
  Administered 2012-12-05: 1 via ORAL

## 2012-12-05 NOTE — ED Notes (Signed)
Pt c/o lower back and right hip pain since last night. Pt states she was seen in ED for same on 11/8. Denies fall/injury.
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
## 2012-12-06 ENCOUNTER — Encounter: Payer: Self-pay | Admitting: Family Medicine

## 2012-12-06 ENCOUNTER — Ambulatory Visit (INDEPENDENT_AMBULATORY_CARE_PROVIDER_SITE_OTHER): Payer: Medicare Other | Admitting: Family Medicine

## 2012-12-06 VITALS — BP 110/70 | Ht 67.5 in | Wt 304.0 lb

## 2012-12-06 DIAGNOSIS — M549 Dorsalgia, unspecified: Secondary | ICD-10-CM

## 2012-12-06 DIAGNOSIS — S39012S Strain of muscle, fascia and tendon of lower back, sequela: Secondary | ICD-10-CM

## 2012-12-06 NOTE — Progress Notes (Signed)
  Subjective:    Patient ID: [name], female    DOB: November 27, 1950, 62 y.o.   MRN: [id number]  Back Pain This is a new problem. The current episode started in the past 7 days. The problem occurs intermittently. The problem is unchanged. The pain is present in the lumbar spine. The quality of the pain is described as aching. The pain is moderate. The pain is the same all the time. Stiffness is present all day. She has tried muscle relaxant for the symptoms. The treatment provided no relief.  Patient was seen at [facility] ER for back pain yesterday. Happened after bowling, went to ER. Severe pain,  Pain today doing better, Advil helped, able to move better PMH back/hip/ knee pain fam hx non contrib  Review of Systems  Musculoskeletal: Positive for back pain.  no fever     Objective:   Physical Exam Her lungs clear hearts regular lower back moderate tenderness in the right lower spine negative straight leg raise fairly good range of motion       Assessment & Plan:  Back exercises were shown. OTC anti-inflammatory when necessary for the next few days If progressive symptoms or worse followup. Regular stretching exercises to try to prevent these in the future. Ligament strain SI joint right side

## 2012-12-07 ENCOUNTER — Other Ambulatory Visit: Payer: Self-pay | Admitting: *Deleted

## 2012-12-07 DIAGNOSIS — E785 Hyperlipidemia, unspecified: Secondary | ICD-10-CM

## 2012-12-07 DIAGNOSIS — Z79899 Other long term (current) drug therapy: Secondary | ICD-10-CM

## 2012-12-07 MED ORDER — ATORVASTATIN CALCIUM 40 MG PO TABS: 40.0000 mg | ORAL_TABLET | Freq: Every day | ORAL | Status: DC

## 2012-12-07 NOTE — Progress Notes (Signed)
Spoke with patient and told her I sent in Atorvastatin 40 mg, # 30 , 5 refills, 1 qd , check lipid and liver in 8 weeks, follow up as planned in 3 months. I then transferred her up front to make 3 month f/u appt. Patient verbalized understanding.

## 2012-12-07 NOTE — ED Provider Notes (Signed)
Medical screening examination/treatment/procedure(s) were performed by non-physician practitioner and as supervising physician I was immediately available for consultation/collaboration.  EKG Interpretation   None        Kairee Kozma, MD 12/07/12 1612 

## 2013-01-14 ENCOUNTER — Other Ambulatory Visit: Payer: Self-pay | Admitting: Family Medicine

## 2013-02-01 ENCOUNTER — Telehealth: Payer: Self-pay | Admitting: Family Medicine

## 2013-02-01 NOTE — Telephone Encounter (Signed)
[facility] will be calling regarding meds.

## 2013-02-03 ENCOUNTER — Other Ambulatory Visit: Payer: Self-pay | Admitting: *Deleted

## 2013-02-03 DIAGNOSIS — R748 Abnormal levels of other serum enzymes: Secondary | ICD-10-CM

## 2013-02-03 LAB — LIPID PANEL
Cholesterol: 180 mg/dL (ref 0–200)
HDL: 47 mg/dL (ref >39)
LDL Cholesterol: 119 mg/dL — ABNORMAL HIGH (ref 0–99)
Total CHOL/HDL Ratio: 3.8 Ratio
Triglycerides: 72 mg/dL (ref <150)
VLDL: 14 mg/dL (ref 0–40)

## 2013-02-03 LAB — HEPATIC FUNCTION PANEL
ALT: 37 U/L — ABNORMAL HIGH (ref 0–35)
AST: 37 U/L (ref 0–37)
Albumin: 3.9 g/dL (ref 3.5–5.2)
Alkaline Phosphatase: 92 U/L (ref 39–117)
Bilirubin, Direct: 0.1 mg/dL (ref 0.0–0.3)
Indirect Bilirubin: 0.3 mg/dL (ref 0.0–0.9)
Total Bilirubin: 0.4 mg/dL (ref 0.3–1.2)
Total Protein: 6.5 g/dL (ref 6.0–8.3)

## 2013-02-04 ENCOUNTER — Other Ambulatory Visit: Payer: Self-pay | Admitting: *Deleted

## 2013-02-04 MED ORDER — LISINOPRIL 5 MG PO TABS: ORAL_TABLET | ORAL | Status: DC

## 2013-02-04 MED ORDER — METFORMIN HCL 500 MG PO TABS: 1000.0000 mg | ORAL_TABLET | Freq: Two times a day (BID) | ORAL | Status: DC

## 2013-02-04 MED ORDER — GLIPIZIDE 5 MG PO TABS: ORAL_TABLET | ORAL | Status: DC

## 2013-02-04 MED ORDER — ATORVASTATIN CALCIUM 40 MG PO TABS: 40.0000 mg | ORAL_TABLET | Freq: Every day | ORAL | Status: DC

## 2013-02-13 LAB — HM DIABETES EYE EXAM

## 2013-03-08 ENCOUNTER — Ambulatory Visit: Payer: Medicare Other | Admitting: Family Medicine

## 2013-03-10 ENCOUNTER — Ambulatory Visit: Payer: Medicare Other | Admitting: Family Medicine

## 2013-03-17 ENCOUNTER — Ambulatory Visit (INDEPENDENT_AMBULATORY_CARE_PROVIDER_SITE_OTHER): Payer: Medicare HMO | Admitting: Family Medicine

## 2013-03-17 ENCOUNTER — Encounter: Payer: Self-pay | Admitting: Family Medicine

## 2013-03-17 VITALS — BP 112/68 | Ht 67.0 in | Wt 299.4 lb

## 2013-03-17 DIAGNOSIS — M722 Plantar fascial fibromatosis: Secondary | ICD-10-CM

## 2013-03-17 DIAGNOSIS — E119 Type 2 diabetes mellitus without complications: Secondary | ICD-10-CM

## 2013-03-17 LAB — POCT GLYCOSYLATED HEMOGLOBIN (HGB A1C): Hemoglobin A1C: 7

## 2013-03-17 MED ORDER — NAPROXEN 500 MG PO TABS
ORAL_TABLET | ORAL | Status: DC
Start: 2013-03-17 — End: 2013-09-16

## 2013-03-17 NOTE — Progress Notes (Signed)
   Subjective:    Patient ID: [name], female    DOB: 06-13-1950, 63 y.o.   MRN: [id number]  Diabetes She presents for her follow-up diabetic visit. She has type 2 diabetes mellitus. She is following a generally healthy diet. She does not see a podiatrist.Eye exam is current (last month).  Patient states she does not check blood sugar.  The patient was seen today as part of a comprehensive diabetic check up. The patient had the following elements completed: -Review of medication compliance -Review of glucose monitoring results -Review of any complications do to high or low sugars -Diabetic foot exam was completed as part of today's visit. The following was also discussed: -Importance of yearly eye exams -Importance of following diabetic/low sugar-starch diet -Importance of exercise and regular activity -Importance of regular followup visits. -Most recent hemoglobin A1c were reviewed with the patient along with goals regarding diabetes.  Having left heel pain.  Hurts when she walks around. She would like to see podiatry for this. She has tried stretching as well as ice compress and without help   Review of Systems Patient denies headache chest pain shortness of breath denies swelling in the legs. Denies rectal bleeding hematuria    Objective:   Physical Exam  Lungs are clear hearts regular abdomen soft extremities no edema foot exam completed bilateral      Assessment & Plan:  #1 diabetes fairly decent control continue current medication she is not certain which medicine she is out of she will notify us of which medicine she is taking so that we can adequately make sure she has her proper scripts.  #2 significant obesity she is encouraged watch diet try to bring her weight down if possible  #3 HTN decent control currently continue current measures. #4 hyperlipidemia continue current medication she will need lab work on next followup. Followup in approximately 4  months  Plantar fasciitis left foot

## 2013-03-17 NOTE — Patient Instructions (Signed)

## 2013-03-18 ENCOUNTER — Telehealth: Payer: Self-pay | Admitting: Family Medicine

## 2013-03-18 NOTE — Telephone Encounter (Signed)
Patient is giving you a list of meds that she has .She was seen 3/5. glitizidr 5mg ,lisinopril 5mg ,atorvascetin 40mg , metformin500mg . She couldn't remember the other medication she was on that she was out of. She stated you would look and send it in for her.

## 2013-05-09 ENCOUNTER — Ambulatory Visit (INDEPENDENT_AMBULATORY_CARE_PROVIDER_SITE_OTHER): Payer: Commercial Managed Care - HMO

## 2013-05-09 ENCOUNTER — Encounter: Payer: Self-pay | Admitting: Podiatry

## 2013-05-09 ENCOUNTER — Telehealth: Payer: Self-pay | Admitting: Family Medicine

## 2013-05-09 ENCOUNTER — Ambulatory Visit (INDEPENDENT_AMBULATORY_CARE_PROVIDER_SITE_OTHER): Payer: Commercial Managed Care - HMO | Admitting: Podiatry

## 2013-05-09 VITALS — BP 123/68 | HR 72 | Resp 18 | Ht 67.0 in | Wt 297.0 lb

## 2013-05-09 DIAGNOSIS — M79609 Pain in unspecified limb: Secondary | ICD-10-CM

## 2013-05-09 DIAGNOSIS — M722 Plantar fascial fibromatosis: Secondary | ICD-10-CM

## 2013-05-09 MED ORDER — TRIAMCINOLONE ACETONIDE 10 MG/ML IJ SUSP
10.0000 mg | Freq: Once | INTRAMUSCULAR | Status: AC
  Administered 2013-05-09: 10 mg

## 2013-05-09 NOTE — Telephone Encounter (Signed)
She has an appointment for 05/23/13.

## 2013-05-09 NOTE — Patient Instructions (Signed)

## 2013-05-09 NOTE — Telephone Encounter (Signed)
Patient needs a referral to her foot doctor, Dr Amalia Hailey due to her [facility] insurance.

## 2013-05-09 NOTE — Telephone Encounter (Signed)
Please refer to sheet about referral attached to chart.

## 2013-05-09 NOTE — Progress Notes (Signed)
   Subjective:    Patient ID: [name], female    DOB: 1950-11-01, 63 y.o.   MRN: [id number]  HPI Comments: N plantar foot pain L left heel and plantar foot, similar symptoms in right D about 1 year O C sharp pain episodes and dull episodes A Worse with long periods of weightbearing, dull mostly constant T Epsom salt soaks  Foot Pain      Review of Systems  All other systems reviewed and are negative.      Objective:   Physical Exam  Orientated x63 63 year old black female  Vascular: DP and PT pulses 2/4 bilaterally.  Neurological: Sensation to 10 g monofilament wire intact 5/5 bilaterally. Vibratory sensation within normal limits. Ankle reflexes reactive bilaterally.  Dermatological: No skin lesions noted  Musculoskeletal: Palpable tenderness medial plantar right and left fascial areas without a palpable lesions. No restriction ankle, subtalar, midtarsal joints bilaterally  X-ray report right foot  Intact bony structure without fracture or dislocation. Posterior calcaneal spur noted. Calcification in the insertional area of the plantar fascia noted.  Radiographic compression: No acute bony abnormality noted in the right foot  X-ray report left foot  Intact bony structure without fracture and/or dislocation noted. Posterior and inferior calcaneal spur noted. Hammer second digit noted.  Radiographic impression: No acute bony abnormality noted left foot      Assessment & Plan:   Assessment: Plantar fasciitis bilaterally The symptoms are exacerbated by obesity  Plan: General Information about plantar fasciitis provided for patient including correct shoeing and stretching. Advised exercise program primarily and water is much is possible.  Reappoint at patient's request

## 2013-05-11 NOTE — Telephone Encounter (Signed)
LMOM to notify pt, no referral is required at this time and she's currently not scheduled to return to Dr. Amalia Hailey, once she has a return appt, we will work on referral at that time

## 2013-05-26 ENCOUNTER — Other Ambulatory Visit: Payer: Self-pay | Admitting: Family Medicine

## 2013-05-26 DIAGNOSIS — Z1231 Encounter for screening mammogram for malignant neoplasm of breast: Secondary | ICD-10-CM

## 2013-05-29 ENCOUNTER — Encounter: Payer: Self-pay | Admitting: Family Medicine

## 2013-05-29 NOTE — Telephone Encounter (Signed)
A letter was being sent to the patient to remind her to followup in June. We will not add any medications currently. We will recheck hemoglobin A1c in June

## 2013-05-30 ENCOUNTER — Other Ambulatory Visit: Payer: Self-pay | Admitting: Family Medicine

## 2013-05-30 DIAGNOSIS — Z1231 Encounter for screening mammogram for malignant neoplasm of breast: Secondary | ICD-10-CM

## 2013-06-02 ENCOUNTER — Ambulatory Visit (HOSPITAL_COMMUNITY): Payer: Medicare HMO

## 2013-06-09 ENCOUNTER — Ambulatory Visit: Payer: Medicare Other

## 2013-06-09 ENCOUNTER — Telehealth: Payer: Self-pay | Admitting: Family Medicine

## 2013-06-09 NOTE — Telephone Encounter (Signed)
Patient wants a special 3D image mammo done at [facility]. Patient has not had breast exam in over a year- office visit scheduled for breast exam and to schedule tests

## 2013-06-09 NOTE — Telephone Encounter (Signed)
Patient would like a referral to have a mammogram to go to [facility].

## 2013-06-14 ENCOUNTER — Ambulatory Visit (INDEPENDENT_AMBULATORY_CARE_PROVIDER_SITE_OTHER): Payer: Commercial Managed Care - HMO | Admitting: Family Medicine

## 2013-06-14 ENCOUNTER — Encounter: Payer: Self-pay | Admitting: Family Medicine

## 2013-06-14 ENCOUNTER — Other Ambulatory Visit: Payer: Self-pay | Admitting: *Deleted

## 2013-06-14 VITALS — BP 134/80 | Ht 67.0 in | Wt 300.0 lb

## 2013-06-14 DIAGNOSIS — E119 Type 2 diabetes mellitus without complications: Secondary | ICD-10-CM

## 2013-06-14 DIAGNOSIS — Z79899 Other long term (current) drug therapy: Secondary | ICD-10-CM

## 2013-06-14 DIAGNOSIS — E1140 Type 2 diabetes mellitus with diabetic neuropathy, unspecified: Secondary | ICD-10-CM

## 2013-06-14 DIAGNOSIS — E785 Hyperlipidemia, unspecified: Secondary | ICD-10-CM

## 2013-06-14 DIAGNOSIS — E1149 Type 2 diabetes mellitus with other diabetic neurological complication: Secondary | ICD-10-CM

## 2013-06-14 DIAGNOSIS — Z23 Encounter for immunization: Secondary | ICD-10-CM

## 2013-06-14 DIAGNOSIS — Z1231 Encounter for screening mammogram for malignant neoplasm of breast: Secondary | ICD-10-CM

## 2013-06-14 DIAGNOSIS — E1142 Type 2 diabetes mellitus with diabetic polyneuropathy: Secondary | ICD-10-CM

## 2013-06-14 DIAGNOSIS — I10 Essential (primary) hypertension: Secondary | ICD-10-CM

## 2013-06-14 NOTE — Progress Notes (Signed)
   Subjective:    Patient ID: [name], female    DOB: 22-Jun-1950, 63 y.o.   MRN: [id number]  HPI Patient is here today to get a referral for a 3D Mammogram.   Pt states she does not have any other questions/concerns today. She does relate that she is having some tingling in her hands when she drives also tingling in her feet. She relates she is taking her medication as directed. She does try to follow a healthy diet She is not able to do a lot of physical exercise.   Review of Systems  Constitutional: Negative for activity change, appetite change and fatigue.  HENT: Negative for congestion.   Respiratory: Negative for cough, choking and shortness of breath.   Cardiovascular: Negative for chest pain.  Gastrointestinal: Negative for abdominal pain.  Endocrine: Negative for polydipsia and polyphagia.  Genitourinary: Negative for frequency.  Neurological: Negative for weakness.  Psychiatric/Behavioral: Negative for confusion.       Objective:   Physical Exam  Vitals reviewed. Constitutional: She appears well-nourished. No distress.  Cardiovascular: Normal rate, regular rhythm and normal heart sounds.   No murmur heard. Pulmonary/Chest: Effort normal and breath sounds normal. No respiratory distress.  Breast exam normal bilateral  Abdominal: Soft. There is no tenderness.  Musculoskeletal: She exhibits no edema.  Lymphadenopathy:    She has no cervical adenopathy.  Neurological: She is alert. She exhibits normal muscle tone.  Psychiatric: Her behavior is normal.          Assessment & Plan:  1. Diabetic neuropathy This patient has some diabetic neuropathy in her feet not severe enough to be on medication best approach keep diabetes under good control  2. Type 2 diabetes mellitus with HbA1C goal below 7.5 Her A1c previously had a good recheck this again in a weeks time followup later this fall watch diet take medication - Hemoglobin A1c - Microalbumin, urine  3.  Hyperlipemia Recheck lipid profile. Continue approach. Watch diet closely - Lipid panel  4. Encounter for long-term (current) use of other medications Because the medications check liver functions - Hepatic function panel  5. HTN (hypertension) Blood pressure good today check kidney functions - Basic metabolic panel  6. Need for prophylactic vaccination with combined diphtheria-tetanus-pertussis (DTP) vaccine Tetanus shot needed - Td vaccine greater than or equal to 7yo preservative free IM  7. Need for prophylactic vaccination and inoculation against other viral diseases(V04.89) Pneumonia vaccine needed - Pneumococcal polysaccharide vaccine 23-valent greater than or equal to 2yo subcutaneous/IM  8. Visit for screening mammogram Patient wanted to have a 3-D mammogram we will try to set this up through her insurance at [employer] [facility]

## 2013-06-16 ENCOUNTER — Telehealth: Payer: Self-pay

## 2013-06-16 NOTE — Telephone Encounter (Signed)
Please schedule 3D mammogram for this patient at the [facility]. No pre cert requried per [place]. Patient might want to check with insurance company to make sure this is covered at 100% first.

## 2013-06-16 NOTE — Telephone Encounter (Signed)
Patient stated she is out of town till next week and will call us back next week after she gets back in town and calls her insurance company.

## 2013-06-30 LAB — HEMOGLOBIN A1C
HEMOGLOBIN A1C: 6.7 % — AB (ref <5.7)
Mean Plasma Glucose: 146 mg/dL — ABNORMAL HIGH (ref <117)

## 2013-07-01 LAB — HEPATIC FUNCTION PANEL
ALT: 21 U/L (ref 0–35)
AST: 20 U/L (ref 0–37)
Albumin: 3.8 g/dL (ref 3.5–5.2)
Alkaline Phosphatase: 101 U/L (ref 39–117)
Bilirubin, Direct: 0.1 mg/dL (ref 0.0–0.3)
Indirect Bilirubin: 0.2 mg/dL (ref 0.2–1.2)
Total Bilirubin: 0.3 mg/dL (ref 0.2–1.2)
Total Protein: 6.8 g/dL (ref 6.0–8.3)

## 2013-07-01 LAB — BASIC METABOLIC PANEL
BUN: 10 mg/dL (ref 6–23)
CHLORIDE: 103 meq/L (ref 96–112)
CO2: 24 mEq/L (ref 19–32)
Calcium: 9.1 mg/dL (ref 8.4–10.5)
Creat: 0.83 mg/dL (ref 0.50–1.10)
Glucose, Bld: 126 mg/dL — ABNORMAL HIGH (ref 70–99)
Potassium: 4 mEq/L (ref 3.5–5.3)
Sodium: 138 mEq/L (ref 135–145)

## 2013-07-01 LAB — LIPID PANEL
Cholesterol: 242 mg/dL — ABNORMAL HIGH (ref 0–200)
HDL: 49 mg/dL (ref >39)
LDL Cholesterol: 168 mg/dL — ABNORMAL HIGH (ref 0–99)
Total CHOL/HDL Ratio: 4.9 Ratio
Triglycerides: 125 mg/dL (ref <150)
VLDL: 25 mg/dL (ref 0–40)

## 2013-07-01 LAB — MICROALBUMIN, URINE: Microalb, Ur: 0.5 mg/dL (ref 0.00–1.89)

## 2013-07-06 MED ORDER — ATORVASTATIN CALCIUM 80 MG PO TABS: 80.0000 mg | ORAL_TABLET | Freq: Every day | ORAL | Status: DC

## 2013-07-06 NOTE — Addendum Note (Signed)
Addended byCharolotte Capuchin D on: 07/06/2013 09:43 AM   Modules accepted: Orders

## 2013-07-14 ENCOUNTER — Ambulatory Visit (HOSPITAL_COMMUNITY)
Admission: RE | Admit: 2013-07-14 | Discharge: 2013-07-14 | Disposition: A | Discharge status: Home / Self Care | Payer: Medicare HMO | Source: Ambulatory Visit | Attending: Family Medicine | Admitting: Family Medicine

## 2013-07-14 DIAGNOSIS — Z1231 Encounter for screening mammogram for malignant neoplasm of breast: Secondary | ICD-10-CM | POA: Insufficient documentation

## 2013-09-14 ENCOUNTER — Encounter (HOSPITAL_COMMUNITY): Payer: Self-pay | Admitting: Emergency Medicine

## 2013-09-14 ENCOUNTER — Emergency Department (HOSPITAL_COMMUNITY): Payer: Medicare HMO

## 2013-09-14 ENCOUNTER — Inpatient Hospital Stay (HOSPITAL_COMMUNITY)
Admission: EM | Admit: 2013-09-14 | Discharge: 2013-09-18 | DRG: 638 | Disposition: A | Discharge status: Home Health | Payer: Medicare HMO | Attending: Family Medicine | Admitting: Family Medicine

## 2013-09-14 DIAGNOSIS — Z96649 Presence of unspecified artificial hip joint: Secondary | ICD-10-CM

## 2013-09-14 DIAGNOSIS — E7800 Pure hypercholesterolemia, unspecified: Secondary | ICD-10-CM | POA: Diagnosis present

## 2013-09-14 DIAGNOSIS — Z96659 Presence of unspecified artificial knee joint: Secondary | ICD-10-CM | POA: Diagnosis not present

## 2013-09-14 DIAGNOSIS — Z6841 Body Mass Index (BMI) 40.0 and over, adult: Secondary | ICD-10-CM | POA: Diagnosis not present

## 2013-09-14 DIAGNOSIS — Z8249 Family history of ischemic heart disease and other diseases of the circulatory system: Secondary | ICD-10-CM | POA: Diagnosis not present

## 2013-09-14 DIAGNOSIS — E1310 Other specified diabetes mellitus with ketoacidosis without coma: Principal | ICD-10-CM | POA: Diagnosis present

## 2013-09-14 DIAGNOSIS — I10 Essential (primary) hypertension: Secondary | ICD-10-CM | POA: Diagnosis present

## 2013-09-14 DIAGNOSIS — Z9119 Patient's noncompliance with other medical treatment and regimen: Secondary | ICD-10-CM

## 2013-09-14 DIAGNOSIS — E1110 Type 2 diabetes mellitus with ketoacidosis without coma: Secondary | ICD-10-CM | POA: Diagnosis present

## 2013-09-14 DIAGNOSIS — R748 Abnormal levels of other serum enzymes: Secondary | ICD-10-CM | POA: Diagnosis present

## 2013-09-14 DIAGNOSIS — R7309 Other abnormal glucose: Secondary | ICD-10-CM | POA: Diagnosis present

## 2013-09-14 DIAGNOSIS — Z91199 Patient's noncompliance with other medical treatment and regimen due to unspecified reason: Secondary | ICD-10-CM

## 2013-09-14 DIAGNOSIS — E876 Hypokalemia: Secondary | ICD-10-CM | POA: Diagnosis not present

## 2013-09-14 DIAGNOSIS — M129 Arthropathy, unspecified: Secondary | ICD-10-CM | POA: Diagnosis present

## 2013-09-14 DIAGNOSIS — E669 Obesity, unspecified: Secondary | ICD-10-CM | POA: Diagnosis present

## 2013-09-14 DIAGNOSIS — S68118A Complete traumatic metacarpophalangeal amputation of other finger, initial encounter: Secondary | ICD-10-CM | POA: Diagnosis not present

## 2013-09-14 DIAGNOSIS — E785 Hyperlipidemia, unspecified: Secondary | ICD-10-CM | POA: Diagnosis present

## 2013-09-14 DIAGNOSIS — E1169 Type 2 diabetes mellitus with other specified complication: Secondary | ICD-10-CM | POA: Diagnosis present

## 2013-09-14 DIAGNOSIS — E119 Type 2 diabetes mellitus without complications: Secondary | ICD-10-CM | POA: Diagnosis present

## 2013-09-14 HISTORY — DX: Obesity, unspecified: E66.9

## 2013-09-14 LAB — BASIC METABOLIC PANEL
ANION GAP: 13 (ref 5–15)
Anion gap: 13 (ref 5–15)
Anion gap: 13 (ref 5–15)
Anion gap: 14 (ref 5–15)
BUN: 13 mg/dL (ref 6–23)
BUN: 13 mg/dL (ref 6–23)
BUN: 11 mg/dL (ref 6–23)
BUN: 10 mg/dL (ref 6–23)
CHLORIDE: 106 meq/L (ref 96–112)
CO2: 27 mEq/L (ref 19–32)
CO2: 28 mEq/L (ref 19–32)
CO2: 21 mEq/L (ref 19–32)
CO2: 25 mEq/L (ref 19–32)
Calcium: 9.5 mg/dL (ref 8.4–10.5)
Calcium: 9.6 mg/dL (ref 8.4–10.5)
Calcium: 9.1 mg/dL (ref 8.4–10.5)
Calcium: 9.2 mg/dL (ref 8.4–10.5)
Chloride: 107 mEq/L (ref 96–112)
Chloride: 108 mEq/L (ref 96–112)
Chloride: 106 mEq/L (ref 96–112)
Creatinine, Ser: 0.93 mg/dL (ref 0.50–1.10)
Creatinine, Ser: 0.92 mg/dL (ref 0.50–1.10)
Creatinine, Ser: 0.81 mg/dL (ref 0.50–1.10)
Creatinine, Ser: 0.79 mg/dL (ref 0.50–1.10)
GFR calc Af Amer: 74 mL/min — ABNORMAL LOW (ref >90)
GFR calc non Af Amer: 64 mL/min — ABNORMAL LOW (ref >90)
GFR calc non Af Amer: 65 mL/min — ABNORMAL LOW (ref >90)
GFR, EST AFRICAN AMERICAN: 75 mL/min — AB (ref >90)
GFR, EST AFRICAN AMERICAN: 88 mL/min — AB (ref >90)
GFR, EST NON AFRICAN AMERICAN: 76 mL/min — AB (ref >90)
GFR, EST NON AFRICAN AMERICAN: 87 mL/min — AB (ref >90)
GLUCOSE: 326 mg/dL — AB (ref 70–99)
GLUCOSE: 244 mg/dL — AB (ref 70–99)
Glucose, Bld: 224 mg/dL — ABNORMAL HIGH (ref 70–99)
Glucose, Bld: 151 mg/dL — ABNORMAL HIGH (ref 70–99)
POTASSIUM: 4.1 meq/L (ref 3.7–5.3)
POTASSIUM: 4.1 meq/L (ref 3.7–5.3)
POTASSIUM: 4.4 meq/L (ref 3.7–5.3)
POTASSIUM: 3.7 meq/L (ref 3.7–5.3)
SODIUM: 147 meq/L (ref 137–147)
SODIUM: 149 meq/L — AB (ref 137–147)
SODIUM: 141 meq/L (ref 137–147)
SODIUM: 144 meq/L (ref 137–147)

## 2013-09-14 LAB — GLUCOSE, CAPILLARY
GLUCOSE-CAPILLARY: 138 mg/dL — AB (ref 70–99)
GLUCOSE-CAPILLARY: 147 mg/dL — AB (ref 70–99)
GLUCOSE-CAPILLARY: 215 mg/dL — AB (ref 70–99)
Glucose-Capillary: 264 mg/dL — ABNORMAL HIGH (ref 70–99)
Glucose-Capillary: 265 mg/dL — ABNORMAL HIGH (ref 70–99)
Glucose-Capillary: 192 mg/dL — ABNORMAL HIGH (ref 70–99)
Glucose-Capillary: 155 mg/dL — ABNORMAL HIGH (ref 70–99)
Glucose-Capillary: 140 mg/dL — ABNORMAL HIGH (ref 70–99)

## 2013-09-14 LAB — CBC WITH DIFFERENTIAL/PLATELET
BASOS ABS: 0 10*3/uL (ref 0.0–0.1)
BASOS PCT: 0 % (ref 0–1)
EOS ABS: 0.1 10*3/uL (ref 0.0–0.7)
EOS PCT: 1 % (ref 0–5)
HEMATOCRIT: 41.8 % (ref 36.0–46.0)
HEMOGLOBIN: 14.1 g/dL (ref 12.0–15.0)
Lymphocytes Relative: 27 % (ref 12–46)
Lymphs Abs: 2.7 10*3/uL (ref 0.7–4.0)
MCH: 28.8 pg (ref 26.0–34.0)
MCHC: 33.7 g/dL (ref 30.0–36.0)
MCV: 85.5 fL (ref 78.0–100.0)
MONOS PCT: 6 % (ref 3–12)
Monocytes Absolute: 0.6 10*3/uL (ref 0.1–1.0)
Neutro Abs: 6.6 10*3/uL (ref 1.7–7.7)
Neutrophils Relative %: 66 % (ref 43–77)
Platelets: ADEQUATE 10*3/uL (ref 150–400)
RBC: 4.89 MIL/uL (ref 3.87–5.11)
RDW: 13.8 % (ref 11.5–15.5)
WBC: 10 10*3/uL (ref 4.0–10.5)

## 2013-09-14 LAB — URINALYSIS, ROUTINE W REFLEX MICROSCOPIC
Bilirubin Urine: NEGATIVE
LEUKOCYTES UA: NEGATIVE
Nitrite: NEGATIVE
PROTEIN: NEGATIVE mg/dL
Specific Gravity, Urine: <1.005 — ABNORMAL LOW (ref 1.005–1.030)
UROBILINOGEN UA: 0.2 mg/dL (ref 0.0–1.0)
pH: 5.5 (ref 5.0–8.0)

## 2013-09-14 LAB — CBG MONITORING, ED
GLUCOSE-CAPILLARY: 566 mg/dL — AB (ref 70–99)
GLUCOSE-CAPILLARY: 450 mg/dL — AB (ref 70–99)
Glucose-Capillary: >600 mg/dL (ref 70–99)
Glucose-Capillary: 477 mg/dL — ABNORMAL HIGH (ref 70–99)
Glucose-Capillary: 393 mg/dL — ABNORMAL HIGH (ref 70–99)

## 2013-09-14 LAB — COMPREHENSIVE METABOLIC PANEL
ALBUMIN: 4.3 g/dL (ref 3.5–5.2)
ALT: 39 U/L — ABNORMAL HIGH (ref 0–35)
AST: 29 U/L (ref 0–37)
Alkaline Phosphatase: 238 U/L — ABNORMAL HIGH (ref 39–117)
BUN: 18 mg/dL (ref 6–23)
CALCIUM: 10.1 mg/dL (ref 8.4–10.5)
CO2: 22 mEq/L (ref 19–32)
CREATININE: 1.03 mg/dL (ref 0.50–1.10)
Chloride: 95 mEq/L — ABNORMAL LOW (ref 96–112)
GFR calc Af Amer: 66 mL/min — ABNORMAL LOW (ref >90)
GFR calc non Af Amer: 57 mL/min — ABNORMAL LOW (ref >90)
Glucose, Bld: 824 mg/dL (ref 70–99)
Potassium: 4.8 mEq/L (ref 3.7–5.3)
Sodium: 138 mEq/L (ref 137–147)
Total Bilirubin: 0.5 mg/dL (ref 0.3–1.2)
Total Protein: 9.2 g/dL — ABNORMAL HIGH (ref 6.0–8.3)

## 2013-09-14 LAB — LIPID PANEL
CHOLESTEROL: 239 mg/dL — AB (ref 0–200)
HDL: 46 mg/dL (ref >39)
LDL Cholesterol: 162 mg/dL — ABNORMAL HIGH (ref 0–99)
Total CHOL/HDL Ratio: 5.2 RATIO
Triglycerides: 153 mg/dL — ABNORMAL HIGH (ref <150)
VLDL: 31 mg/dL (ref 0–40)

## 2013-09-14 LAB — URINE MICROSCOPIC-ADD ON

## 2013-09-14 LAB — MRSA PCR SCREENING: MRSA BY PCR: NEGATIVE

## 2013-09-14 MED ORDER — SODIUM CHLORIDE 0.9 % IV SOLN: INTRAVENOUS | Status: DC

## 2013-09-14 MED ORDER — POTASSIUM CHLORIDE 10 MEQ/100ML IV SOLN: 10.0000 meq | INTRAVENOUS | Status: DC

## 2013-09-14 MED ORDER — SODIUM CHLORIDE 0.9 % IV SOLN
INTRAVENOUS | Status: DC
  Administered 2013-09-14: 11:00:00 via INTRAVENOUS

## 2013-09-14 MED ORDER — DEXTROSE 50 % IV SOLN: 25.0000 mL | INTRAVENOUS | Status: DC | PRN

## 2013-09-14 MED ORDER — DEXTROSE-NACL 5-0.45 % IV SOLN: INTRAVENOUS | Status: DC

## 2013-09-14 MED ORDER — SODIUM CHLORIDE 0.9 % IV SOLN
INTRAVENOUS | Status: DC
  Administered 2013-09-14: 5.1 [IU]/h via INTRAVENOUS
  Filled 2013-09-14: qty 2.5

## 2013-09-14 MED ORDER — ZOLPIDEM TARTRATE 5 MG PO TABS: 5.0000 mg | ORAL_TABLET | Freq: Every evening | ORAL | Status: DC | PRN

## 2013-09-14 MED ORDER — ENOXAPARIN SODIUM 40 MG/0.4ML ~~LOC~~ SOLN
40.0000 mg | SUBCUTANEOUS | Status: DC
  Administered 2013-09-14 – 2013-09-17 (×4): 40 mg via SUBCUTANEOUS
  Filled 2013-09-14 (×4): qty 0.4

## 2013-09-14 MED ORDER — LISINOPRIL 5 MG PO TABS
2.5000 mg | ORAL_TABLET | Freq: Every day | ORAL | Status: DC
  Administered 2013-09-14: 2.5 mg via ORAL
  Filled 2013-09-14: qty 1

## 2013-09-14 MED ORDER — INSULIN REGULAR BOLUS VIA INFUSION
0.0000 [IU] | Freq: Three times a day (TID) | INTRAVENOUS | Status: DC
  Filled 2013-09-14: qty 10

## 2013-09-14 MED ORDER — DEXTROSE-NACL 5-0.45 % IV SOLN
INTRAVENOUS | Status: DC
  Administered 2013-09-14: 17:00:00 via INTRAVENOUS

## 2013-09-14 MED ORDER — ONDANSETRON HCL 4 MG/2ML IJ SOLN: 4.0000 mg | Freq: Four times a day (QID) | INTRAMUSCULAR | Status: DC | PRN

## 2013-09-14 MED ORDER — ACETAMINOPHEN 325 MG PO TABS: 650.0000 mg | ORAL_TABLET | Freq: Four times a day (QID) | ORAL | Status: DC | PRN

## 2013-09-14 MED ORDER — SODIUM CHLORIDE 0.9 % IV BOLUS (SEPSIS)
2000.0000 mL | Freq: Once | INTRAVENOUS | Status: AC
  Administered 2013-09-14: 2000 mL via INTRAVENOUS

## 2013-09-14 MED ORDER — ATORVASTATIN CALCIUM 40 MG PO TABS
80.0000 mg | ORAL_TABLET | Freq: Every evening | ORAL | Status: DC
  Administered 2013-09-14 – 2013-09-17 (×4): 80 mg via ORAL
  Filled 2013-09-14 (×4): qty 2

## 2013-09-14 MED ORDER — SODIUM CHLORIDE 0.9 % IV SOLN
INTRAVENOUS | Status: DC
  Filled 2013-09-14: qty 2.5
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
## 2013-09-14 NOTE — ED Notes (Addendum)
Pt states has been experiencing generalized weakness since Sunday morning, states the weakness comes and goes, weakness is generalized. Pt is a diabetic, should be on insulin, pt stopped taking insulin and checking glucose last year. Pt's glucose registered greater than 600 at triage. Pt also co productive cough, yellow sputum.

## 2013-09-14 NOTE — Progress Notes (Signed)
[name] daughter carried purse with any belongings other than clothing that patient had with her home. Patient was awake and asked daughter to take purse with her home.

## 2013-09-14 NOTE — H&P (Signed)
Patient seen, independently examined and chart reviewed. I agree with exam, assessment and plan discussed with Dyanne Carrel, NP.  63 year old woman with history of diabetes mellitus type 2, noncompliance (diet--including gatorades and ice cream in the last 24 hours, not checking her sugars, medication compliance questioned) who presented with polyuria, polydipsia, polyphagia. She has not been checking her blood sugars lately.  She denies ever being on insulin.  PMH DM type 2 HTN  Objective: Afebrile, vital signs stable  Gen. Appears calm and comfortable.  Psych. Alert. Speech fluent and clear.  Eyes. Pupils equal, round, react to light. Normal lids, irises.  ENT. Lips and tongue appear unremarkable.  Neck. No lymphadenopathy or masses. No thyromegaly.  Cardiovascular. Regular rate and rhythm. No murmur, rub or gallop.  Respiratory. Clear to auscultation bilaterally. No wheezes, rales or rhonchi. Normal respiratory effort.  Abdomen. Soft  Skin. No rash or induration  Musculoskeletal. Grossly unremarkable  Neurologic. Grossly unremarkable   Chemistry: Initial anion gap elevated at 21, has now closed to 13  Heme: CBC unremarkable  ID: Urinalysis was equivocal  Imaging: Chest x-ray was unremarkable  She appears stable at this point. Plan admission for DKA. While noncompliance is suggested by her dietary history, lack of blood sugar monitoring and questionable adherence to medication, it is interesting to note that her hemoglobin A1c has been well controlled over the last year. She has no signs or symptoms to suggest infection, ACS, acute neurologic event or precipitating event. No abdominal pain, nausea or vomiting. Plan insulin infusion, correct acidosis; will need diet education, diabetes RN.  When ready, transition to SSI, low dose long-acting.  Murray Hodgkins, MD [facility] [phone]

## 2013-09-14 NOTE — ED Notes (Addendum)
Spoke with MD on CBG critical value of 566. Awaiting orders. Will continue to monitor.

## 2013-09-15 DIAGNOSIS — E1310 Other specified diabetes mellitus with ketoacidosis without coma: Principal | ICD-10-CM

## 2013-09-15 DIAGNOSIS — E876 Hypokalemia: Secondary | ICD-10-CM | POA: Diagnosis not present

## 2013-09-15 DIAGNOSIS — R748 Abnormal levels of other serum enzymes: Secondary | ICD-10-CM | POA: Diagnosis present

## 2013-09-15 DIAGNOSIS — E119 Type 2 diabetes mellitus without complications: Secondary | ICD-10-CM

## 2013-09-15 LAB — HEMOGLOBIN A1C
Hgb A1c MFr Bld: 12.9 % — ABNORMAL HIGH (ref <5.7)
MEAN PLASMA GLUCOSE: 324 mg/dL — AB (ref <117)

## 2013-09-15 LAB — GLUCOSE, CAPILLARY
GLUCOSE-CAPILLARY: 132 mg/dL — AB (ref 70–99)
GLUCOSE-CAPILLARY: 159 mg/dL — AB (ref 70–99)
GLUCOSE-CAPILLARY: 173 mg/dL — AB (ref 70–99)
Glucose-Capillary: 174 mg/dL — ABNORMAL HIGH (ref 70–99)
Glucose-Capillary: 161 mg/dL — ABNORMAL HIGH (ref 70–99)
Glucose-Capillary: 190 mg/dL — ABNORMAL HIGH (ref 70–99)
Glucose-Capillary: 242 mg/dL — ABNORMAL HIGH (ref 70–99)
Glucose-Capillary: 302 mg/dL — ABNORMAL HIGH (ref 70–99)
Glucose-Capillary: 276 mg/dL — ABNORMAL HIGH (ref 70–99)
Glucose-Capillary: 264 mg/dL — ABNORMAL HIGH (ref 70–99)

## 2013-09-15 LAB — BASIC METABOLIC PANEL
ANION GAP: 10 (ref 5–15)
BUN: 10 mg/dL (ref 6–23)
CALCIUM: 8.8 mg/dL (ref 8.4–10.5)
CO2: 27 mEq/L (ref 19–32)
CREATININE: 0.82 mg/dL (ref 0.50–1.10)
Chloride: 108 mEq/L (ref 96–112)
GFR, EST AFRICAN AMERICAN: 86 mL/min — AB (ref >90)
GFR, EST NON AFRICAN AMERICAN: 75 mL/min — AB (ref >90)
Glucose, Bld: 188 mg/dL — ABNORMAL HIGH (ref 70–99)
Potassium: 3.6 mEq/L — ABNORMAL LOW (ref 3.7–5.3)
Sodium: 145 mEq/L (ref 137–147)

## 2013-09-15 MED ORDER — INSULIN ASPART 100 UNIT/ML ~~LOC~~ SOLN
0.0000 [IU] | Freq: Three times a day (TID) | SUBCUTANEOUS | Status: DC
  Administered 2013-09-15: 8 [IU] via SUBCUTANEOUS

## 2013-09-15 MED ORDER — INSULIN ASPART 100 UNIT/ML ~~LOC~~ SOLN
0.0000 [IU] | Freq: Every day | SUBCUTANEOUS | Status: DC
  Administered 2013-09-15: 3 [IU] via SUBCUTANEOUS

## 2013-09-15 MED ORDER — INSULIN DETEMIR 100 UNIT/ML ~~LOC~~ SOLN
20.0000 [IU] | Freq: Every day | SUBCUTANEOUS | Status: DC
  Administered 2013-09-15 – 2013-09-16 (×2): 20 [IU] via SUBCUTANEOUS
  Filled 2013-09-15 (×5): qty 0.2

## 2013-09-15 MED ORDER — INSULIN ASPART 100 UNIT/ML ~~LOC~~ SOLN
0.0000 [IU] | Freq: Three times a day (TID) | SUBCUTANEOUS | Status: DC
  Administered 2013-09-15: 3 [IU] via SUBCUTANEOUS
  Administered 2013-09-15: 7 [IU] via SUBCUTANEOUS

## 2013-09-15 MED ORDER — GLIPIZIDE 5 MG PO TABS
5.0000 mg | ORAL_TABLET | Freq: Two times a day (BID) | ORAL | Status: DC
  Administered 2013-09-15 – 2013-09-18 (×7): 5 mg via ORAL
  Filled 2013-09-15 (×7): qty 1

## 2013-09-15 MED ORDER — POTASSIUM CHLORIDE CRYS ER 20 MEQ PO TBCR
40.0000 meq | EXTENDED_RELEASE_TABLET | Freq: Once | ORAL | Status: AC
  Administered 2013-09-15: 40 meq via ORAL
  Filled 2013-09-15: qty 2

## 2013-09-15 MED ORDER — LIVING WELL WITH DIABETES BOOK
Freq: Once | Status: AC
  Administered 2013-09-15: 16:00:00
  Filled 2013-09-15: qty 1

## 2013-09-15 NOTE — Progress Notes (Signed)
Patient seen, independently examined and chart reviewed. I agree with exam, assessment and plan discussed with Dyanne Carrel, NP.  Subjective: Poor sleep last night. Feels well though, no pain, n/v/abd pain. Tolerating liquids.  Denies taking DM meds (reported to Ms. Black that she did take meds).  Objective: Afebrile, vital signs are stable with borderline low blood pressure intermittently.  Gen. Appears calm, comfortable.  Psych. Bright affect, speech fluent and clear.  Cardiovascular. Regular rate and rhythm. No murmur, rub or gallop. No LE edema.  Respiratory. CTA bilaterally, no w/r/r. Normal resp effort.  Abdomen. soft     I/O: adequate UOP  Chemistry: BMP unremarkable, AG 10. K+ 3.6. Hgb A1c 12.9  She has improved rapidly with resolution of DKA. Her history is vague and confusing, review of the record demonstrates well controlled hemoglobin A1c with several data points over the last year. In the last 3 months her hemoglobin A1c has gone from 6.7 >> 12.9. It is unclear whether she is taking her oral meds consistently or even at all.  Given these uncertainties, plan transition to sliding scale insulin, resume oral glipizide, transfer to floor, observe additional 24 hours to assess insulin requirement. Will go ahead and have nursing do insulin administration teaching in case she goes home on insulin. Would anticipate discharge tomorrow.  I have asked the patient to have her family bring in all her medications so these can be reviewed.  Jocelyn Hodgkins, MD [facility] [phone]

## 2013-09-15 NOTE — Clinical Documentation Improvement (Signed)
Please clarify the underlying condition link BMI=42.72 and document in pn or d/c summary   Possible Clinical conditions  Morbid Obesity W/ BMI  Underweight w/BMI  Other condition___________________  Cannot clinically determine _____________  Risk Factors: Sign & Symptoms: DKA,  Obesity per ED note 09/14/13 Diagnostics: HT=5\' 8"  (1.727 m)  WT=278 lb 14.1 oz (126.5 kg)  BMI=42.41 kg/m2  Treatment Diet heart healthy/carb modified (   Strict intake and output    Thank You, Heloise Beecham ,RN Clinical Documentation Specialist:  [place] Information Management

## 2013-09-15 NOTE — Care Management Note (Addendum)
    Page 1 of 2   09/16/2013     2:13:55 PM CARE MANAGEMENT NOTE 09/16/2013  Patient:  [name], [name]   Account Number:  [id number]  Date Initiated:  09/15/2013  Documentation initiated by:  Theophilus Kinds  Subjective/Objective Assessment:   Pt admitted from home with DKA. Pt lives alone and will return home at discharge. Pt is independent with ADl's. Pt has a glucometer for home use. Pts PCP is [place] and pt stated that all of her meds come via mail.     Action/Plan:   HH RN for DM teaching and compliance. Pt chooses [facility] RN. Romualdo Bolk of [facility] is aware and will collect the pts information from the chart. No DME needs noted.   Anticipated DC Date:  09/16/2013   Anticipated DC Plan:  [place]  CM consult      [facility] Choice  HOME HEALTH   Choice offered to / List presented to:  C-1 Patient        [place] arranged  HH-1 RN  [place].   Status of service:  Completed, signed off Medicare Important Message given?  YES (If response is "NO", the following Medicare IM given date fields will be blank) Date Medicare IM given:  09/16/2013 Medicare IM given by:  Jolene Provost Date Additional Medicare IM given:   Additional Medicare IM given by:    Discharge Disposition:  [place]  Per UR Regulation:    If discussed at Long Length of Stay Meetings, dates discussed:    Comments:  09/16/2013 [place], RN, MSN, PCCN Pt plans to dsicharge home tomorrow with self care. Benifits check for cost of Levemir is approx $6.60 cents per vial. Pt concerned that she is required to have medications filled through mail order, pt calling [employer] to verify that pt will be able to get mediation filled until mail-order medication can be recieved. Pt will have [facility] RN to follow at discharge if problems arise. Notes left to weekend RN to call [facility] when pt is discharged. No further  CM needs at this time.  09/15/13 [place], RN BSN CM

## 2013-09-15 NOTE — Progress Notes (Signed)
Spoke with patient about diabetes and home regimen for diabetes control. Patient reports that she is followed by her PCP (Dr. Wolfgang Phoenix) for diabetes management and she is prescribed oral medications for her diabetes. She was not able to tell me the names of the medications but according to the chart she is prescribed Glipizide 5 mg BID and Metformin 1000 mg BID for diabetes control.  Patient reports that she does not regularly take the oral medications, she was taking one dose one day and skipping doses. Inquired about why she was not taking the medication regularly and she states that she "just doesn't like to take pills". Inquired about monitoring blood glucose and she reports that she use to check her glucose every morning but she stopped testing some time last year because "it was always running good". Inquired about normal glucose values and patient was not able to tell me what normal glucose values were nor was she able to tell me what her blood glucose was running before she stopped checking it. Inquired about knowledge about A1C and patient reports that she does not know what an A1C is. Discussed A1C results (12.9% on 09/14/13) and explained what an A1C is, basic pathophysiology of DM Type 2, basic home care, importance of checking CBGs and maintaining good CBG control to prevent long-term and short-term complications. Discussed impact of nutrition, exercise, stress, sickness, and medications on diabetes control.  Inquired about any prior use of insulin and patient reports that she has never been on insulin. Discussed the possibility of starting insulin and patient states that she is agreeable to do whatever the doctor tells her to do. She is adamant that she will take her diabetes serious and she states that she will check her glucose 3 times per day and she will take whatever medicine the doctors tells her too. She reports that she felt bad for over a week and "this really scared" her and she states that she  is eager to do whatever she needs to do. In talking to her about how uncontrolled diabetes can cause damage throughout the body she reported that she has very painful feet and that she got "a shot" in her foot in May by a podiatrist. The shot the patient received was likely a steroid shot which could be impacting the elevated A1C value. Informed patient I would talk with the doctor and see what the plan for discharge would be and follow up with her tomorrow.  Patient verbalized understanding of information discussed and she states that she has no further questions at this time related to diabetes.   Talked with Dr. Sarajane Jews and he feels that the patient will have to be discharged on insulin. Therefore, talked with Tammy, RN, CM and asked that she check on the co-pay for Levemir insulin pens through patient's insurance. Tammy, RN, CM reports that she will also set up [facility] to come out to ensure patient is checking glucose and taking insulin as directed. Talked with Loma Sousa, RN and asked that patient be allowed to give herself insulin injections so she can become comfortable with self injections prior to discharge. Plan to see patient again in the morning and teach insulin pen teaching.  Thanks, Barnie Alderman, RN, MSN, CCRN Diabetes Coordinator Inpatient Diabetes Program [phone] (Team Pager) [phone] (AP office) [phone] [facility] office)

## 2013-09-15 NOTE — Progress Notes (Signed)
UR chart review completed.  

## 2013-09-15 NOTE — Progress Notes (Signed)
[facility] PROGRESS NOTE  [name] IBB:[id number] DOB: 04-12-1950 DOA: 09/14/2013 PCP: Sallee Lange, MD   Summary: 63 year old woman with history of diabetes mellitus type 2, noncompliance (diet--including gatorades and ice cream in the last 24 hours, not checking her sugars, medication compliance questioned) who presented with polyuria, polydipsia, polyphagia. She has not been checking her blood sugars lately.    Assessment/Plan: DKA, type 2:  Likely related to non-compliance with meds and diet. Information from patient inconsistent and unreliable.  No signs or symptoms of infectious process. A1c 12.9 which is pronounced increase from previous levels over last year. Gap closed this am. Will discontinue insulin gtt. Will resume glipizide and use SSI for optimal control. Will advance diet. Await consult/recommendations from Diabetes coordinator. Will need close OP follow up at discharge.    Active Problems:  Type 2 diabetes mellitus with HbA1C goal below 7.5. See #1. Patient likely will need to continue insulin at discharge. Patient verbalizes willingness to self administer. Nutritional consult and Diabetes coordinator consult pending.    Hyperlipemia: Lipid panel cholesterol 239, triglycerides 153 LDL 162.    HTN (hypertension): On soft side this am. Will hold lisinopril for now. Monitor closely.   Obesity: BMI 42.5. Nutritional consult  Elevated alk phos: likely related to #1. Will recheck in am  Hypokalemia: mild. Will replete    Code Status: full Family Communication: none at bedside Disposition Plan: transfer to med/surg. Hopefully discharge tomorrow   Consultants:  none  Procedures:  none  Antibiotics:  none  HPI/Subjective: Up in chair. Denies pain/discomfort. No nausea. Requesting food  Objective: Filed Vitals:   09/15/13 0751  BP:   Pulse:   Temp: 98.3 F (36.8 C)  Resp:     Intake/Output Summary (Last 24 hours) at 09/15/13 0905 Last data  filed at 09/15/13 0300  Gross per 24 hour  Intake    480 ml  Output    625 ml  Net   -145 ml   Filed Weights   09/14/13 0724 09/14/13 1502 09/15/13 0500  Weight: 125.646 kg (277 lb) 126.6 kg (279 lb 1.6 oz) 126.5 kg (278 lb 14.1 oz)    Exam:   General:  Obese appears comfortable  Cardiovascular: RRR No MGR no LE edema  Respiratory: normal effort BS clear bilaterally no wheeze  Abdomen: obese soft +BS non-tender to palpation  Musculoskeletal: no clubbing or cyanosis   Data Reviewed: Basic Metabolic Panel:  Recent Labs Lab 09/14/13 1508 09/14/13 1629 09/14/13 1908 09/14/13 2111 09/15/13 0421  NA 147 149* 141 144 145  K 4.1 4.1 4.4 3.7 3.6*  CL 107 108 106 106 108  CO2 _0 GLUCOSE 326* 244* 224* 151* 188*  BUN _1 CREATININE 0.93 0.92 0.81 0.79 0.82  CALCIUM 9.5 9.6 9.1 9.2 8.8   Liver Function Tests:  Recent Labs Lab 09/14/13 0905  AST 29  ALT 39*  ALKPHOS 238*  BILITOT 0.5  PROT 9.2*  ALBUMIN 4.3   No results found for this basename: LIPASE, AMYLASE,  in the last 168 hours No results found for this basename: AMMONIA,  in the last 168 hours CBC:  Recent Labs Lab 09/14/13 0752  WBC 10.0  NEUTROABS 6.6  HGB 14.1  HCT 41.8  MCV 85.5  PLT PLATELET CLUMPS NOTED ON SMEAR, COUNT APPEARS ADEQUATE   Cardiac Enzymes: No results found for this basename: CKTOTAL, CKMB, CKMBINDEX, TROPONINI,  in the last 168 hours BNP (last 3 results)  No results found for this basename: PROBNP,  in the last 8760 hours CBG:  Recent Labs Lab 09/15/13 0432 09/15/13 0538 09/15/13 0641 09/15/13 0743 09/15/13 0849  GLUCAP 159* 174* 161* 190* 242*    Recent Results (from the past 240 hour(s))  MRSA PCR SCREENING     Status: None   Collection Time    09/14/13  2:58 PM      Result Value Ref Range Status   MRSA by PCR NEGATIVE  NEGATIVE Final   Comment:            The GeneXpert MRSA Assay (FDA     approved for NASAL specimens     only), is  one component of a     comprehensive MRSA colonization     surveillance program. It is not     intended to diagnose MRSA     infection nor to guide or     monitor treatment for     MRSA infections.     Studies: Dg Chest Portable 1 View  09/14/2013   CLINICAL DATA:  Weakness, dizziness, and fatigue  EXAM: PORTABLE CHEST - 1 VIEW  COMPARISON:  None.  FINDINGS: The lungs are well-expanded and clear. The heart is top-normal in size. The pulmonary vascularity is not engorged. The mediastinum is normal in width. The observed bony thorax exhibits mild endplate spurring at multiple thoracic levels.  IMPRESSION: Mild hyperinflation may reflect COPD or reactive airway disease. There is no evidence of pneumonia nor CHF.   Electronically Signed   By: David  [place]   On: 09/14/2013 09:05    Scheduled Meds: . atorvastatin  80 mg Oral QPM  . enoxaparin (LOVENOX) injection  40 mg Subcutaneous Q24H  . glipiZIDE  5 mg Oral BID AC  . insulin aspart  0-9 Units Subcutaneous TID WC   Continuous Infusions:    Principal Problem:   DKA, type 2 Active Problems:   Type 2 diabetes mellitus with HbA1C goal below 7.5   Hyperlipemia   Elevated glucose   HTN (hypertension)   DKA (diabetic ketoacidoses)   Obesity   Hypokalemia   Elevated alkaline phosphatase level    Time spent: 35 minutes    [facility] Pager [phone]. If 7PM-7AM, please contact night-coverage at [email or web address], password [facility] 09/15/2013, 9:05 AM  LOS: 1 day

## 2013-09-15 NOTE — Progress Notes (Signed)
Patient being transferred to 300 without telemetry. Called report to nurse Loma Sousa

## 2013-09-15 NOTE — Plan of Care (Addendum)
Problem: Inadequate Intake (NI-2.1) Goal: Food and/or nutrient delivery Individualized approach for food/nutrient provision. Outcome: Adequate for Discharge  RD consulted for nutrition education regarding diabetes.     Lab Results  Component Value Date    HGBA1C 12.9* 09/14/2013    Pt inferred that she has been in denial about having diabetes. Pt denies previous diagnosis of diabetes. She denies taking any medications at home for glycemic control. However, chart review conflicts reported information, as she has been being followed for DM by her PCP, Dr. Wolfgang Phoenix, and home medication list reveals she takes Glucophage 1000 mg BID and Glucotrol 5 mg BID PTA. She admits that she stopped taking her blood sugars over a year ago. She also admits to diet noncompliance.Typically Hgb A1c is in the 6-7 range. When questioned as to why she refrained from self-monitoring practices she responded "I don't have an answer for why I stopped. I was just stupid".     She reports she has been feeling sick since Sunday and has been consuming high carbohydrate foods such as Gatorade, watermelon, water, and oatmeal. She reports she had been feeling weak and "everything tasted nasty". Diet recall prior to illness revealed: Breakfast: oatmeal or grits, eggs, sausage or bacon OR cereal (Honey Nut Cheerios or Raisin Bran, coffee with sweet and low and creamer; Lunch: skip OR sandwich (bologna or [place], mayo, lettuce, tomato, on white bread), potato chips; Dinner: rice, baked chicken, and mixed vegetables. Beverages consist of orange juice and pink lemonade with pineapple juice. She proudly admits that she has refrained from using soft drinks since 2008, but liked the diet gingerale that has been provided to her while in the hospital.   She also reports she has been working on weight loss. She has been attending the [facility] for water exercises 3 days a week since May or June, after she last had a hip replacement.   RD provided  "Carbohydrate Counting for People with Diabetes" handout from the Academy of Nutrition and Dietetics. Discussed different food groups and their effects on blood sugar, emphasizing carbohydrate-containing foods. Provided list of carbohydrates and recommended serving sizes of common foods.  Discussed importance of controlled and consistent carbohydrate intake throughout the day. Provided examples of ways to balance meals/snacks and encouraged intake of high-fiber, whole grain complex carbohydrates. Teach back method used.  Expect fair to good compliance.  Body mass index is 42.41 kg/(m^2). Pt meets criteria for extreme obesity, class II based on current BMI.  Current diet order is Carb Modified/ Heart Healthy, patient is consuming approximately 75% of meals at this time. Labs and medications reviewed. No further nutrition interventions warranted at this time. RD contact information provided. If additional nutrition issues arise, please re-consult RD.  [name], RD, LDN Pager: [phone]

## 2013-09-15 NOTE — Progress Notes (Signed)
Attempted to start DM educational videos; however, TV channel does not show selected video. Notified staff to correct problem.

## 2013-09-16 LAB — GLUCOSE, CAPILLARY
GLUCOSE-CAPILLARY: 248 mg/dL — AB (ref 70–99)
GLUCOSE-CAPILLARY: 258 mg/dL — AB (ref 70–99)
GLUCOSE-CAPILLARY: 263 mg/dL — AB (ref 70–99)
Glucose-Capillary: 281 mg/dL — ABNORMAL HIGH (ref 70–99)
Glucose-Capillary: 282 mg/dL — ABNORMAL HIGH (ref 70–99)
Glucose-Capillary: 339 mg/dL — ABNORMAL HIGH (ref 70–99)

## 2013-09-16 LAB — COMPREHENSIVE METABOLIC PANEL
ALBUMIN: 3 g/dL — AB (ref 3.5–5.2)
ALT: 38 U/L — ABNORMAL HIGH (ref 0–35)
AST: 44 U/L — AB (ref 0–37)
Alkaline Phosphatase: 141 U/L — ABNORMAL HIGH (ref 39–117)
Anion gap: 11 (ref 5–15)
BILIRUBIN TOTAL: 0.7 mg/dL (ref 0.3–1.2)
BUN: 10 mg/dL (ref 6–23)
CHLORIDE: 102 meq/L (ref 96–112)
CO2: 24 mEq/L (ref 19–32)
Calcium: 8.6 mg/dL (ref 8.4–10.5)
Creatinine, Ser: 0.79 mg/dL (ref 0.50–1.10)
GFR calc Af Amer: >90 mL/min (ref >90)
GFR calc non Af Amer: 87 mL/min — ABNORMAL LOW (ref >90)
Glucose, Bld: 300 mg/dL — ABNORMAL HIGH (ref 70–99)
Potassium: 4.1 mEq/L (ref 3.7–5.3)
Sodium: 137 mEq/L (ref 137–147)
Total Protein: 6.5 g/dL (ref 6.0–8.3)

## 2013-09-16 MED ORDER — INSULIN DETEMIR 100 UNIT/ML ~~LOC~~ SOLN
30.0000 [IU] | Freq: Every day | SUBCUTANEOUS | Status: DC
  Administered 2013-09-17: 30 [IU] via SUBCUTANEOUS
  Filled 2013-09-16 (×3): qty 0.3

## 2013-09-16 MED ORDER — INSULIN DETEMIR 100 UNIT/ML ~~LOC~~ SOLN
10.0000 [IU] | Freq: Once | SUBCUTANEOUS | Status: AC
  Administered 2013-09-16: 10 [IU] via SUBCUTANEOUS
  Filled 2013-09-16: qty 0.1

## 2013-09-16 MED ORDER — INSULIN ASPART 100 UNIT/ML ~~LOC~~ SOLN
0.0000 [IU] | Freq: Every day | SUBCUTANEOUS | Status: DC
  Administered 2013-09-16: 4 [IU] via SUBCUTANEOUS

## 2013-09-16 MED ORDER — INSULIN ASPART 100 UNIT/ML ~~LOC~~ SOLN
0.0000 [IU] | Freq: Three times a day (TID) | SUBCUTANEOUS | Status: DC
  Administered 2013-09-16 (×2): 11 [IU] via SUBCUTANEOUS
  Administered 2013-09-16: 7 [IU] via SUBCUTANEOUS
  Administered 2013-09-17: 11 [IU] via SUBCUTANEOUS
  Administered 2013-09-17: 15 [IU] via SUBCUTANEOUS
  Administered 2013-09-17: 11 [IU] via SUBCUTANEOUS
  Administered 2013-09-18: 7 [IU] via SUBCUTANEOUS

## 2013-09-16 NOTE — Progress Notes (Addendum)
Inpatient Diabetes Program Recommendations  AACE/ADA: New Consensus Statement on Inpatient Glycemic Control (2013)  Target Ranges:  Prepandial:   less than 140 mg/dL      Peak postprandial:   less than 180 mg/dL (1-2 hours)      Critically ill patients:  140 - 180 mg/dL   Results for CERI, MAYER (MRN [id number]) as of 09/16/2013 06:51  Ref. Range 09/15/2013 08:49 09/15/2013 11:28 09/15/2013 16:27 09/15/2013 21:38 09/16/2013 00:28  Glucose-Capillary Latest Range: 70-99 mg/dL 242 (H) 302 (H) 276 (H) 264 (H) 258 (H)   Diabetes history: DM2 Outpatient Diabetes medications: Glipizide 5 mg BID, Metformin 1000 mg BID Current orders for Inpatient glycemic control: Levemir 20 units QHS, Novolog 0-15 units AC, Novolog 0-5 units HS  Inpatient Diabetes Program Recommendations Insulin - Basal: Please increase Levemir to 30 units daily.  Please order one time dose for Levemir 10 units for now since patient has already gotten Levemir 20 units at 10am today (for total of Levemir 30 units for today). Correction (SSI): Please increase Novolog correction to Resistant scale ACHS. Oral DM medications: If patient will continue on Metformin at discharge, may want to consider ordering now as an inpatient.  09/16/13@12 :62- Talked with patient regarding insulin. Patient has been giving herself insulin injections here in the hospital and she states that she feels comfortable with giving herself the injections. Educated patient on insulin pen use at home. Reviewed contents of insulin flexpen starter kit. Reviewed all steps of insulin pen including attachment of needle, 2-unit air shot, dialing up dose, giving injection, removing needle, disposal of sharps, storage of unused insulin, disposal of insulin etc. Patient able to provide successful return demonstration. Also reviewed troubleshooting with insulin pen. MD to give patient Rxs for insulin pens and insulin pen needles at time of discharge.   In talking with the patient she  reported that her daughter had brought all of her medications from home. Reviewed all of the pills she had in the bag her daughter brought her. Patient did not have any bottles for oral diabetic medications. Inquired about Metformin and Glipizide and patient states that she must have ran out of them since she did not have the bottle for them. She states that the last time she went to see Dr. Wolfgang Phoenix in June she told him she needed refills for the medications but she states "I guess he forgot to call them in, so I am out of them."  Called Dr. Lance Sell office to verify oral DM medications and was told that patient was prescribed Metformin 1000 mg BID and Glipizide 5 mg BID. Since patient does not have any Metformin or Glipizide, if they are continued at discharge she will need prescriptions for them also. Patient expressed concern about being able to get her medications right away and states that she has to mail order all her medicines.  I encouraged her to call [employer] and discuss with them because she will need to get insulin or oral DM medications right away. Patient verbalized understanding of information discussed.    Thanks, Barnie Alderman, RN, MSN, CCRN Diabetes Coordinator Inpatient Diabetes Program [phone] (Team Pager) [phone] ([place] office) [phone] [facility] office)

## 2013-09-16 NOTE — Progress Notes (Signed)
  PROGRESS NOTE  [name] BHA:[id number] DOB: [birth year] DOA: 09/14/2013 PCP: Sallee Lange, MD  Summary: 63 year old woman with history of diabetes mellitus type 2 who has been off her medications for several months and noncompliant with her diet presented with severe hyperglycemia, DKA.  Assessment/Plan: 1. DKA secondary to noncompliance. Resolved. 2. Diabetes mellitus type 2, uncontrolled by hemoglobin A1c. Blood sugars remained elevated. Adjust insulin.   Discussed with diabetes RN. We will make adjustments to her long-acting insulin, plan discharge once blood sugars are better controlled, likely 9/5 on insulin and oral agents.  Code Status: full code DVT prophylaxis: Lovenox Family Communication:  Disposition Plan: home  Murray Hodgkins, MD  [facility]  Pager [phone] If 7PM-7AM, please contact night-coverage at [email or web address], password [facility] 09/16/2013, 4:49 PM  LOS: 2 days   Consultants:    Procedures:    Antibiotics:    HPI/Subjective: She feels well today. No complaints.  Objective: Filed Vitals:   09/15/13 1414 09/15/13 2139 09/16/13 0649 09/16/13 1455  BP: 111/62 106/45 80/54 123/86  Pulse: 72 75 70 78  Temp: 98 F (36.7 C) 98.7 F (37.1 C) 98.7 F (37.1 C) 98.7 F (37.1 C)  TempSrc: Oral Oral Oral Oral  Resp: 20 20 20 20   Height:      Weight:      SpO2: 97% 96% 96% 97%    Intake/Output Summary (Last 24 hours) at 09/16/13 1649 Last data filed at 09/16/13 0956  Gross per 24 hour  Intake    480 ml  Output      0 ml  Net    480 ml     Filed Weights   09/14/13 0724 09/14/13 1502 09/15/13 0500  Weight: 125.646 kg (277 lb) 126.6 kg (279 lb 1.6 oz) 126.5 kg (278 lb 14.1 oz)    Exam:     Afebrile, vital signs stable. No hypoxia. Gen. Appears comfortable, calm. Sitting in chair.  Psych. Speech fluent and appropriate.  Cardiovascular. Regular rate and rhythm. No murmur, rub or gallop.   Respiratory. Clear to auscultation  bilaterally. No wheezes, rales or rhonchi. Normal respiratory effort.  Data Reviewed:  Blood sugars remain in the 200s. Basic metabolic panel unremarkable.  Scheduled Meds: . atorvastatin  80 mg Oral QPM  . enoxaparin (LOVENOX) injection  40 mg Subcutaneous Q24H  . glipiZIDE  5 mg Oral BID AC  . insulin aspart  0-20 Units Subcutaneous TID WC  . insulin aspart  0-5 Units Subcutaneous QHS  . insulin detemir  20 Units Subcutaneous Daily   Continuous Infusions:   Principal Problem:   DKA, type 2 Active Problems:   Type 2 diabetes mellitus with HbA1C goal below 7.5   Hyperlipemia   Elevated glucose   HTN (hypertension)   DKA (diabetic ketoacidoses)   Obesity   Hypokalemia   Elevated alkaline phosphatase level   DM type 2 (diabetes mellitus, type 2)   Time spent 15 minutes

## 2013-09-17 LAB — GLUCOSE, CAPILLARY
GLUCOSE-CAPILLARY: 321 mg/dL — AB (ref 70–99)
GLUCOSE-CAPILLARY: 257 mg/dL — AB (ref 70–99)
GLUCOSE-CAPILLARY: 191 mg/dL — AB (ref 70–99)
Glucose-Capillary: 265 mg/dL — ABNORMAL HIGH (ref 70–99)

## 2013-09-17 MED ORDER — LISINOPRIL 5 MG PO TABS
2.5000 mg | ORAL_TABLET | Freq: Every day | ORAL | Status: DC
  Administered 2013-09-17 – 2013-09-18 (×2): 2.5 mg via ORAL
  Filled 2013-09-17 (×2): qty 1

## 2013-09-17 MED ORDER — INSULIN DETEMIR 100 UNIT/ML ~~LOC~~ SOLN
10.0000 [IU] | Freq: Once | SUBCUTANEOUS | Status: AC
  Administered 2013-09-17: 10 [IU] via SUBCUTANEOUS
  Filled 2013-09-17: qty 0.1

## 2013-09-17 MED ORDER — ASPIRIN 325 MG PO TABS
325.0000 mg | ORAL_TABLET | Freq: Every day | ORAL | Status: DC
  Administered 2013-09-17 – 2013-09-18 (×2): 325 mg via ORAL
  Filled 2013-09-17 (×2): qty 1

## 2013-09-17 MED ORDER — INSULIN DETEMIR 100 UNIT/ML ~~LOC~~ SOLN
40.0000 [IU] | Freq: Every day | SUBCUTANEOUS | Status: DC
  Administered 2013-09-18: 40 [IU] via SUBCUTANEOUS
  Filled 2013-09-17 (×2): qty 0.4

## 2013-09-17 NOTE — Progress Notes (Signed)
  PROGRESS NOTE  Rosealyn SALLY-ANNE WAMBLE SHF:[id number] DOB: December 09, 1950 DOA: 09/14/2013 PCP: Sallee Lange, MD  Summary: 63 year old woman with history of diabetes mellitus type 2 who has been off her medications for several months and noncompliant with her diet presented with severe hyperglycemia, DKA.  Assessment/Plan: 1. DKA secondary to noncompliance. Resolved. 2. Diabetes mellitus type 2, uncontrolled by hemoglobin A1c. continues to remain poorly controlled.   Overall clinically appears well but blood sugars continue to remain consistently elevated despite increased insulin.  Plan to increase Levemir further today and monitor CBG. Likely home in AM.  Code Status: full code DVT prophylaxis: Lovenox Family Communication:  Disposition Plan: home  Murray Hodgkins, MD  [facility]  Pager [phone] If 7PM-7AM, please contact night-coverage at [email or web address], password [facility] 09/17/2013, 4:29 PM  LOS: 3 days   Consultants:    Procedures:    Antibiotics:    HPI/Subjective: Continues to feel well.  Objective: Filed Vitals:   09/16/13 1455 09/16/13 2100 09/17/13 0607 09/17/13 1503  BP: 123/86 134/56 112/57 118/70  Pulse: 78 78 71 80  Temp: 98.7 F (37.1 C) 98.2 F (36.8 C) 98 F (36.7 C) 98.5 F (36.9 C)  TempSrc: Oral Oral Oral Oral  Resp: 20 20 20 20   Height:      Weight:      SpO2: 97% 98% 100% 100%    Intake/Output Summary (Last 24 hours) at 09/17/13 1629 Last data filed at 09/17/13 0900  Gross per 24 hour  Intake    240 ml  Output      0 ml  Net    240 ml     Filed Weights   09/14/13 0724 09/14/13 1502 09/15/13 0500  Weight: 125.646 kg (277 lb) 126.6 kg (279 lb 1.6 oz) 126.5 kg (278 lb 14.1 oz)    Exam:     Afebrile, vital signs stable. No hypoxia.  Respiratory clear to auscultation bilaterally no wheezes, rales or rhonchi. Normal respiratory effort.  Appears calm, comfortable. Sitting in chair.  Cardiovascular regular rate and rhythm. No murmur,  rub or gallop.  Psychiatric grossly normal mood and affect. Speech fluent and appropriate.  Data Reviewed:  Blood sugars remain 200-300s  Scheduled Meds: . atorvastatin  80 mg Oral QPM  . enoxaparin (LOVENOX) injection  40 mg Subcutaneous Q24H  . glipiZIDE  5 mg Oral BID AC  . insulin aspart  0-20 Units Subcutaneous TID WC  . insulin aspart  0-5 Units Subcutaneous QHS  . [START ON 09/18/2013] insulin detemir  40 Units Subcutaneous Daily   Continuous Infusions:   Principal Problem:   DKA, type 2 Active Problems:   Type 2 diabetes mellitus with HbA1C goal below 7.5   Hyperlipemia   Elevated glucose   HTN (hypertension)   DKA (diabetic ketoacidoses)   Obesity   Hypokalemia   Elevated alkaline phosphatase level   DM type 2 (diabetes mellitus, type 2)   Time spent 15 minutes

## 2013-09-18 LAB — GLUCOSE, CAPILLARY: Glucose-Capillary: 208 mg/dL — ABNORMAL HIGH (ref 70–99)

## 2013-09-18 MED ORDER — METFORMIN HCL 500 MG PO TABS: 500.0000 mg | ORAL_TABLET | Freq: Two times a day (BID) | ORAL | Status: DC

## 2013-09-18 MED ORDER — GLIPIZIDE 5 MG PO TABS: 5.0000 mg | ORAL_TABLET | Freq: Two times a day (BID) | ORAL | Status: DC

## 2013-09-18 MED ORDER — INSULIN DETEMIR 100 UNIT/ML FLEXPEN
40.0000 [IU] | PEN_INJECTOR | Freq: Every day | SUBCUTANEOUS | Status: DC
Start: 2013-09-18 — End: 2013-10-18

## 2013-09-18 NOTE — Progress Notes (Signed)
Patient with orders to be discharge home. Discharge instructions given, patient verbalized understanding. Prescriptions given. Patient stable. Patient left in private vehicle.  

## 2013-09-18 NOTE — Progress Notes (Signed)
  PROGRESS NOTE  [name] DGL:[id number] DOB: 07-17-1950 DOA: 09/14/2013 PCP: Sallee Lange, MD  Summary: 63 year old woman with history of diabetes mellitus type 2 who has been off her medications for several months and noncompliant with her diet presented with severe hyperglycemia, DKA.  Assessment/Plan: 1. DKA secondary to noncompliance. Resolved. 2. Diabetes mellitus type 2, uncontrolled by hemoglobin A1c. continues to remain poorly controlled.   Blood sugar control overall much better. Stable for discharge home today on glipizide, metformin and Levemir. Patient is highly motivated to maintain blood sugar control, she has been keeping a logbook here in the hospital and has been educated on how to use her insulin. She has also received diet education. She has a followup appointment with her primary care physician in less than one week.  Levemir pens, insulin pen needles, and oral DM medications (Glipizide and Metformin) if they are continued at discharge. Thanks  Murray Hodgkins, MD  [facility]  Pager [phone] If 7PM-7AM, please contact night-coverage at [email or web address], password [facility] 09/18/2013, 8:12 AM  LOS: 4 days   Consultants:    Procedures:    Antibiotics:    HPI/Subjective: She continues to feel well. No complaints.  Objective: Filed Vitals:   09/17/13 0607 09/17/13 1503 09/17/13 2149 09/18/13 0647  BP: 112/57 118/70 109/46 108/57  Pulse: 71 80 65 64  Temp: 98 F (36.7 C) 98.5 F (36.9 C) 98.2 F (36.8 C) 98.4 F (36.9 C)  TempSrc: Oral Oral Oral Oral  Resp: 20 20 20 13   Height:      Weight:      SpO2: 100% 100% 100% 98%    Intake/Output Summary (Last 24 hours) at 09/18/13 0812 Last data filed at 09/17/13 1816  Gross per 24 hour  Intake    480 ml  Output      0 ml  Net    480 ml     Filed Weights   09/14/13 0724 09/14/13 1502 09/15/13 0500  Weight: 125.646 kg (277 lb) 126.6 kg (279 lb 1.6 oz) 126.5 kg (278 lb 14.1 oz)    Exam:       Afebrile, vital signs are stable. No hypoxia.  Appears calm, comfortable.  Speech fluent, clear.  Cardiovascular regular rate and rhythm. No murmur, rub or gallop.  Respiratory clear to auscultation bilaterally. No wheezes, rales or rhonchi. Normal respiratory effort.  Data Reviewed:  Capillary blood sugars have decreased, 100-200s.  Scheduled Meds: . aspirin  325 mg Oral Daily  . atorvastatin  80 mg Oral QPM  . enoxaparin (LOVENOX) injection  40 mg Subcutaneous Q24H  . glipiZIDE  5 mg Oral BID AC  . insulin aspart  0-20 Units Subcutaneous TID WC  . insulin aspart  0-5 Units Subcutaneous QHS  . insulin detemir  40 Units Subcutaneous Daily  . lisinopril  2.5 mg Oral Daily   Continuous Infusions:   Principal Problem:   DKA, type 2 Active Problems:   Type 2 diabetes mellitus with HbA1C goal below 7.5   Hyperlipemia   Elevated glucose   HTN (hypertension)   DKA (diabetic ketoacidoses)   Obesity   Hypokalemia   Elevated alkaline phosphatase level   DM type 2 (diabetes mellitus, type 2)

## 2013-09-18 NOTE — Discharge Summary (Signed)
Physician Discharge Summary  [name] KTG:[id number] DOB: 1950/04/15 DOA: 09/14/2013  PCP: Jocelyn Lange, MD  Admit date: 09/14/2013 Discharge date: 09/18/2013  Recommendations for Outpatient Follow-up:  1. Diabetes mellitus type 2 uncontrolled by hemoglobin A1c. Insulin initiated this hospitalization.  2. Consider outpatient diabetes education  3. Mild elevation of transaminases may be secondary to statin. Followup as clinically indicated.  4. Home health RN for disease management, education   Follow-up Information   Follow up with Jocelyn Lange, MD On 09/20/2013. (appointment at 3pm for follow up)    Specialty:  Family Medicine   Contact information:   [address] [place] [address] [phone]       Follow up with [facility].   Contact information:   [address] [place] [address] [phone]       Follow up with Jocelyn Lange, MD. (keep scheduled appointment)    Specialty:  Family Medicine   Contact information:   [address] [place] [address] [phone]      Discharge Diagnoses:  1. DKA secondary to noncompliance with medications 2. Diabetes mellitus type 2, uncontrolled by hemoglobin A1c  Discharge Condition: Improved Disposition: Home  Diet recommendation: Diabetic diet  Filed Weights   09/14/13 0724 09/14/13 1502 09/15/13 0500  Weight: 125.646 kg (277 lb) 126.6 kg (279 lb 1.6 oz) 126.5 kg (278 lb 14.1 oz)    History of present illness:  63 year old woman with history of diabetes mellitus type 2 who has been off her medications for several months and noncompliant with her diet presented with severe hyperglycemia, DKA.  Hospital Course:  [name] was admitted to the step down unit and treated with an insulin infusion. DKA rapidly resolved. Etiology secondary to noncompliance with medications. She received diabetic education, insulin teaching and will be discharged home on insulin as well as  resuming glipizide and metformin which she has not been taking for several months. She demonstrated competence in administering insulin and has a good understanding of care recommended. She has close outpatient followup with her primary care physician in less than one week.   Discharge Instructions  Discharge Instructions   Activity as tolerated - No restrictions    Complete by:  As directed      Diet Carb Modified    Complete by:  As directed      Discharge instructions    Complete by:  As directed   Call your physician or seek immediate medical attention for blood sugar greater than 400, less than 70, or worsening of condition. Checks your blood sugars 3 times per day and keep a logbook.          Current Discharge Medication List    START taking these medications   Details  Insulin Detemir (LEVEMIR) 100 UNIT/ML Pen Inject 40 Units into the skin daily after breakfast. Qty: 15 mL, Refills: 1      CONTINUE these medications which have CHANGED   Details  glipiZIDE (GLUCOTROL) 5 MG tablet Take 1 tablet (5 mg total) by mouth 2 (two) times daily before a meal. Qty: 60 tablet, Refills: 0    metFORMIN (GLUCOPHAGE) 500 MG tablet Take 1 tablet (500 mg total) by mouth 2 (two) times daily with a meal. Qty: 60 tablet, Refills: 0      CONTINUE these medications which have NOT CHANGED   Details  aspirin 325 MG tablet Take 325 mg by mouth daily.    atorvastatin (LIPITOR) 80 MG tablet Take 1  tablet (80 mg total) by mouth daily. Qty: 90 tablet, Refills: 1    Calcium Carb-Cholecalciferol 600-400 MG-UNIT TABS Take 2 tablets by mouth daily.      Flaxseed, Linseed, (FLAX SEED OIL PO) Take 3 capsules by mouth daily.     lisinopril (PRINIVIL,ZESTRIL) 5 MG tablet Take 2.5 mg by mouth daily.    Multiple Vitamins-Minerals (MULTIVITAMIN WITH MINERALS) tablet Take 1 tablet by mouth daily.       Allergies  Allergen Reactions  . Hydrocodone Itching    The results of significant diagnostics  from this hospitalization (including imaging, microbiology, ancillary and laboratory) are listed below for reference.    Significant Diagnostic Studies: Dg Chest Portable 1 View  09/14/2013   CLINICAL DATA:  Weakness, dizziness, and fatigue  EXAM: PORTABLE CHEST - 1 VIEW  COMPARISON:  None.  FINDINGS: The lungs are well-expanded and clear. The heart is top-normal in size. The pulmonary vascularity is not engorged. The mediastinum is normal in width. The observed bony thorax exhibits mild endplate spurring at multiple thoracic levels.  IMPRESSION: Mild hyperinflation may reflect COPD or reactive airway disease. There is no evidence of pneumonia nor CHF.   Electronically Signed   By: David  Martinique   On: 09/14/2013 09:05    Microbiology: Recent Results (from the past 240 hour(s))  MRSA PCR SCREENING     Status: None   Collection Time    09/14/13  2:58 PM      Result Value Ref Range Status   MRSA by PCR NEGATIVE  NEGATIVE Final   Comment:            The GeneXpert MRSA Assay (FDA     approved for NASAL specimens     only), is one component of a     comprehensive MRSA colonization     surveillance program. It is not     intended to diagnose MRSA     infection nor to guide or     monitor treatment for     MRSA infections.     Labs: Basic Metabolic Panel:  Recent Labs Lab 09/14/13 1629 09/14/13 1908 09/14/13 2111 09/15/13 0421 09/16/13 0532  NA 149* 141 144 145 137  K 4.1 4.4 3.7 3.6* 4.1  CL 108 106 106 108 102  CO2 28 21 25 27 24   GLUCOSE 244* 224* 151* 188* 300*  BUN 13 11 10 10 10   CREATININE 0.92 0.81 0.79 0.82 0.79  CALCIUM 9.6 9.1 9.2 8.8 8.6   Liver Function Tests:  Recent Labs Lab 09/14/13 0905 09/16/13 0532  AST 29 44*  ALT 39* 38*  ALKPHOS 238* 141*  BILITOT 0.5 0.7  PROT 9.2* 6.5  ALBUMIN 4.3 3.0*   CBC:  Recent Labs Lab 09/14/13 0752  WBC 10.0  NEUTROABS 6.6  HGB 14.1  HCT 41.8  MCV 85.5  PLT PLATELET CLUMPS NOTED ON SMEAR, COUNT APPEARS  ADEQUATE   CBG:  Recent Labs Lab 09/17/13 0734 09/17/13 1207 09/17/13 1653 09/17/13 2139 09/18/13 0729  GLUCAP 321* 265* 257* 191* 208*    Principal Problem:   DKA, type 2 Active Problems:   Type 2 diabetes mellitus with HbA1C goal below 7.5   Hyperlipemia   Elevated glucose   HTN (hypertension)   DKA (diabetic ketoacidoses)   Obesity   Hypokalemia   Elevated alkaline phosphatase level   DM type 2 (diabetes mellitus, type 2)   Time coordinating discharge: 25 minutes  Signed:  Murray Hodgkins, MD [facility] 09/18/2013, 8:58  AM

## 2013-09-20 ENCOUNTER — Encounter: Payer: Self-pay | Admitting: Family Medicine

## 2013-09-20 ENCOUNTER — Ambulatory Visit (INDEPENDENT_AMBULATORY_CARE_PROVIDER_SITE_OTHER): Payer: Commercial Managed Care - HMO | Admitting: Family Medicine

## 2013-09-20 VITALS — BP 138/86 | Ht 67.0 in | Wt 300.0 lb

## 2013-09-20 DIAGNOSIS — E118 Type 2 diabetes mellitus with unspecified complications: Secondary | ICD-10-CM

## 2013-09-20 DIAGNOSIS — E119 Type 2 diabetes mellitus without complications: Secondary | ICD-10-CM

## 2013-09-20 NOTE — Progress Notes (Signed)
   Subjective:    Patient ID: [name], female    DOB: 11/19/1950, 63 y.o.   MRN: [id number]  HPI Patient arrives for a follow up from recent hospitalization for diabetes. Patient states her sugar was 850 and her A1c >13.  The hospital started patient on insulin. She relates compliance with her medications. She states she is now taking her medicines and using her insulin she showed me her meter we will go over all of the reading.  Review of Systems    she denies excessive thirst urination nausea vomiting Objective:   Physical Exam  Lungs clear heart regular pulse normal extremities no edema      Assessment & Plan:  Diabetes-this patient has a better understanding of the diabetes. I told her her goals. Morning sugars less than 1:30 Pre-meal less than 150 Pre-bedtime less than 160 If numbers are consistently above this and let us know and we would recommend increasing the insulin.  Recommended dietary measures and proper diet/diabetic teaching. Referral to the hospital for that. Patient in followup in one month bring her readings with her

## 2013-09-20 NOTE — Patient Instructions (Signed)
Diabetes Mellitus and Food It is important for you to manage your blood sugar (glucose) level. Your blood glucose level can be greatly affected by what you eat. Eating healthier foods in the appropriate amounts throughout the day at about the same time each day will help you control your blood glucose level. It can also help slow or prevent worsening of your diabetes mellitus. Healthy eating may even help you improve the level of your blood pressure and reach or maintain a healthy weight.  HOW CAN FOOD AFFECT ME? Carbohydrates Carbohydrates affect your blood glucose level more than any other type of food. Your dietitian will help you determine how many carbohydrates to eat at each meal and teach you how to count carbohydrates. Counting carbohydrates is important to keep your blood glucose at a healthy level, especially if you are using insulin or taking certain medicines for diabetes mellitus. Alcohol Alcohol can cause sudden decreases in blood glucose (hypoglycemia), especially if you use insulin or take certain medicines for diabetes mellitus. Hypoglycemia can be a life-threatening condition. Symptoms of hypoglycemia (sleepiness, dizziness, and disorientation) are similar to symptoms of having too much alcohol.  If your health care provider has given you approval to drink alcohol, do so in moderation and use the following guidelines:  Women should not have more than one drink per day, and men should not have more than two drinks per day. One drink is equal to:  12 oz of beer.  5 oz of wine.  1 oz of hard liquor.  Do not drink on an empty stomach.  Keep yourself hydrated. Have water, diet soda, or unsweetened iced tea.  Regular soda, juice, and other mixers might contain a lot of carbohydrates and should be counted. WHAT FOODS ARE NOT RECOMMENDED? As you make food choices, it is important to remember that all foods are not the same. Some foods have fewer nutrients per serving than other  foods, even though they might have the same number of calories or carbohydrates. It is difficult to get your body what it needs when you eat foods with fewer nutrients. Examples of foods that you should avoid that are high in calories and carbohydrates but low in nutrients include:  Trans fats (most processed foods list trans fats on the Nutrition Facts label).  Regular soda.  Juice.  Candy.  Sweets, such as cake, pie, doughnuts, and cookies.  Fried foods. WHAT FOODS CAN I EAT? Have nutrient-rich foods, which will nourish your body and keep you healthy. The food you should eat also will depend on several factors, including:  The calories you need.  The medicines you take.  Your weight.  Your blood glucose level.  Your blood pressure level.  Your cholesterol level. You also should eat a variety of foods, including:  Protein, such as meat, poultry, fish, tofu, nuts, and seeds (lean animal proteins are best).  Fruits.  Vegetables.  Dairy products, such as milk, cheese, and yogurt (low fat is best).  Breads, grains, pasta, cereal, rice, and beans.  Fats such as olive oil, trans fat-free margarine, canola oil, avocado, and olives. DOES EVERYONE WITH DIABETES MELLITUS HAVE THE SAME MEAL PLAN? Because every person with diabetes mellitus is different, there is not one meal plan that works for everyone. It is very important that you meet with a dietitian who will help you create a meal plan that is just right for you. Document Released: 09/26/2004 Document Revised: 01/04/2013 Document Reviewed: 11/26/2012 ExitCare Patient Information 2015 [employer]. This   information is not intended to replace advice given to you by your health care provider. Make sure you discuss any questions you have with your health care provider.  

## 2013-10-18 ENCOUNTER — Encounter: Payer: Self-pay | Admitting: Family Medicine

## 2013-10-18 ENCOUNTER — Ambulatory Visit (INDEPENDENT_AMBULATORY_CARE_PROVIDER_SITE_OTHER): Payer: Commercial Managed Care - HMO | Admitting: Family Medicine

## 2013-10-18 VITALS — BP 122/64 | Ht 67.5 in | Wt 289.0 lb

## 2013-10-18 DIAGNOSIS — E785 Hyperlipidemia, unspecified: Secondary | ICD-10-CM

## 2013-10-18 DIAGNOSIS — E118 Type 2 diabetes mellitus with unspecified complications: Secondary | ICD-10-CM | POA: Diagnosis not present

## 2013-10-18 DIAGNOSIS — I10 Essential (primary) hypertension: Secondary | ICD-10-CM | POA: Diagnosis not present

## 2013-10-18 MED ORDER — INSULIN DETEMIR 100 UNIT/ML FLEXPEN: 40.0000 [IU] | PEN_INJECTOR | Freq: Every day | SUBCUTANEOUS | Status: DC

## 2013-10-18 MED ORDER — GLIPIZIDE 5 MG PO TABS: 5.0000 mg | ORAL_TABLET | Freq: Two times a day (BID) | ORAL | Status: DC

## 2013-10-18 MED ORDER — METFORMIN HCL 500 MG PO TABS: 500.0000 mg | ORAL_TABLET | Freq: Two times a day (BID) | ORAL | Status: DC

## 2013-10-18 MED ORDER — ATORVASTATIN CALCIUM 80 MG PO TABS: 80.0000 mg | ORAL_TABLET | Freq: Every day | ORAL | Status: DC

## 2013-10-18 NOTE — Progress Notes (Signed)
   Subjective:    Patient ID: [name], female    DOB: 03/30/1950, 63 y.o.   MRN: [id number]  Diabetes She presents for her follow-up diabetic visit. She has type 2 diabetes mellitus. Pertinent negatives for hypoglycemia include no confusion. Pertinent negatives for diabetes include no chest pain, no fatigue, no polydipsia, no polyphagia and no weakness. Current diabetic treatment includes insulin injections and oral agent (dual therapy). She is compliant with treatment all of the time. She is following a diabetic diet. Her home blood glucose trend is fluctuating dramatically. She does not see a podiatrist.Eye exam is current.  A1C on 09/14/13 was 12.9.  Needs refill on atorvastatin 80mg , glipizide 5mg , insulin, and metformin 500 Pt brought in blood sugar readings.  I reviewed over her glucose was excellent. The significant improvement compared with a  We talked at length about diabetic diet also about compliance with medications the importance of keeping cholesterol and blood pressure under control. We also talked about how her sugar readings are significantly elevated she needs to come back sooner then to wait too long. Review of Systems  Constitutional: Negative for activity change, appetite change and fatigue.  HENT: Negative for congestion.   Respiratory: Negative for cough and shortness of breath.   Cardiovascular: Negative for chest pain.  Gastrointestinal: Negative for abdominal pain.  Endocrine: Negative for polydipsia and polyphagia.  Genitourinary: Negative for frequency.  Neurological: Negative for weakness.  Psychiatric/Behavioral: Negative for confusion.       Objective:   Physical Exam  Vitals reviewed. Constitutional: She appears well-nourished. No distress.  Cardiovascular: Normal rate, regular rhythm and normal heart sounds.   No murmur heard. Pulmonary/Chest: Effort normal and breath sounds normal. No respiratory distress.  Musculoskeletal: She exhibits no  edema.  Lymphadenopathy:    She has no cervical adenopathy.  Neurological: She is alert. She exhibits normal muscle tone.  Psychiatric: Her behavior is normal.          Assessment & Plan:  Long discussion held regarding diabetic education  They will be doing diabetic education at the hospital Patient if she starts having elevated glucose readings will notify us much sooner to avoid hospital admissions The importance of blood pressure cholesterol and A1c control was discussed in detail 25 minutes spent with patient Followup 3 months with lab work

## 2013-10-19 ENCOUNTER — Telehealth: Payer: Self-pay | Admitting: Family Medicine

## 2013-10-19 MED ORDER — INSULIN PEN NEEDLE 32G X 4 MM MISC: Status: DC

## 2013-10-19 NOTE — Telephone Encounter (Signed)
[employer] MICRO PEN NEEDLES 32G X 4 MM MISC  Pt states she needs this as well, was not ordered yesterday   [facility]

## 2013-10-19 NOTE — Telephone Encounter (Signed)
Rx sent electronically to pharmacy. Patient notified. 

## 2013-10-27 ENCOUNTER — Ambulatory Visit (INDEPENDENT_AMBULATORY_CARE_PROVIDER_SITE_OTHER): Payer: Commercial Managed Care - HMO | Admitting: Orthopedic Surgery

## 2013-10-27 ENCOUNTER — Ambulatory Visit (INDEPENDENT_AMBULATORY_CARE_PROVIDER_SITE_OTHER): Payer: Commercial Managed Care - HMO

## 2013-10-27 ENCOUNTER — Encounter: Payer: Self-pay | Admitting: Orthopedic Surgery

## 2013-10-27 VITALS — BP 118/67 | Ht 67.5 in | Wt 289.0 lb

## 2013-10-27 DIAGNOSIS — Z96652 Presence of left artificial knee joint: Secondary | ICD-10-CM

## 2013-10-27 DIAGNOSIS — M1712 Unilateral primary osteoarthritis, left knee: Secondary | ICD-10-CM

## 2013-10-27 NOTE — Progress Notes (Signed)
Follow up  Chief Complaint  Patient presents with  . 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 No instability feelings in the knee walking without support  Vital signs are stable. Her knee incision looks normal just some tenderness over the patellar tendon she can straight leg raise with no extensor lag the knee is stable and anterior posterior plane in the coronal plane. Her knee flexion is 125. She is ambulating and walking without a limp.BP 118/67  Ht 5' 7.5" (1.715 m)  Wt 289 lb (131.09 kg)  BMI 44.57 kg/m2   X-ray shows no complications related to the knee replacement.  Status post left total knee doing well  Recommend topical muscle cream/BenGay for anterior knee pain.

## 2013-10-28 ENCOUNTER — Encounter: Payer: Medicare HMO | Attending: Family Medicine | Admitting: Nutrition

## 2013-10-28 ENCOUNTER — Encounter: Payer: Self-pay | Admitting: Nutrition

## 2013-10-28 VITALS — Ht 67.0 in | Wt 293.0 lb

## 2013-10-28 DIAGNOSIS — E118 Type 2 diabetes mellitus with unspecified complications: Secondary | ICD-10-CM | POA: Insufficient documentation

## 2013-10-28 DIAGNOSIS — Z794 Long term (current) use of insulin: Secondary | ICD-10-CM | POA: Insufficient documentation

## 2013-10-28 DIAGNOSIS — E1165 Type 2 diabetes mellitus with hyperglycemia: Secondary | ICD-10-CM

## 2013-10-28 DIAGNOSIS — IMO0002 Reserved for concepts with insufficient information to code with codable children: Secondary | ICD-10-CM

## 2013-10-28 DIAGNOSIS — Z713 Dietary counseling and surveillance: Secondary | ICD-10-CM | POA: Diagnosis not present

## 2013-10-28 NOTE — Progress Notes (Signed)
  Medical Nutrition Therapy:  Appt start time: 0800 end time:  0900.   Assessment:  Primary concerns today: Uncontrolled Diabetes Type 2. . Pt lives by herself. She does her own shopping and cooking. FBS 74-126; At night 96-105 mg/dl. BS are much better since she has been taking her Levemir, Glucotrol and Metformin.  Preferred Learning Style:   No preference indicated   Learning Readiness:    Ready  Change in progress   MEDICATIONS: see list   DIETARY INTAKE:  24-hr recall:  B ( AM): Grils 1/4 c, 2 slices [place] bacon and 1-2 eggs, 2% milk.  Snk ( AM): 1 c of sf jello, or mango or gram crackers, water  L ( PM): String beans, meat-chicken strips, broccoli, water Snk ( PM): organic tea D ( PM): Chicken, brocolli with cheese and few potato wedges, water Snk ( PM):  Beverages: water,   Usual physical activity: water aerobics at [facility]  Estimated energy needs: 1600 calories 180 g carbohydrates 120 g protein 44 g fat  Progress Towards Goal(s):  In progress.   Nutritional Diagnosis:  NB-1.1 Food and nutrition-related knowledge deficit As related to Diabetes.  As evidenced by A1C >12%.    Intervention:  Nutrition counseling and diabetes education.  Plan:  Aim for 2-3 Carb Choices per meal (30-45 grams) +/- 1 either way  Aim for 0-1 Carbs per snack if hungry  Include protein in moderation with your meals  Avoid snacks if possible. Consider reading food labels for Total Carbohydrate and Fat Grams of foods Consider  increasing your activity level by 15-30  minutes daily as tolerated Consider checking BG at alternate times per day as directed by MD  Continue taking medication  as directed by MD  Goal: Lose 1 lb per week. 2. Measure foods out 3. Get A1C down to 8% in three months.  Teaching Method Utilized:  Visual Auditory Hands on  Handouts given during visit include: Carb Counting and Food Label handouts Meal Plan Card The Plate Method  Barriers to  learning/adherence to lifestyle change: none  Demonstrated degree of understanding via:  Teach Back   Monitoring/Evaluation:  Dietary intake, exercise, meal planning, and body weight in 1 month(s).  Notify MD if you have a blood sugar less than 70 two times in a row or more than 2 times per week to adjust medications.

## 2013-10-28 NOTE — Patient Instructions (Addendum)
  Plan:  Aim for 2-3 Carb Choices per meal (30-45 grams) +/- 1 either way  Aim for 0-1 Carbs per snack if hungry  Include protein in moderation with your meals and snacks Consider reading food labels for Total Carbohydrate and Fat Grams of foods Consider  increasing your activity level by 15-30  minutes daily as tolerated Consider checking BG at alternate times per day as directed by MD  Continue taking medication  as directed by MD  Goal: Lose 1 lb per week. 2. Measure foods out 3. Get A1C down to 8% in three months.  Notify MD if you have 2 low blood sugars less than 70 mg/dl in a row or more than 2 times in a week to adjust medications.

## 2013-11-28 ENCOUNTER — Ambulatory Visit: Payer: Medicare HMO | Admitting: Nutrition

## 2013-11-28 ENCOUNTER — Telehealth: Payer: Self-pay | Admitting: Family Medicine

## 2013-11-28 ENCOUNTER — Encounter: Payer: Commercial Managed Care - HMO | Attending: Family Medicine | Admitting: Nutrition

## 2013-11-28 VITALS — Ht 67.0 in | Wt 296.8 lb

## 2013-11-28 DIAGNOSIS — E118 Type 2 diabetes mellitus with unspecified complications: Secondary | ICD-10-CM | POA: Insufficient documentation

## 2013-11-28 DIAGNOSIS — Z794 Long term (current) use of insulin: Secondary | ICD-10-CM | POA: Diagnosis not present

## 2013-11-28 DIAGNOSIS — Z713 Dietary counseling and surveillance: Secondary | ICD-10-CM | POA: Diagnosis not present

## 2013-11-28 DIAGNOSIS — E1165 Type 2 diabetes mellitus with hyperglycemia: Secondary | ICD-10-CM

## 2013-11-28 DIAGNOSIS — IMO0002 Reserved for concepts with insufficient information to code with codable children: Secondary | ICD-10-CM

## 2013-11-28 NOTE — Progress Notes (Signed)
  Medical Nutrition Therapy:  Appt start time: 0800 end time:  830  Assessment:  Primary concerns today: Uncontrolled Diabetes Type 2. Has been only 4 units of Levemir at night instead of the 40 units prescribed.  FBS 73-147 mg/dl. Afternoon blood sugars 160-190's. Has gained 3 lbs. Exercises at water aerobics at [facility]. Still eating between meal snacks. Wants to focus on weight loss. Advised to contact Dr Wolfgang Phoenix with BS log and discuss insulin regimen.  Preferred Learning Style:   No preference indicated   Learning Readiness:    Ready  Change in progress   MEDICATIONS: see list   DIETARY INTAKE:  24-hr recall:  B ( AM):  Cherrios and whole milk, Orange Juice  8 oz Snk ( AM):  Sometimes eats gram cracker or saltimes or banana, water  L ( PM): skipped due to church; might have collard greens, chicken, rice, water Snk ( PM): sometimes eats crackers or fruit  D ( PM): Same as lunch Snk ( PM):  Beverages: water,   Usual physical activity: water aerobics at [facility]  Estimated energy needs: 1600 calories 180 g carbohydrates 120 g protein 44 g fat  Progress Towards Goal(s):  In progress.   Nutritional Diagnosis:  NB-1.1 Food and nutrition-related knowledge deficit As related to Diabetes.  As evidenced by A1C >12%.    Intervention:  Nutrition counseling and diabetes education.  Plan:  Aim for 2-3 Carb Choices per meal (30-45 grams) +/- 1 either way  Include protein in moderation with your meals  Avoid snacks if possible. Cut out juice with breakfast Cut out snacks between meals Walk 30 minutes daily Goal: Lose 1 lb per week. 2. Measure foods out 3. Get A1C down to 8% in three months.  Teaching Method Utilized:  Visual Auditory Hands on  Handouts given during visit include: Carb Counting and Food Label handouts Meal Plan Card The Plate Method  Barriers to learning/adherence to lifestyle change: none  Demonstrated degree of understanding via:  Teach Back    Monitoring/Evaluation:  Dietary intake, exercise, meal planning, and body weight in 1 month(s).  Notify MD if you have a blood sugar less than 70 two times in a row or more than 2 times per week to adjust medications.

## 2013-11-28 NOTE — Patient Instructions (Signed)
Plan:  Aim for 2-3 Carb Choices per meal (30-45 grams) +/- 1 either way  Include protein in moderation with your meals  Avoid snacks if possible. Cut out juice with breakfast Cut out snacks between meals Walk 30 minutes daily Goal: Lose 1 lb per week. 2. Measure foods out 3. Get A1C down to 8% in three months.

## 2013-11-28 NOTE — Telephone Encounter (Signed)
Pt dropped of blood sugar readings, please see front of paper chart

## 2013-12-04 NOTE — Telephone Encounter (Signed)
Please let the patient know that I reviewed over her glucose readings they look much improved she is to continue her good effort and compliance. In addition to this I highly recommend the patient check hemoglobin A1c, lipid, liver, metabolic 7 around the second week of January and follow-up in January. Thank you she might want to go ahead and schedule her office visit

## 2013-12-05 NOTE — Telephone Encounter (Signed)
[facility]

## 2013-12-07 NOTE — Telephone Encounter (Signed)
Patient notified and verbalized understanding. Transferred up front to schedule apt for January.

## 2013-12-15 ENCOUNTER — Telehealth: Payer: Self-pay | Admitting: Family Medicine

## 2013-12-15 NOTE — Telephone Encounter (Signed)
Script faxed to the pharmacy. Patient was notified.

## 2013-12-15 NOTE — Telephone Encounter (Signed)
Patient needs Rx for diabetic meter because she has lost hers and cannot test her BS.   [employer]

## 2013-12-26 ENCOUNTER — Ambulatory Visit: Payer: Commercial Managed Care - HMO | Admitting: Family Medicine

## 2013-12-27 LAB — HEPATIC FUNCTION PANEL
ALBUMIN: 4 g/dL (ref 3.5–5.2)
ALT: 23 U/L (ref 0–35)
AST: 23 U/L (ref 0–37)
Alkaline Phosphatase: 129 U/L — ABNORMAL HIGH (ref 39–117)
BILIRUBIN DIRECT: 0.1 mg/dL (ref 0.0–0.3)
Indirect Bilirubin: 0.3 mg/dL (ref 0.2–1.2)
Total Bilirubin: 0.4 mg/dL (ref 0.2–1.2)
Total Protein: 7.5 g/dL (ref 6.0–8.3)

## 2013-12-27 LAB — HEMOGLOBIN A1C
HEMOGLOBIN A1C: 7 % — AB (ref <5.7)
Mean Plasma Glucose: 154 mg/dL — ABNORMAL HIGH (ref <117)

## 2013-12-27 LAB — BASIC METABOLIC PANEL
BUN: 11 mg/dL (ref 6–23)
CALCIUM: 9.5 mg/dL (ref 8.4–10.5)
CO2: 26 meq/L (ref 19–32)
CREATININE: 0.84 mg/dL (ref 0.50–1.10)
Chloride: 102 mEq/L (ref 96–112)
Glucose, Bld: 124 mg/dL — ABNORMAL HIGH (ref 70–99)
Potassium: 4.4 mEq/L (ref 3.5–5.3)
SODIUM: 139 meq/L (ref 135–145)

## 2013-12-27 LAB — LIPID PANEL
Cholesterol: 192 mg/dL (ref 0–200)
HDL: 42 mg/dL (ref >39)
LDL CALC: 128 mg/dL — AB (ref 0–99)
TRIGLYCERIDES: 110 mg/dL (ref <150)
Total CHOL/HDL Ratio: 4.6 Ratio
VLDL: 22 mg/dL (ref 0–40)

## 2014-01-02 ENCOUNTER — Encounter: Payer: Medicare HMO | Attending: Family Medicine | Admitting: Nutrition

## 2014-01-02 DIAGNOSIS — Z794 Long term (current) use of insulin: Secondary | ICD-10-CM | POA: Insufficient documentation

## 2014-01-02 DIAGNOSIS — Z713 Dietary counseling and surveillance: Secondary | ICD-10-CM | POA: Insufficient documentation

## 2014-01-02 DIAGNOSIS — E118 Type 2 diabetes mellitus with unspecified complications: Secondary | ICD-10-CM | POA: Insufficient documentation

## 2014-01-24 ENCOUNTER — Ambulatory Visit (INDEPENDENT_AMBULATORY_CARE_PROVIDER_SITE_OTHER): Payer: Medicare HMO | Admitting: Family Medicine

## 2014-01-24 ENCOUNTER — Encounter: Payer: Self-pay | Admitting: Family Medicine

## 2014-01-24 VITALS — BP 122/74 | Ht 67.0 in | Wt 306.0 lb

## 2014-01-24 DIAGNOSIS — E119 Type 2 diabetes mellitus without complications: Secondary | ICD-10-CM

## 2014-01-24 DIAGNOSIS — I10 Essential (primary) hypertension: Secondary | ICD-10-CM

## 2014-01-24 DIAGNOSIS — E785 Hyperlipidemia, unspecified: Secondary | ICD-10-CM

## 2014-01-24 NOTE — Progress Notes (Signed)
   Subjective:    Patient ID: [name], female    DOB: 04-Jul-1950, 64 y.o.   MRN: [id number]  Diabetes She presents for her follow-up diabetic visit. She has type 2 diabetes mellitus. There are no hypoglycemic associated symptoms. Pertinent negatives for hypoglycemia include no confusion. Pertinent negatives for diabetes include no fatigue, no polydipsia, no polyphagia and no weakness. There are no hypoglycemic complications. Current diabetic treatment includes insulin injections and oral agent (dual therapy). She is compliant with treatment all of the time. She monitors blood glucose at home 1-2 x per day. Her highest blood glucose is 140-180 mg/dl. She does not see a podiatrist.Eye exam current: Has an upcoming appointment.   Long discussion held regarding importance of diabetes control the importance of watching diet exercise as well as keeping sugars blood pressure and cholesterol under good control.   Review of Systems  Constitutional: Negative for activity change, appetite change and fatigue.  Endocrine: Negative for polydipsia and polyphagia.  Genitourinary: Negative for frequency.  Neurological: Negative for weakness.  Psychiatric/Behavioral: Negative for confusion.       Objective:   Physical Exam  Constitutional: She appears well-nourished. No distress.  Cardiovascular: Normal rate, regular rhythm and normal heart sounds.   No murmur heard. Pulmonary/Chest: Effort normal and breath sounds normal. No respiratory distress.  Musculoskeletal: She exhibits no edema.  Lymphadenopathy:    She has no cervical adenopathy.  Neurological: She is alert. She exhibits normal muscle tone.  Psychiatric: Her behavior is normal.  Vitals reviewed.         Assessment & Plan:  Diabetes-actually doing better. Watch diet closely recheck A1c in 3 months  Hyperlipidemia continue medication watch diet I advised this patient try to get this under better control. If not under better  control by the next time we'll check blood work we will need to change her to Crestor  Patient was encouraged to get eye exam coming up  HTN good control  Follow-up 3 months 25 minutes spent with patient

## 2014-02-17 ENCOUNTER — Other Ambulatory Visit: Payer: Self-pay | Admitting: Family Medicine

## 2014-04-19 ENCOUNTER — Other Ambulatory Visit: Payer: Self-pay | Admitting: Family Medicine

## 2014-04-28 LAB — HM DIABETES EYE EXAM

## 2014-05-04 ENCOUNTER — Encounter: Payer: Self-pay | Admitting: *Deleted

## 2014-05-25 ENCOUNTER — Encounter: Payer: Self-pay | Admitting: Family Medicine

## 2014-05-25 ENCOUNTER — Ambulatory Visit (INDEPENDENT_AMBULATORY_CARE_PROVIDER_SITE_OTHER): Payer: Medicare HMO | Admitting: Family Medicine

## 2014-05-25 VITALS — BP 118/74 | Ht 67.0 in | Wt 305.0 lb

## 2014-05-25 DIAGNOSIS — E0842 Diabetes mellitus due to underlying condition with diabetic polyneuropathy: Secondary | ICD-10-CM

## 2014-05-25 DIAGNOSIS — E119 Type 2 diabetes mellitus without complications: Secondary | ICD-10-CM | POA: Diagnosis not present

## 2014-05-25 DIAGNOSIS — I10 Essential (primary) hypertension: Secondary | ICD-10-CM | POA: Diagnosis not present

## 2014-05-25 DIAGNOSIS — G5601 Carpal tunnel syndrome, right upper limb: Secondary | ICD-10-CM | POA: Diagnosis not present

## 2014-05-25 DIAGNOSIS — N644 Mastodynia: Secondary | ICD-10-CM | POA: Insufficient documentation

## 2014-05-25 DIAGNOSIS — E785 Hyperlipidemia, unspecified: Secondary | ICD-10-CM | POA: Diagnosis not present

## 2014-05-25 LAB — POCT GLYCOSYLATED HEMOGLOBIN (HGB A1C): Hemoglobin A1C: 6.8

## 2014-05-25 MED ORDER — GLIPIZIDE 5 MG PO TABS: ORAL_TABLET | ORAL | Status: DC

## 2014-05-25 MED ORDER — METFORMIN HCL 500 MG PO TABS: 500.0000 mg | ORAL_TABLET | Freq: Two times a day (BID) | ORAL | Status: DC

## 2014-05-25 MED ORDER — ATORVASTATIN CALCIUM 80 MG PO TABS: 80.0000 mg | ORAL_TABLET | Freq: Every day | ORAL | Status: DC

## 2014-05-25 NOTE — Progress Notes (Signed)
   Subjective:    Patient ID: [name], female    DOB: 07/15/1950, 64 y.o.   MRN: [id number]  Diabetes She presents for her follow-up diabetic visit. She has type 2 diabetes mellitus. Pertinent negatives for hypoglycemia include no confusion. Pertinent negatives for diabetes include no chest pain, no fatigue, no polydipsia, no polyphagia and no weakness. Current diabetic treatment includes insulin injections and oral agent (monotherapy). She is compliant with treatment all of the time. Diabetic current diet: eats what she wants to eat. Exercise: some exercise. Her home blood glucose trend is fluctuating minimally. She does not see a podiatrist.Eye exam is current.  a1C 6.8 Pain in right breast on two different days. One day in February and one day two weeks ago. Sharp pain in breast only lasted a second or two.  Patient states she's done her own breast exam is not felt anything last mammogram back in July looked good she denies any severe troubles just sharp pain that occurred couple different times.  Patient does relate some tingling in her right hand goes to sleep when she is driving concerns or if this happens intermittently but not all the time Patient also relates some intermittent tingling in the feet this comes and goes. Scalp itchy for the past year. Tried rosemary.    Review of Systems  Constitutional: Negative for activity change, appetite change and fatigue.  HENT: Negative for congestion.   Respiratory: Negative for cough.   Cardiovascular: Negative for chest pain.  Gastrointestinal: Negative for abdominal pain.  Endocrine: Negative for polydipsia and polyphagia.  Neurological: Negative for weakness.  Psychiatric/Behavioral: Negative for confusion.       Objective:   Physical Exam  Constitutional: She appears well-nourished. No distress.  Cardiovascular: Normal rate, regular rhythm and normal heart sounds.   No murmur heard. Pulmonary/Chest: Effort normal and breath  sounds normal. No respiratory distress.  Musculoskeletal: She exhibits no edema.  Lymphadenopathy:    She has no cervical adenopathy.  Neurological: She is alert. She exhibits normal muscle tone.  Psychiatric: Her behavior is normal.  Vitals reviewed.    Tingling feeling in feet Right hand intermittent falling asleep with driving     Assessment & Plan:  1. Type 2 diabetes mellitus without complication Diabetes actually under pretty good control continue current measures watch diet closely if having low sugar spells let us know. Patient will start doing insulin injections and top of her thighs because bothering her stomach - POCT glycosylated hemoglobin (Hb A1C) - Microalbumin, urine  2. Essential hypertension Blood pressure decent control continue current measures. Follow-up if any ongoing troubles recheck in 3-4 months - Hepatic function panel - Basic metabolic panel  3. Hyperlipidemia Lab work ordered await results healthy diet continue medications - Lipid panel  4. Carpal tunnel syndrome of right wrist Wear braces at nighttime hopefully this will help his symptoms if worse will need nerve conduction testing  5. Diabetic polyneuropathy associated with diabetes mellitus due to underlying condition Patient's A1c already looks good has minimal tingling in the feet will follow  6. Breast pain, right Breast exam perfectly normal I recommend repeating this again in 3 months time

## 2014-06-28 LAB — LIPID PANEL
Chol/HDL Ratio: 3.9 ratio units (ref 0.0–4.4)
Cholesterol, Total: 181 mg/dL (ref 100–199)
HDL: 47 mg/dL (ref >39)
LDL CALC: 114 mg/dL — AB (ref 0–99)
Triglycerides: 101 mg/dL (ref 0–149)
VLDL CHOLESTEROL CAL: 20 mg/dL (ref 5–40)

## 2014-06-28 LAB — BASIC METABOLIC PANEL
BUN/Creatinine Ratio: 9 — ABNORMAL LOW (ref 11–26)
BUN: 9 mg/dL (ref 8–27)
CO2: 27 mmol/L (ref 18–29)
Calcium: 10 mg/dL (ref 8.7–10.3)
Chloride: 99 mmol/L (ref 97–108)
Creatinine, Ser: 0.97 mg/dL (ref 0.57–1.00)
GFR, EST AFRICAN AMERICAN: 71 mL/min/{1.73_m2} (ref >59)
GFR, EST NON AFRICAN AMERICAN: 62 mL/min/{1.73_m2} (ref >59)
GLUCOSE: 131 mg/dL — AB (ref 65–99)
POTASSIUM: 4.6 mmol/L (ref 3.5–5.2)
Sodium: 141 mmol/L (ref 134–144)

## 2014-06-28 LAB — HEPATIC FUNCTION PANEL
ALT: 32 IU/L (ref 0–32)
AST: 26 IU/L (ref 0–40)
Albumin: 4.2 g/dL (ref 3.6–4.8)
Alkaline Phosphatase: 138 IU/L — ABNORMAL HIGH (ref 39–117)
Bilirubin Total: 0.3 mg/dL (ref 0.0–1.2)
Bilirubin, Direct: 0.07 mg/dL (ref 0.00–0.40)
Total Protein: 7.3 g/dL (ref 6.0–8.5)

## 2014-06-28 LAB — MICROALBUMIN, URINE: MICROALBUM., U, RANDOM: 4.8 ug/mL

## 2014-07-03 ENCOUNTER — Encounter: Payer: Self-pay | Admitting: Family Medicine

## 2014-07-18 ENCOUNTER — Other Ambulatory Visit: Payer: Self-pay | Admitting: Family Medicine

## 2014-07-18 DIAGNOSIS — Z1231 Encounter for screening mammogram for malignant neoplasm of breast: Secondary | ICD-10-CM

## 2014-07-26 ENCOUNTER — Ambulatory Visit (HOSPITAL_COMMUNITY)
Admission: RE | Admit: 2014-07-26 | Discharge: 2014-07-26 | Disposition: A | Discharge status: Home / Self Care | Payer: Medicare HMO | Source: Ambulatory Visit | Attending: Family Medicine | Admitting: Family Medicine

## 2014-07-26 DIAGNOSIS — Z1231 Encounter for screening mammogram for malignant neoplasm of breast: Secondary | ICD-10-CM

## 2014-08-28 ENCOUNTER — Ambulatory Visit: Payer: Medicare HMO | Admitting: Family Medicine

## 2014-09-22 ENCOUNTER — Ambulatory Visit (INDEPENDENT_AMBULATORY_CARE_PROVIDER_SITE_OTHER): Payer: Medicare HMO | Admitting: Family Medicine

## 2014-09-22 VITALS — BP 122/68 | Ht 67.0 in | Wt 308.2 lb

## 2014-09-22 DIAGNOSIS — E119 Type 2 diabetes mellitus without complications: Secondary | ICD-10-CM

## 2014-09-22 DIAGNOSIS — L600 Ingrowing nail: Secondary | ICD-10-CM

## 2014-09-22 DIAGNOSIS — Z23 Encounter for immunization: Secondary | ICD-10-CM | POA: Diagnosis not present

## 2014-09-22 DIAGNOSIS — E785 Hyperlipidemia, unspecified: Secondary | ICD-10-CM

## 2014-09-22 LAB — POCT GLYCOSYLATED HEMOGLOBIN (HGB A1C): Hemoglobin A1C: 6.3

## 2014-09-22 NOTE — Progress Notes (Signed)
   Subjective:    Patient ID: [name], female    DOB: 03-07-1950, 64 y.o.   MRN: [id number]  Diabetes She presents for her follow-up diabetic visit. She has type 2 diabetes mellitus. Pertinent negatives for hypoglycemia include no confusion. Pertinent negatives for diabetes include no chest pain, no fatigue, no polydipsia, no polyphagia and no weakness. Risk factors for coronary artery disease include diabetes mellitus, dyslipidemia and hypertension. Current diabetic treatment includes oral agent (dual therapy) and insulin injections. She is compliant with treatment all of the time. Her weight is stable. She is following a diabetic diet. She has not had a previous visit with a dietitian. She does not see a podiatrist.Eye exam is current.   Hyperlipidemia patient's last LDL above the goal but she is on the significant dose of Lipitor cannot afford to be on Crestor she does try to watch her diet  She relates she has what she thinks is ingrown toenail on the left side it is not bothering her much right now but at some point in time she would like to get that taken care of.  Review of Systems  Constitutional: Negative for activity change, appetite change and fatigue.  HENT: Negative for congestion.   Respiratory: Negative for cough.   Cardiovascular: Negative for chest pain.  Gastrointestinal: Negative for abdominal pain.  Endocrine: Negative for polydipsia and polyphagia.  Neurological: Negative for weakness.  Psychiatric/Behavioral: Negative for confusion.       Objective:   Physical Exam  Constitutional: She appears well-nourished. No distress.  Cardiovascular: Normal rate, regular rhythm and normal heart sounds.   No murmur heard. Pulmonary/Chest: Effort normal and breath sounds normal. No respiratory distress.  Musculoskeletal: She exhibits no edema.  Lymphadenopathy:    She has no cervical adenopathy.  Neurological: She is alert. She exhibits normal muscle tone.    Psychiatric: Her behavior is normal.  Vitals reviewed.   Diabetic foot exam normal. Pulses are good. Sensation good. Ingrown toenail on the left side not infected. I recommended podiatry referral      Assessment & Plan:  1. Diabetes mellitus without complication E7M looks under good control rarely having low sugar spells no need to do any type of changes at this time watch diet closely stay physically active - POCT glycosylated hemoglobin (Hb A1C)  2. Hyperlipidemia On next visit we will do lab work. She was encouraged to continue her cholesterol medicine watch diet try to lose weight  3. Ingrown nail She does have an ingrown toenail but she does not want see podiatrist just yet until she talks with her daughter to find out the name of podiatry in [place] she will call us back with thank you     4. Encounter for immunization Flu shot today

## 2014-09-27 ENCOUNTER — Telehealth: Payer: Self-pay | Admitting: Family Medicine

## 2014-09-27 DIAGNOSIS — E119 Type 2 diabetes mellitus without complications: Secondary | ICD-10-CM

## 2014-09-27 DIAGNOSIS — L600 Ingrowing nail: Secondary | ICD-10-CM

## 2014-09-27 NOTE — Telephone Encounter (Signed)
Referral ordered in Epic. 

## 2014-09-27 NOTE — Telephone Encounter (Signed)
Patient called to give name of podiatry office she would like to be referred to  [facility]  No referral in system but it is mentioned in the last office note, please initiate in system so that I may process

## 2014-09-27 NOTE — Telephone Encounter (Signed)
Please refer there for ingrown nail and diabetes

## 2014-10-18 ENCOUNTER — Encounter: Payer: Self-pay | Admitting: Family Medicine

## 2014-10-31 ENCOUNTER — Ambulatory Visit (INDEPENDENT_AMBULATORY_CARE_PROVIDER_SITE_OTHER): Payer: Medicare HMO | Admitting: Orthopedic Surgery

## 2014-10-31 ENCOUNTER — Ambulatory Visit (INDEPENDENT_AMBULATORY_CARE_PROVIDER_SITE_OTHER): Payer: Medicare HMO

## 2014-10-31 VITALS — BP 119/52 | Ht 67.5 in | Wt 305.0 lb

## 2014-10-31 DIAGNOSIS — M1710 Unilateral primary osteoarthritis, unspecified knee: Secondary | ICD-10-CM

## 2014-10-31 DIAGNOSIS — Z96652 Presence of left artificial knee joint: Secondary | ICD-10-CM | POA: Diagnosis not present

## 2014-10-31 DIAGNOSIS — M129 Arthropathy, unspecified: Secondary | ICD-10-CM | POA: Diagnosis not present
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
## 2014-12-14 ENCOUNTER — Encounter: Payer: Self-pay | Admitting: Family Medicine

## 2014-12-14 ENCOUNTER — Ambulatory Visit (INDEPENDENT_AMBULATORY_CARE_PROVIDER_SITE_OTHER): Payer: Medicare HMO | Admitting: Family Medicine

## 2014-12-14 VITALS — BP 122/82 | Ht 67.0 in | Wt 315.8 lb

## 2014-12-14 DIAGNOSIS — L299 Pruritus, unspecified: Secondary | ICD-10-CM | POA: Diagnosis not present

## 2014-12-14 MED ORDER — CLOBETASOL PROPIONATE 0.05 % EX SHAM: MEDICATED_SHAMPOO | CUTANEOUS | Status: DC

## 2014-12-14 NOTE — Progress Notes (Signed)
   Subjective:    Patient ID: [name], female    DOB: January 29, 1950, 64 y.o.   MRN: [id number]  HPI  Patient arrives with c/o itching scalp for a while Patient relates itching a lot she is tried various measures hasn't had any success it frustrates her she's tried multiple different over-the-counter measures doesn't know of anything that triggered this never had this before been going on for about a month has underlying diabetes Review of Systems    no fevers no pus drainage no loss of here other than what is been normal for age no weight change Objective:   Physical Exam  I find no evidence of any type of skin abnormality she does have some hair thinning which I believe is going along with age there is no scaling no pustules no redness  I find no evidence of cellulitis    Assessment & Plan:  Scalp itching I find no evidence of any underlying issue we will try a steroid shampoo that patient's request referral to dermatology

## 2014-12-23 ENCOUNTER — Other Ambulatory Visit: Payer: Self-pay | Admitting: Family Medicine

## 2014-12-25 ENCOUNTER — Other Ambulatory Visit: Payer: Self-pay

## 2014-12-25 ENCOUNTER — Telehealth: Payer: Self-pay | Admitting: Family Medicine

## 2014-12-25 MED ORDER — STERILANCE TL MISC: Status: DC

## 2014-12-25 NOTE — Telephone Encounter (Signed)
May have 6 months refills

## 2014-12-25 NOTE — Telephone Encounter (Signed)
Medication has not been filled since April this year. Is it ok to fill?

## 2014-12-25 NOTE — Telephone Encounter (Signed)
Lancets (STERILANCE TL) MISC   Unclear if this is what the patient is wanting, do we have a pharmacy  Request from [facility] for this? She was very hard to understand  An she kept saying she needed one sent to them that helps her give her the  Injections in her tummy.

## 2014-12-26 ENCOUNTER — Other Ambulatory Visit: Payer: Self-pay | Admitting: Family Medicine

## 2014-12-26 ENCOUNTER — Other Ambulatory Visit: Payer: Self-pay

## 2014-12-26 MED ORDER — GLUCOSE BLOOD VI STRP: ORAL_STRIP | Status: DC

## 2014-12-26 MED ORDER — INSULIN PEN NEEDLE 32G X 4 MM MISC: Status: DC

## 2014-12-26 MED ORDER — STERILANCE TL MISC: Status: DC

## 2014-12-26 NOTE — Telephone Encounter (Signed)
Spoke with patient and clarified medications. Lancets and ulticare sent to [facility], and all other meds sent into [facility].

## 2014-12-26 NOTE — Telephone Encounter (Signed)
[facility] to clarify with pt what she needs

## 2014-12-26 NOTE — Telephone Encounter (Signed)
Informed patient that medication was sent into pharmacy. Patient verbalized understanding.

## 2015-01-25 ENCOUNTER — Ambulatory Visit (INDEPENDENT_AMBULATORY_CARE_PROVIDER_SITE_OTHER): Payer: PPO | Admitting: Family Medicine

## 2015-01-25 ENCOUNTER — Encounter: Payer: Self-pay | Admitting: Family Medicine

## 2015-01-25 VITALS — BP 116/64 | Ht 67.0 in | Wt 317.1 lb

## 2015-01-25 DIAGNOSIS — I10 Essential (primary) hypertension: Secondary | ICD-10-CM

## 2015-01-25 DIAGNOSIS — E119 Type 2 diabetes mellitus without complications: Secondary | ICD-10-CM

## 2015-01-25 DIAGNOSIS — E785 Hyperlipidemia, unspecified: Secondary | ICD-10-CM

## 2015-01-25 DIAGNOSIS — M1990 Unspecified osteoarthritis, unspecified site: Secondary | ICD-10-CM | POA: Diagnosis not present

## 2015-01-25 DIAGNOSIS — Z794 Long term (current) use of insulin: Secondary | ICD-10-CM

## 2015-01-25 DIAGNOSIS — M25521 Pain in right elbow: Secondary | ICD-10-CM | POA: Diagnosis not present

## 2015-01-25 DIAGNOSIS — Z79899 Other long term (current) drug therapy: Secondary | ICD-10-CM | POA: Diagnosis not present

## 2015-01-25 DIAGNOSIS — M19042 Primary osteoarthritis, left hand: Secondary | ICD-10-CM

## 2015-01-25 LAB — POCT GLYCOSYLATED HEMOGLOBIN (HGB A1C): Hemoglobin A1C: 6.6

## 2015-01-25 MED ORDER — GLIPIZIDE 5 MG PO TABS: ORAL_TABLET | ORAL | Status: DC

## 2015-01-25 MED ORDER — METFORMIN HCL 500 MG PO TABS: 500.0000 mg | ORAL_TABLET | Freq: Two times a day (BID) | ORAL | Status: DC

## 2015-01-25 MED ORDER — ATORVASTATIN CALCIUM 80 MG PO TABS: 80.0000 mg | ORAL_TABLET | Freq: Every day | ORAL | Status: DC

## 2015-01-25 NOTE — Progress Notes (Signed)
   Subjective:    Patient ID: [name], female    DOB: Sep 20, 1950, 65 y.o.   MRN: [id number]  Diabetes She presents for her follow-up diabetic visit. She has type 2 diabetes mellitus. There are no hypoglycemic associated symptoms. Pertinent negatives for hypoglycemia include no confusion. There are no diabetic associated symptoms. Pertinent negatives for diabetes include no chest pain, no fatigue, no polydipsia, no polyphagia and no weakness. There are no hypoglycemic complications. There are no diabetic complications. There are no known risk factors for coronary artery disease. Current diabetic treatment includes insulin injections. She is compliant with treatment all of the time.   Diabetic eye exam- 04/2014  Patient has right elbow pain. Onset about 3 months ago. Intermittent sharp pains on outer aspect of the elbow no known injury.  Patient states that she has shaking in her left hand also. She relates occasional tremor she also relates pain and discomfort at the base of her left thumb. Some discomfort with movement of the hand. Denies any injury.  Patient takes her cholesterol medicine on a regular basis denies any problems with the medicine. Review of Systems  Constitutional: Negative for activity change, appetite change and fatigue.  HENT: Negative for congestion.   Respiratory: Negative for cough.   Cardiovascular: Negative for chest pain.  Gastrointestinal: Negative for abdominal pain.  Endocrine: Negative for polydipsia and polyphagia.  Neurological: Negative for weakness.  Psychiatric/Behavioral: Negative for confusion.       Objective:   Physical Exam  Constitutional: She appears well-nourished. No distress.  Cardiovascular: Normal rate, regular rhythm and normal heart sounds.   No murmur heard. Pulmonary/Chest: Effort normal and breath sounds normal. No respiratory distress.  Musculoskeletal: She exhibits no edema.  Lymphadenopathy:    She has no cervical  adenopathy.  Neurological: She is alert. She exhibits normal muscle tone.  Psychiatric: Her behavior is normal.  Vitals reviewed.    Subjective discomfort at the base of the left thumb no swelling noted no significant tremor noted right elbow no deformity noted subjective area of discomfort no problems were seen    25 minutes was spent with the patient. Greater than half the time was spent in discussion and answering questions and counseling regarding the issues that the patient came in for today.  Assessment & Plan:  1. Type 2 diabetes mellitus without complication, with long-term current use of insulin (HCC) A1c overall good continue current medications - POCT glycosylated hemoglobin (Hb A1C)  2. Essential hypertension Blood pressure good control continue current measures minimize salt  3. Type 2 diabetes mellitus with HbA1C goal below 7.5 See above  4. Hyperlipemia Continue statin check lab work - Lipid panel  5. High risk medication use Because of medications check kidney function liver function - Basic metabolic panel - Hepatic function panel  6. Elbow pain, right If pain worse follow-up no tests indicated currently  7. Arthritis of left hand Probable overuse arthritis issues no testing necessary currently follow-up if worse

## 2015-01-26 LAB — BASIC METABOLIC PANEL
BUN / CREAT RATIO: 9 — AB (ref 11–26)
BUN: 8 mg/dL (ref 8–27)
CALCIUM: 9.4 mg/dL (ref 8.7–10.3)
CO2: 25 mmol/L (ref 18–29)
Chloride: 97 mmol/L (ref 96–106)
Creatinine, Ser: 0.87 mg/dL (ref 0.57–1.00)
GFR, EST AFRICAN AMERICAN: 81 mL/min/{1.73_m2} (ref >59)
GFR, EST NON AFRICAN AMERICAN: 71 mL/min/{1.73_m2} (ref >59)
Glucose: 175 mg/dL — ABNORMAL HIGH (ref 65–99)
POTASSIUM: 4.5 mmol/L (ref 3.5–5.2)
SODIUM: 138 mmol/L (ref 134–144)

## 2015-01-26 LAB — LIPID PANEL
CHOL/HDL RATIO: 4.1 ratio (ref 0.0–4.4)
CHOLESTEROL TOTAL: 194 mg/dL (ref 100–199)
HDL: 47 mg/dL (ref >39)
LDL CALC: 131 mg/dL — AB (ref 0–99)
Triglycerides: 81 mg/dL (ref 0–149)
VLDL Cholesterol Cal: 16 mg/dL (ref 5–40)

## 2015-01-26 LAB — HEPATIC FUNCTION PANEL
ALT: 26 IU/L (ref 0–32)
AST: 24 IU/L (ref 0–40)
Albumin: 4.2 g/dL (ref 3.6–4.8)
Alkaline Phosphatase: 161 IU/L — ABNORMAL HIGH (ref 39–117)
Bilirubin Total: 0.3 mg/dL (ref 0.0–1.2)
Bilirubin, Direct: 0.1 mg/dL (ref 0.00–0.40)
Total Protein: 7.3 g/dL (ref 6.0–8.5)

## 2015-01-27 ENCOUNTER — Encounter: Payer: Self-pay | Admitting: Family Medicine

## 2015-02-26 ENCOUNTER — Telehealth: Payer: Self-pay | Admitting: Family Medicine

## 2015-02-26 NOTE — Telephone Encounter (Signed)
Pt is wanting to know if you would recommend for her to get a pair of diabetic shoes? Her sister has a company that supplies them and she told her to ask you if  It was ok if they sent a request to you for them to supply the shoes to her?

## 2015-02-27 NOTE — Telephone Encounter (Signed)
Certainly we can write a prescription if she is requesting one. But she should know that insurance companies and Medicare do not pay for diabetic shoes unless the patient has neuropathy of the feet along with foot deformities or pre-ulcerative calluses. On her last documented foot exam she had normal sensation therefore would not qualify under Medicare guidelines. If patient feels that she does now in fact have neuropathy and other deformity issues to go along with that she can set up a follow-up regarding her foot exam so that we can document that or she can wait till her next standard follow-up visit

## 2015-02-27 NOTE — Telephone Encounter (Signed)
Spoke with patient and informed her per Dr.Scott Luking-Certainly we can write a prescription if she is requesting one. But she should know that insurance companies and Medicare do not pay for diabetic shoes unless the patient has neuropathy of the feet along with foot deformities or pre-ulcerative calluses. On her last documented foot exam she had normal sensation therefore would not qualify under Medicare guidelines. If patient feels that she does now in fact have neuropathy and other deformity issues to go along with that she can set up a follow-up regarding her foot exam so that we can document that or she can wait till her next standard follow-up visit. Patient verbalized understanding and stated that she would like to have an office visit with Dr.Scott Luking to have foot exam. Patient transferred to front desk for office visit.

## 2015-03-13 ENCOUNTER — Encounter: Payer: Self-pay | Admitting: Family Medicine

## 2015-03-13 ENCOUNTER — Ambulatory Visit (INDEPENDENT_AMBULATORY_CARE_PROVIDER_SITE_OTHER): Payer: Medicare Other | Admitting: Family Medicine

## 2015-03-13 VITALS — BP 128/68 | Temp 98.1°F | Ht 67.0 in | Wt 318.0 lb

## 2015-03-13 DIAGNOSIS — G5603 Carpal tunnel syndrome, bilateral upper limbs: Secondary | ICD-10-CM | POA: Diagnosis not present

## 2015-03-13 DIAGNOSIS — E1342 Other specified diabetes mellitus with diabetic polyneuropathy: Secondary | ICD-10-CM | POA: Diagnosis not present

## 2015-03-13 DIAGNOSIS — J0120 Acute ethmoidal sinusitis, unspecified: Secondary | ICD-10-CM

## 2015-03-13 MED ORDER — GABAPENTIN 100 MG PO CAPS: 100.0000 mg | ORAL_CAPSULE | Freq: Three times a day (TID) | ORAL | Status: DC

## 2015-03-13 MED ORDER — AMOXICILLIN 500 MG PO CAPS: 500.0000 mg | ORAL_CAPSULE | Freq: Three times a day (TID) | ORAL | Status: DC

## 2015-03-13 NOTE — Patient Instructions (Signed)
USE OVER THE COUNTER LORATADINE 10MG  ONE A DAY FOR ALLERGIES.

## 2015-03-13 NOTE — Progress Notes (Signed)
   Subjective:    Patient ID: [name], female    DOB: 21-Apr-1950, 65 y.o.   MRN: [id number]  Diabetes She presents for her follow-up diabetic visit. She has type 2 diabetes mellitus. Pertinent negatives for hypoglycemia include no confusion. Pertinent negatives for diabetes include no chest pain, no fatigue, no polydipsia, no polyphagia and no weakness. Current diabetic treatment includes insulin injections and oral agent (dual therapy). She is compliant with treatment all of the time. She is following a diabetic diet. [facility] She sees a podiatrist.Eye exam is current.   Having bilateral foot pain. Started last year. Burning and tingling She is done a good job try to watch her diet to some degree. She is try to keep her numbers under good control. She describes as a burning pain discomfort tingling. She denies any other particular troubles. A1C done on bw in January.   Bilateral hand numbness. Started last year. Going on for a year. Worse during the day. Just numbness. Yellow nasal discharge Sneezing, cough. Started  Weeks ago.  No wheezing , no doe   Sweating a lot. Started over 1 year ago. She denies weight loss denies waking up with sweats just at times she does get hot flashes. Does not seem to be excessive.   Review of Systems  Constitutional: Negative for activity change, appetite change and fatigue.  HENT: Negative for congestion.   Respiratory: Negative for cough.   Cardiovascular: Negative for chest pain.  Gastrointestinal: Negative for abdominal pain.  Endocrine: Negative for polydipsia and polyphagia.  Neurological: Negative for weakness.  Psychiatric/Behavioral: Negative for confusion.       Objective:   Physical Exam  Constitutional: She appears well-nourished. No distress.  Cardiovascular: Normal rate, regular rhythm and normal heart sounds.   No murmur heard. Pulmonary/Chest: Effort normal and breath sounds normal. No  respiratory distress.  Musculoskeletal: She exhibits no edema.  Lymphadenopathy:    She has no cervical adenopathy.  Neurological: She is alert. She exhibits normal muscle tone.  Skin: Skin is warm. No rash noted. No erythema.  Psychiatric: Her behavior is normal.  Vitals reviewed.  diabetic foot exam monofilament has difficult time sensing that. Subjective numbness tingling. No abscess is no ulcers no deformities patient does have large feet  25 minutes was spent with the patient. Greater than half the time was spent in discussion and answering questions and counseling regarding the issues that the patient came in for today.       Assessment & Plan:  1. Bilateral carpal tunnel syndrome Nerve conduction study ordered. Needs to be done locally for patient's preference. Should do well with this. - Ambulatory referral to Neurology - CBC with Differential/Platelet - Vitamin B12  2. Secondary diabetes with peripheral neuropathy (HCC) Diabetic neuropathy as well. Tried gabapentin to see if this helps with the neuropathy in the feet if it causes drowsiness let us know - CBC with Differential/Platelet - Vitamin B12  3. Acute ethmoidal sinusitis, recurrence not specified Moderate sinus infection antibiotics prescribed - CBC with Differential/Platelet - Vitamin B12  4. Morbid obesity, unspecified obesity type ([place]) Importance of watching diet losing weight discussed.

## 2015-03-19 ENCOUNTER — Telehealth: Payer: Self-pay | Admitting: Family Medicine

## 2015-03-19 NOTE — Telephone Encounter (Signed)
Script was hand written and given to pt at her office visit. Pt states she has lost RX. Rx redone and sent to [facility]. Pt notified.

## 2015-03-19 NOTE — Telephone Encounter (Signed)
Pt was seen last week and was told Dr Nicki Reaper would be faxing in a script for hand braces for both hands  Can we send that into [facility] please

## 2015-03-20 ENCOUNTER — Encounter: Payer: Self-pay | Admitting: Family Medicine

## 2015-03-20 DIAGNOSIS — G5601 Carpal tunnel syndrome, right upper limb: Secondary | ICD-10-CM | POA: Diagnosis not present

## 2015-03-20 DIAGNOSIS — G5602 Carpal tunnel syndrome, left upper limb: Secondary | ICD-10-CM | POA: Diagnosis not present

## 2015-03-28 DIAGNOSIS — G5603 Carpal tunnel syndrome, bilateral upper limbs: Secondary | ICD-10-CM | POA: Diagnosis not present

## 2015-03-28 DIAGNOSIS — R208 Other disturbances of skin sensation: Secondary | ICD-10-CM | POA: Diagnosis not present

## 2015-03-28 DIAGNOSIS — M79673 Pain in unspecified foot: Secondary | ICD-10-CM | POA: Diagnosis not present

## 2015-03-28 DIAGNOSIS — E1342 Other specified diabetes mellitus with diabetic polyneuropathy: Secondary | ICD-10-CM | POA: Diagnosis not present

## 2015-04-09 DIAGNOSIS — G5603 Carpal tunnel syndrome, bilateral upper limbs: Secondary | ICD-10-CM | POA: Diagnosis not present

## 2015-04-09 DIAGNOSIS — E1342 Other specified diabetes mellitus with diabetic polyneuropathy: Secondary | ICD-10-CM | POA: Diagnosis not present

## 2015-04-09 DIAGNOSIS — J0120 Acute ethmoidal sinusitis, unspecified: Secondary | ICD-10-CM | POA: Diagnosis not present

## 2015-04-10 ENCOUNTER — Encounter: Payer: Self-pay | Admitting: Family Medicine

## 2015-04-10 LAB — CBC WITH DIFFERENTIAL/PLATELET
BASOS ABS: 0 10*3/uL (ref 0.0–0.2)
Basos: 0 %
EOS (ABSOLUTE): 0.3 10*3/uL (ref 0.0–0.4)
Eos: 4 %
Hematocrit: 38.8 % (ref 34.0–46.6)
Hemoglobin: 12.7 g/dL (ref 11.1–15.9)
IMMATURE GRANS (ABS): 0 10*3/uL (ref 0.0–0.1)
IMMATURE GRANULOCYTES: 0 %
LYMPHS: 37 %
Lymphocytes Absolute: 2.9 10*3/uL (ref 0.7–3.1)
MCH: 27.7 pg (ref 26.6–33.0)
MCHC: 32.7 g/dL (ref 31.5–35.7)
MCV: 85 fL (ref 79–97)
Monocytes Absolute: 0.4 10*3/uL (ref 0.1–0.9)
Monocytes: 5 %
NEUTROS PCT: 54 %
Neutrophils Absolute: 4.1 10*3/uL (ref 1.4–7.0)
PLATELETS: 257 10*3/uL (ref 150–379)
RBC: 4.58 x10E6/uL (ref 3.77–5.28)
RDW: 14.3 % (ref 12.3–15.4)
WBC: 7.7 10*3/uL (ref 3.4–10.8)

## 2015-04-10 LAB — VITAMIN B12: Vitamin B-12: 900 pg/mL (ref 211–946)

## 2015-04-25 ENCOUNTER — Other Ambulatory Visit: Payer: Self-pay | Admitting: Family Medicine

## 2015-05-10 DIAGNOSIS — G5603 Carpal tunnel syndrome, bilateral upper limbs: Secondary | ICD-10-CM | POA: Diagnosis not present

## 2015-05-16 ENCOUNTER — Encounter: Payer: Self-pay | Admitting: Family Medicine

## 2015-05-16 DIAGNOSIS — G5600 Carpal tunnel syndrome, unspecified upper limb: Secondary | ICD-10-CM | POA: Insufficient documentation

## 2015-06-30 ENCOUNTER — Other Ambulatory Visit: Payer: Self-pay | Admitting: Family Medicine

## 2015-07-02 ENCOUNTER — Encounter: Payer: Self-pay | Admitting: Family Medicine

## 2015-07-02 ENCOUNTER — Ambulatory Visit (INDEPENDENT_AMBULATORY_CARE_PROVIDER_SITE_OTHER): Payer: Medicare Other | Admitting: Family Medicine

## 2015-07-02 VITALS — BP 134/68 | Ht 67.0 in | Wt 326.8 lb

## 2015-07-02 DIAGNOSIS — R600 Localized edema: Secondary | ICD-10-CM | POA: Diagnosis not present

## 2015-07-02 DIAGNOSIS — R0600 Dyspnea, unspecified: Secondary | ICD-10-CM

## 2015-07-02 DIAGNOSIS — R0609 Other forms of dyspnea: Secondary | ICD-10-CM | POA: Diagnosis not present

## 2015-07-02 MED ORDER — FUROSEMIDE 20 MG PO TABS: 20.0000 mg | ORAL_TABLET | Freq: Every morning | ORAL | Status: DC

## 2015-07-02 MED ORDER — POTASSIUM CHLORIDE ER 10 MEQ PO TBCR: 10.0000 meq | EXTENDED_RELEASE_TABLET | Freq: Every morning | ORAL | Status: DC

## 2015-07-02 NOTE — Progress Notes (Signed)
   Subjective:    Patient ID: [name], female    DOB: Mar 09, 1950, 65 y.o.   MRN: [id number]  HPISwelling in both legs. Some leg pain, fatigue, shortness of breath. Started 2 days ago.   Patient does relate some fatigue and tiredness and shortness of breath she denies PND denies orthopnea denies chest heaviness pressure pain she denies abdominal pain jaw pain denies sweats or chills. PMH benign Patient does have a history of diabetes and hypertension as well as obesity Review of Systems    see above. Negative for orthopnea positive for DOE negative for chest pressure tightness positive for pedal edema denies fever chills cough Objective:   Physical Exam Significant pitting edema in the lower legs. All we have to mid tibia. Lungs are clear no crackles heart regular pulse normal BP good  Patient unable to do blood work currently but states she will get it done in the morning. I believe that would be fine     Assessment & Plan:  Significant pedal edema. Blood pressure good. No signs of CHF on exam but she does have some DOE we will do but go ahead and start some lab work as well as medication Lasix with potassium patient will recheck with [place] in 72 hours I find no evidence of any overt CHF so therefore I would not recommend ER or hospitalizations recheck patient in 72 hours

## 2015-07-03 ENCOUNTER — Other Ambulatory Visit (HOSPITAL_COMMUNITY)
Admission: RE | Admit: 2015-07-03 | Discharge: 2015-07-03 | Disposition: A | Discharge status: Home / Self Care | Payer: Medicare Other | Source: Ambulatory Visit | Attending: Family Medicine | Admitting: Family Medicine

## 2015-07-03 DIAGNOSIS — R600 Localized edema: Secondary | ICD-10-CM | POA: Diagnosis not present

## 2015-07-03 DIAGNOSIS — R0609 Other forms of dyspnea: Secondary | ICD-10-CM | POA: Diagnosis not present

## 2015-07-03 LAB — BASIC METABOLIC PANEL
Anion gap: 7 (ref 5–15)
BUN: 11 mg/dL (ref 6–20)
CALCIUM: 8.9 mg/dL (ref 8.9–10.3)
CO2: 27 mmol/L (ref 22–32)
CREATININE: 0.89 mg/dL (ref 0.44–1.00)
Chloride: 102 mmol/L (ref 101–111)
GFR calc Af Amer: >60 mL/min (ref >60)
GLUCOSE: 140 mg/dL — AB (ref 65–99)
Potassium: 3.9 mmol/L (ref 3.5–5.1)
SODIUM: 136 mmol/L (ref 135–145)

## 2015-07-03 LAB — BRAIN NATRIURETIC PEPTIDE: B NATRIURETIC PEPTIDE 5: 32 pg/mL (ref 0.0–100.0)

## 2015-07-05 ENCOUNTER — Ambulatory Visit (INDEPENDENT_AMBULATORY_CARE_PROVIDER_SITE_OTHER): Payer: Medicare Other | Admitting: Family Medicine

## 2015-07-05 ENCOUNTER — Encounter: Payer: Self-pay | Admitting: Family Medicine

## 2015-07-05 VITALS — BP 138/74 | Ht 67.0 in | Wt 317.0 lb

## 2015-07-05 DIAGNOSIS — R600 Localized edema: Secondary | ICD-10-CM

## 2015-07-05 NOTE — Progress Notes (Signed)
   Subjective:    Patient ID: [name], female    DOB: 06-Apr-1950, 65 y.o.   MRN: [id number]  HPIFollow up pedal edema. Pt states swelling is better.   No other concerns today.  Lab work from last time showed good kidney function good potassium showed normal BMP no sign of CHF patient states that she's feeling better taking the medicine tolerating it well   Review of Systems Increased urination with diuretic otherwise denies chest tightness shortness breath PND orthopnea    Objective:   Physical Exam Lungs clear hearts regular abdomen soft extremities no edema skin warm dry pedal edema minimal currently       Assessment & Plan:  Pedal edema much improved continue current measures recheck met 7 in 3 weeks office visit 3 months follow-up if any problems, she will use a medication on as needed basis for the pedal edema

## 2015-07-19 ENCOUNTER — Other Ambulatory Visit: Payer: Self-pay | Admitting: Family Medicine

## 2015-07-19 DIAGNOSIS — Z1231 Encounter for screening mammogram for malignant neoplasm of breast: Secondary | ICD-10-CM

## 2015-07-25 ENCOUNTER — Ambulatory Visit: Payer: PPO | Admitting: Family Medicine

## 2015-07-30 ENCOUNTER — Other Ambulatory Visit: Payer: Self-pay | Admitting: Family Medicine

## 2015-07-30 ENCOUNTER — Ambulatory Visit (HOSPITAL_COMMUNITY): Payer: Medicare HMO

## 2015-07-30 ENCOUNTER — Ambulatory Visit (HOSPITAL_COMMUNITY)
Admission: RE | Admit: 2015-07-30 | Discharge: 2015-07-30 | Disposition: A | Discharge status: Home / Self Care | Payer: Medicare Other | Source: Ambulatory Visit | Attending: Family Medicine | Admitting: Family Medicine

## 2015-07-30 DIAGNOSIS — Z1231 Encounter for screening mammogram for malignant neoplasm of breast: Secondary | ICD-10-CM

## 2015-08-10 DIAGNOSIS — R600 Localized edema: Secondary | ICD-10-CM | POA: Diagnosis not present

## 2015-08-11 ENCOUNTER — Encounter: Payer: Self-pay | Admitting: Family Medicine

## 2015-08-11 LAB — BASIC METABOLIC PANEL
BUN/Creatinine Ratio: 14 (ref 12–28)
BUN: 12 mg/dL (ref 8–27)
CALCIUM: 9.6 mg/dL (ref 8.7–10.3)
CHLORIDE: 98 mmol/L (ref 96–106)
CO2: 23 mmol/L (ref 18–29)
Creatinine, Ser: 0.86 mg/dL (ref 0.57–1.00)
GFR calc Af Amer: 82 mL/min/{1.73_m2} (ref >59)
GFR, EST NON AFRICAN AMERICAN: 71 mL/min/{1.73_m2} (ref >59)
Glucose: 184 mg/dL — ABNORMAL HIGH (ref 65–99)
POTASSIUM: 4.5 mmol/L (ref 3.5–5.2)
SODIUM: 137 mmol/L (ref 134–144)

## 2015-09-28 ENCOUNTER — Other Ambulatory Visit: Payer: Self-pay | Admitting: Family Medicine

## 2015-10-05 ENCOUNTER — Encounter: Payer: Self-pay | Admitting: Family Medicine

## 2015-10-05 ENCOUNTER — Ambulatory Visit (INDEPENDENT_AMBULATORY_CARE_PROVIDER_SITE_OTHER): Payer: Medicare Other | Admitting: Family Medicine

## 2015-10-05 VITALS — BP 120/70 | Ht 67.0 in | Wt 315.0 lb

## 2015-10-05 DIAGNOSIS — E785 Hyperlipidemia, unspecified: Secondary | ICD-10-CM

## 2015-10-05 DIAGNOSIS — Z23 Encounter for immunization: Secondary | ICD-10-CM

## 2015-10-05 DIAGNOSIS — Z780 Asymptomatic menopausal state: Secondary | ICD-10-CM

## 2015-10-05 DIAGNOSIS — B029 Zoster without complications: Secondary | ICD-10-CM | POA: Diagnosis not present

## 2015-10-05 DIAGNOSIS — Z1159 Encounter for screening for other viral diseases: Secondary | ICD-10-CM | POA: Diagnosis not present

## 2015-10-05 DIAGNOSIS — Z794 Long term (current) use of insulin: Secondary | ICD-10-CM

## 2015-10-05 DIAGNOSIS — E119 Type 2 diabetes mellitus without complications: Secondary | ICD-10-CM

## 2015-10-05 DIAGNOSIS — I10 Essential (primary) hypertension: Secondary | ICD-10-CM | POA: Diagnosis not present

## 2015-10-05 DIAGNOSIS — G609 Hereditary and idiopathic neuropathy, unspecified: Secondary | ICD-10-CM | POA: Diagnosis not present

## 2015-10-05 LAB — POCT GLYCOSYLATED HEMOGLOBIN (HGB A1C): Hemoglobin A1C: 7.2

## 2015-10-05 MED ORDER — ATORVASTATIN CALCIUM 80 MG PO TABS: 80.0000 mg | ORAL_TABLET | Freq: Every day | ORAL | 1 refills | Status: DC

## 2015-10-05 MED ORDER — METFORMIN HCL 500 MG PO TABS: 500.0000 mg | ORAL_TABLET | Freq: Two times a day (BID) | ORAL | 1 refills | Status: DC

## 2015-10-05 MED ORDER — INSULIN DETEMIR 100 UNIT/ML FLEXPEN: PEN_INJECTOR | SUBCUTANEOUS | 5 refills | Status: DC

## 2015-10-05 MED ORDER — GABAPENTIN 100 MG PO CAPS: 100.0000 mg | ORAL_CAPSULE | Freq: Three times a day (TID) | ORAL | 5 refills | Status: DC

## 2015-10-05 MED ORDER — BLOOD GLUCOSE MONITOR KIT: PACK | 0 refills | Status: DC

## 2015-10-05 MED ORDER — VALACYCLOVIR HCL 1 G PO TABS: 1000.0000 mg | ORAL_TABLET | Freq: Three times a day (TID) | ORAL | 0 refills | Status: DC

## 2015-10-05 MED ORDER — GLUCOSE BLOOD VI STRP: ORAL_STRIP | 5 refills | Status: DC

## 2015-10-05 MED ORDER — GLIPIZIDE 5 MG PO TABS: ORAL_TABLET | ORAL | 1 refills | Status: DC

## 2015-10-05 MED ORDER — STERILANCE TL MISC: 3 refills | Status: DC

## 2015-10-05 NOTE — Progress Notes (Signed)
   Subjective:    Patient ID: [name], female    DOB: 08-01-1950, 65 y.o.   MRN: [id number]  HPI    Review of Systems     Objective:   Physical Exam        Assessment & Plan:

## 2015-10-05 NOTE — Progress Notes (Signed)
Subjective:    Patient ID: [name], female    DOB: 22-Nov-1950, 65 y.o.   MRN: [id number]  Diabetes  She presents for her follow-up diabetic visit. There are no hypoglycemic associated symptoms. Pertinent negatives for hypoglycemia include no headaches. There are no diabetic associated symptoms. Pertinent negatives for diabetes include no chest pain, no fatigue and no weakness. There are no hypoglycemic complications. There are no diabetic complications. There are no known risk factors for coronary artery disease. Current diabetic treatment includes insulin injections. She is compliant with treatment all of the time.   Patient thinks she has shingles. Onset 3 days ago.She relates a fair amount of burning and discomfort. Denies any shortness of breath wheezing. In the upper left chest. Treatments tried: gold bond lotion, no relief.   Patient also relates burning in her toes. Sometimes worse at night. She does not think she's taking gabapentin.  Patient does relate she takes her cholesterol medicine on a regular basis tries watch her diet  Patient has eye exam scheduled for next month.  Results for orders placed or performed in visit on 10/05/15  POCT glycosylated hemoglobin (Hb A1C)  Result Value Ref Range   Hemoglobin A1C 7.2      Review of Systems  Constitutional: Negative for activity change, appetite change and fatigue.  HENT: Negative for congestion, ear discharge and rhinorrhea.   Eyes: Negative for discharge.  Respiratory: Negative for cough, chest tightness and wheezing.   Cardiovascular: Negative for chest pain.  Gastrointestinal: Negative for abdominal pain and vomiting.  Genitourinary: Negative for difficulty urinating and frequency.  Musculoskeletal: Negative for neck pain.  Allergic/Immunologic: Negative for environmental allergies and food allergies.  Neurological: Negative for weakness and headaches.  Psychiatric/Behavioral: Negative for agitation and  behavioral problems.       Objective:   Physical Exam  Constitutional: She is oriented to person, place, and time. She appears well-developed and well-nourished.  HENT:  Head: Normocephalic.  Right Ear: External ear normal.  Left Ear: External ear normal.  Eyes: Pupils are equal, round, and reactive to light.  Neck: Normal range of motion. No thyromegaly present.  Cardiovascular: Normal rate, regular rhythm, normal heart sounds and intact distal pulses.   No murmur heard. Pulmonary/Chest: Effort normal and breath sounds normal. No respiratory distress. She has no wheezes.  Abdominal: Soft. Bowel sounds are normal. She exhibits no distension and no mass. There is no tenderness.  Musculoskeletal: Normal range of motion. She exhibits no edema or tenderness.  Lymphadenopathy:    She has no cervical adenopathy.  Neurological: She is alert and oriented to person, place, and time. She exhibits normal muscle tone.  Skin: Skin is warm and dry.  Psychiatric: She has a normal mood and affect. Her behavior is normal.    Played baseball as a youth      Assessment & Plan:  1. Type 2 diabetes mellitus without complication, with long-term current use of insulin (HCC) Diabetes A1c not quite as good was once was but doing acceptable at 7.2 watch diet try to stay physically active try to lose weight - POCT glycosylated hemoglobin (Hb A1C) - Microalbumin / creatinine urine ratio  2. Essential hypertension Blood pressure good control currently. Because of medications check lab work - Hepatic function panel - Basic metabolic panel  3. Type 2 diabetes mellitus with HbA1C goal below 7.5 See above. Importance of healthy diet minimize starches discussed  4. Hyperlipemia Previous labs reviewed with patient patient needs new labs. Continue  medication. Continue 81 mg aspirin to try to minimize risk of heart disease - Lipid panel  5. Morbid obesity, unspecified obesity type [facility]) Patient was  encouraged to try to lose weight.  6. Shingles Left upper chest shingles red patch Valtrex 1 g 3 times daily for the next 7 days  7. Need for hepatitis C screening test Screening hep C as recommended by CDC - Hepatitis C Antibody  8. Post-menopausal Bone density ordered await results - DG Bone Density  9. Need for vaccination Flu vaccine today - Flu Vaccine QUAD 36+ mos PF IM (Fluarix & Fluzone Quad PF) 25 minutes was spent with the patient. Greater than half the time was spent in discussion and answering questions and counseling regarding the issues that the patient came in for today. Patient with peripheral neuropathy probably related to the diabetes in her toes mainly burning sensation

## 2015-10-10 ENCOUNTER — Ambulatory Visit (HOSPITAL_COMMUNITY)
Admission: RE | Admit: 2015-10-10 | Discharge: 2015-10-10 | Disposition: A | Discharge status: Home / Self Care | Payer: Medicare Other | Source: Ambulatory Visit | Attending: Family Medicine | Admitting: Family Medicine

## 2015-10-10 DIAGNOSIS — Z780 Asymptomatic menopausal state: Secondary | ICD-10-CM | POA: Insufficient documentation

## 2015-10-10 DIAGNOSIS — Z794 Long term (current) use of insulin: Secondary | ICD-10-CM | POA: Insufficient documentation

## 2015-10-10 DIAGNOSIS — Z90711 Acquired absence of uterus with remaining cervical stump: Secondary | ICD-10-CM | POA: Insufficient documentation

## 2015-10-10 DIAGNOSIS — Z7982 Long term (current) use of aspirin: Secondary | ICD-10-CM | POA: Insufficient documentation

## 2015-10-10 DIAGNOSIS — E119 Type 2 diabetes mellitus without complications: Secondary | ICD-10-CM | POA: Insufficient documentation

## 2015-10-10 DIAGNOSIS — R29890 Loss of height: Secondary | ICD-10-CM | POA: Diagnosis not present

## 2015-10-10 DIAGNOSIS — E58 Dietary calcium deficiency: Secondary | ICD-10-CM | POA: Diagnosis not present

## 2015-10-10 DIAGNOSIS — E2839 Other primary ovarian failure: Secondary | ICD-10-CM | POA: Diagnosis not present

## 2015-10-12 DIAGNOSIS — Z1159 Encounter for screening for other viral diseases: Secondary | ICD-10-CM | POA: Diagnosis not present

## 2015-10-12 DIAGNOSIS — I10 Essential (primary) hypertension: Secondary | ICD-10-CM | POA: Diagnosis not present

## 2015-10-12 DIAGNOSIS — Z794 Long term (current) use of insulin: Secondary | ICD-10-CM | POA: Diagnosis not present

## 2015-10-12 DIAGNOSIS — E785 Hyperlipidemia, unspecified: Secondary | ICD-10-CM | POA: Diagnosis not present

## 2015-10-12 DIAGNOSIS — E119 Type 2 diabetes mellitus without complications: Secondary | ICD-10-CM | POA: Diagnosis not present

## 2015-10-13 LAB — BASIC METABOLIC PANEL WITH GFR
BUN/Creatinine Ratio: 12 (ref 12–28)
BUN: 12 mg/dL (ref 8–27)
CO2: 26 mmol/L (ref 18–29)
Calcium: 9.9 mg/dL (ref 8.7–10.3)
Chloride: 98 mmol/L (ref 96–106)
Creatinine, Ser: 0.97 mg/dL (ref 0.57–1.00)
GFR calc Af Amer: 71 mL/min/1.73
GFR calc non Af Amer: 61 mL/min/1.73
Glucose: 166 mg/dL — ABNORMAL HIGH (ref 65–99)
Potassium: 4.5 mmol/L (ref 3.5–5.2)
Sodium: 138 mmol/L (ref 134–144)

## 2015-10-13 LAB — MICROALBUMIN / CREATININE URINE RATIO
Creatinine, Urine: 192.8 mg/dL
MICROALB/CREAT RATIO: 9.3 mg/g creat (ref 0.0–30.0)
MICROALBUM., U, RANDOM: 18 ug/mL

## 2015-10-13 LAB — LIPID PANEL
CHOLESTEROL TOTAL: 210 mg/dL — AB (ref 100–199)
Chol/HDL Ratio: 4.6 ratio units — ABNORMAL HIGH (ref 0.0–4.4)
HDL: 46 mg/dL (ref >39)
LDL Calculated: 134 mg/dL — ABNORMAL HIGH (ref 0–99)
Triglycerides: 149 mg/dL (ref 0–149)
VLDL CHOLESTEROL CAL: 30 mg/dL (ref 5–40)

## 2015-10-13 LAB — HEPATITIS C ANTIBODY: Hep C Virus Ab: <0.1 s/co ratio (ref 0.0–0.9)

## 2015-10-13 LAB — HEPATIC FUNCTION PANEL
ALK PHOS: 156 IU/L — AB (ref 39–117)
ALT: 29 IU/L (ref 0–32)
AST: 26 IU/L (ref 0–40)
Albumin: 4.2 g/dL (ref 3.6–4.8)
BILIRUBIN, DIRECT: 0.1 mg/dL (ref 0.00–0.40)
Bilirubin Total: 0.3 mg/dL (ref 0.0–1.2)
Total Protein: 7.4 g/dL (ref 6.0–8.5)

## 2015-11-01 ENCOUNTER — Ambulatory Visit: Payer: Medicare HMO | Admitting: Orthopedic Surgery

## 2015-11-07 ENCOUNTER — Ambulatory Visit (INDEPENDENT_AMBULATORY_CARE_PROVIDER_SITE_OTHER): Payer: Medicare Other | Admitting: Orthopedic Surgery

## 2015-11-07 ENCOUNTER — Ambulatory Visit (INDEPENDENT_AMBULATORY_CARE_PROVIDER_SITE_OTHER): Payer: Medicare Other

## 2015-11-07 DIAGNOSIS — Z96652 Presence of left artificial knee joint: Secondary | ICD-10-CM

## 2015-11-07 DIAGNOSIS — M1710 Unilateral primary osteoarthritis, unspecified knee: Secondary | ICD-10-CM

## 2015-11-07 NOTE — Progress Notes (Signed)
Patient ID: [name], female   DOB: Mar 26, 1950, 65 y.o.   MRN: [id number]  Chief Complaint  Patient presents with  . Follow-up    left TKA, DOS 09/02/10    HPI [name] is a 65 y.o. female.   HPI 5 YR F/U ON LEFT TKA, ASSYMPTOMATIC  Review of Systems Review of Systems C/O BILATERAL LOWER BACK PAIN   Examination There were no vitals taken for this visit.  Gen. appearance the patient's appearance is normal with normal grooming and  hygiene The patient is oriented to person place and time Mood and affect are normal   Ortho Exam Gait is remarkable for NORMAL W/O LIMP  Inspection reveals NORMAL INCISION NO SWELLING  ROM is 120 DEGREES Stability tests are normal  Motor exam 5/5 manual muscle testing , no atrophy  Skin is normal (no rash or erythema)    Medical decision-making Diagnosis, Data, Plan (risk)  XRAYS  Radiology report  3 views LEFT knee  AP lateral and patellar sunrise views of the knee  FINDINGS: A total knee prosthesis is noted. It is in anatomic alignment. There is no evidence of loosening.  Impression : normal TKA  xray   RETURN 1 YR REPEAT XRAYS   Arther Abbott, MD 11/07/2015 9:18 AM

## 2015-11-21 DIAGNOSIS — H3540 Unspecified peripheral retinal degeneration: Secondary | ICD-10-CM | POA: Diagnosis not present

## 2015-11-21 DIAGNOSIS — E119 Type 2 diabetes mellitus without complications: Secondary | ICD-10-CM | POA: Diagnosis not present

## 2015-11-21 DIAGNOSIS — H25813 Combined forms of age-related cataract, bilateral: Secondary | ICD-10-CM | POA: Diagnosis not present

## 2015-11-21 LAB — HM DIABETES EYE EXAM

## 2015-12-01 ENCOUNTER — Other Ambulatory Visit: Payer: Self-pay | Admitting: Family Medicine

## 2015-12-04 ENCOUNTER — Encounter: Payer: Self-pay | Admitting: *Deleted

## 2016-01-03 ENCOUNTER — Other Ambulatory Visit: Payer: Self-pay | Admitting: Family Medicine

## 2016-01-18 ENCOUNTER — Other Ambulatory Visit: Payer: Self-pay | Admitting: Family Medicine

## 2016-03-13 ENCOUNTER — Telehealth: Payer: Self-pay | Admitting: Family Medicine

## 2016-03-13 NOTE — Telephone Encounter (Signed)
Patient is requesting prescription for one touch with test scripts and lancets to be called into [facility]  [place].

## 2016-03-13 NOTE — Telephone Encounter (Signed)
Notified patient script will be faxed to pharmacy.  

## 2016-03-21 ENCOUNTER — Other Ambulatory Visit: Payer: Self-pay | Admitting: Family Medicine

## 2016-03-21 ENCOUNTER — Telehealth: Payer: Self-pay | Admitting: Family Medicine

## 2016-03-21 ENCOUNTER — Other Ambulatory Visit: Payer: Self-pay

## 2016-03-21 MED ORDER — GLUCOSE BLOOD VI STRP: ORAL_STRIP | 5 refills | Status: DC

## 2016-03-21 MED ORDER — INSULIN PEN NEEDLE 32G X 4 MM MISC: 11 refills | Status: DC

## 2016-03-21 NOTE — Telephone Encounter (Signed)
Spoke with patient and informed her that refills were sent into pharmacy. Patient verbalized understanding.

## 2016-03-21 NOTE — Telephone Encounter (Signed)
Requesting Rx for one touch test strips and lancets.  She requested this on 03/13/16, but she said pharmacy does not have it.   [facility] [place]

## 2016-03-22 ENCOUNTER — Other Ambulatory Visit: Payer: Self-pay | Admitting: Family Medicine

## 2016-04-09 ENCOUNTER — Ambulatory Visit: Payer: Medicare Other | Admitting: Family Medicine

## 2016-04-14 ENCOUNTER — Ambulatory Visit: Payer: Medicare Other | Admitting: Family Medicine

## 2016-04-15 ENCOUNTER — Encounter: Payer: Self-pay | Admitting: Family Medicine

## 2016-04-23 ENCOUNTER — Other Ambulatory Visit: Payer: Self-pay | Admitting: Family Medicine

## 2016-04-24 ENCOUNTER — Encounter: Payer: Self-pay | Admitting: Family Medicine

## 2016-04-24 ENCOUNTER — Ambulatory Visit (INDEPENDENT_AMBULATORY_CARE_PROVIDER_SITE_OTHER): Payer: Medicare Other | Admitting: Family Medicine

## 2016-04-24 VITALS — BP 128/64 | Ht 67.0 in | Wt 313.4 lb

## 2016-04-24 DIAGNOSIS — E785 Hyperlipidemia, unspecified: Secondary | ICD-10-CM | POA: Diagnosis not present

## 2016-04-24 DIAGNOSIS — Z23 Encounter for immunization: Secondary | ICD-10-CM | POA: Diagnosis not present

## 2016-04-24 DIAGNOSIS — E119 Type 2 diabetes mellitus without complications: Secondary | ICD-10-CM

## 2016-04-24 LAB — POCT GLYCOSYLATED HEMOGLOBIN (HGB A1C): HEMOGLOBIN A1C: 8.2

## 2016-04-24 MED ORDER — INSULIN DETEMIR 100 UNIT/ML FLEXPEN
PEN_INJECTOR | SUBCUTANEOUS | 5 refills | Status: DC
Start: 2016-04-24 — End: 2016-08-25

## 2016-04-24 NOTE — Patient Instructions (Signed)
Diabetes Mellitus and Food It is important for you to manage your blood sugar (glucose) level. Your blood glucose level can be greatly affected by what you eat. Eating healthier foods in the appropriate amounts throughout the day at about the same time each day will help you control your blood glucose level. It can also help slow or prevent worsening of your diabetes mellitus. Healthy eating may even help you improve the level of your blood pressure and reach or maintain a healthy weight. General recommendations for healthful eating and cooking habits include:  Eating meals and snacks regularly. Avoid going long periods of time without eating to lose weight.  Eating a diet that consists mainly of plant-based foods, such as fruits, vegetables, nuts, legumes, and whole grains.  Using low-heat cooking methods, such as baking, instead of high-heat cooking methods, such as deep frying.  Work with your dietitian to make sure you understand how to use the Nutrition Facts information on food labels. How can food affect me? Carbohydrates Carbohydrates affect your blood glucose level more than any other type of food. Your dietitian will help you determine how many carbohydrates to eat at each meal and teach you how to count carbohydrates. Counting carbohydrates is important to keep your blood glucose at a healthy level, especially if you are using insulin or taking certain medicines for diabetes mellitus. Alcohol Alcohol can cause sudden decreases in blood glucose (hypoglycemia), especially if you use insulin or take certain medicines for diabetes mellitus. Hypoglycemia can be a life-threatening condition. Symptoms of hypoglycemia (sleepiness, dizziness, and disorientation) are similar to symptoms of having too much alcohol. If your health care provider has given you approval to drink alcohol, do so in moderation and use the following guidelines:  Women should not have more than one drink per day, and men  should not have more than two drinks per day. One drink is equal to: ? 12 oz of beer. ? 5 oz of wine. ? 1 oz of hard liquor.  Do not drink on an empty stomach.  Keep yourself hydrated. Have water, diet soda, or unsweetened iced tea.  Regular soda, juice, and other mixers might contain a lot of carbohydrates and should be counted.  What foods are not recommended? As you make food choices, it is important to remember that all foods are not the same. Some foods have fewer nutrients per serving than other foods, even though they might have the same number of calories or carbohydrates. It is difficult to get your body what it needs when you eat foods with fewer nutrients. Examples of foods that you should avoid that are high in calories and carbohydrates but low in nutrients include:  Trans fats (most processed foods list trans fats on the Nutrition Facts label).  Regular soda.  Juice.  Candy.  Sweets, such as cake, pie, doughnuts, and cookies.  Fried foods.  What foods can I eat? Eat nutrient-rich foods, which will nourish your body and keep you healthy. The food you should eat also will depend on several factors, including:  The calories you need.  The medicines you take.  Your weight.  Your blood glucose level.  Your blood pressure level.  Your cholesterol level.  You should eat a variety of foods, including:  Protein. ? Lean cuts of meat. ? Proteins low in saturated fats, such as fish, egg whites, and beans. Avoid processed meats.  Fruits and vegetables. ? Fruits and vegetables that may help control blood glucose levels, such as apples,   mangoes, and yams.  Dairy products. ? Choose fat-free or low-fat dairy products, such as milk, yogurt, and cheese.  Grains, bread, pasta, and rice. ? Choose whole grain products, such as multigrain bread, whole oats, and brown rice. These foods may help control blood pressure.  Fats. ? Foods containing healthful fats, such as  nuts, avocado, olive oil, canola oil, and fish.  Does everyone with diabetes mellitus have the same meal plan? Because every person with diabetes mellitus is different, there is not one meal plan that works for everyone. It is very important that you meet with a dietitian who will help you create a meal plan that is just right for you. This information is not intended to replace advice given to you by your health care provider. Make sure you discuss any questions you have with your health care provider. Document Released: 09/26/2004 Document Revised: 06/07/2015 Document Reviewed: 11/26/2012 Elsevier Interactive Patient Education  2017 Elsevier Inc.  

## 2016-04-24 NOTE — Progress Notes (Signed)
   Subjective:    Patient ID: [name], female    DOB: [birth year], 66 y.o.   MRN: [id number]  Diabetes  She presents for her follow-up diabetic visit. She has type 2 diabetes mellitus. Pertinent negatives for hypoglycemia include no confusion. Pertinent negatives for diabetes include no chest pain, no fatigue, no polydipsia, no polyphagia and no weakness.  Long discussion held with patient today she is occasionally having low sugar spells early in the morning but she also admits to not always eating properly during the day does not do much in way of physical activity does take her medicines as directed. She denies any chest tightness pressure pain shortness breath denies any breathing issues   Patient states no concerns this visit.  Review of Systems  Constitutional: Negative for activity change, appetite change and fatigue.  HENT: Negative for congestion.   Respiratory: Negative for cough.   Cardiovascular: Negative for chest pain.  Gastrointestinal: Negative for abdominal pain.  Endocrine: Negative for polydipsia and polyphagia.  Neurological: Negative for weakness.  Psychiatric/Behavioral: Negative for confusion.       Objective:   Physical Exam  Lungs clear heart regular pulse normal extremities no edema skin warm dry      Assessment & Plan:  Hyperlipidemia previous labs reviewed check lab work High risk medicine check metabolic 7 Diabetes control not as good as what I like to see recheck again in 4 months watch diet better becoming more active reduce insulin to 36 units to try to minimize risk of low sugar spells

## 2016-04-30 ENCOUNTER — Encounter: Payer: Self-pay | Admitting: Family Medicine

## 2016-04-30 ENCOUNTER — Ambulatory Visit (INDEPENDENT_AMBULATORY_CARE_PROVIDER_SITE_OTHER): Payer: Medicare Other | Admitting: Family Medicine

## 2016-04-30 VITALS — BP 136/64 | Temp 98.4°F | Wt 315.0 lb

## 2016-04-30 DIAGNOSIS — R51 Headache: Secondary | ICD-10-CM | POA: Diagnosis not present

## 2016-04-30 DIAGNOSIS — R519 Headache, unspecified: Secondary | ICD-10-CM

## 2016-04-30 DIAGNOSIS — E119 Type 2 diabetes mellitus without complications: Secondary | ICD-10-CM | POA: Diagnosis not present

## 2016-04-30 DIAGNOSIS — E785 Hyperlipidemia, unspecified: Secondary | ICD-10-CM | POA: Diagnosis not present

## 2016-04-30 MED ORDER — GABAPENTIN 100 MG PO CAPS: 100.0000 mg | ORAL_CAPSULE | Freq: Three times a day (TID) | ORAL | 5 refills | Status: DC

## 2016-04-30 MED ORDER — TRAMADOL HCL 50 MG PO TABS: 50.0000 mg | ORAL_TABLET | Freq: Two times a day (BID) | ORAL | 0 refills | Status: DC | PRN

## 2016-04-30 MED ORDER — POTASSIUM CHLORIDE ER 10 MEQ PO TBCR: 10.0000 meq | EXTENDED_RELEASE_TABLET | Freq: Every morning | ORAL | 1 refills | Status: DC

## 2016-04-30 MED ORDER — FUROSEMIDE 20 MG PO TABS: 20.0000 mg | ORAL_TABLET | Freq: Every morning | ORAL | 0 refills | Status: DC

## 2016-04-30 NOTE — Progress Notes (Signed)
   Subjective:    Patient ID: [name], female    DOB: 10-16-1950, 66 y.o.   MRN: [id number]  Headache   This is a new problem. The current episode started in the past 7 days. The pain is located in the frontal region. Associated symptoms include eye pain.   Patient with moderate headache over the past couple days but has had some mild headaches off and on for about a week and a half the headache radiates into the temporal region does not cause blurred vision does not wake her up at night no vomiting. Did denies it is been worse headache of her life. She is not had headaches in the past typically. Patient states no there concerns this visit.  Review of Systems  Eyes: Positive for pain.  Neurological: Positive for headaches.       Objective:   Physical Exam  Lungs clear hearts regular HEENT is benign extremities no edema skin warm dry Subjective tenderness in the temporal region although not severe neurologic grossly normal     Assessment & Plan:  Temporal headache check sedimentation rate use tramadol follow-up in one week to see how things go and if doing worse we will need to do CAT scan or MRI follow-up sooner if problems

## 2016-05-01 DIAGNOSIS — R51 Headache: Secondary | ICD-10-CM | POA: Diagnosis not present

## 2016-05-01 LAB — BASIC METABOLIC PANEL
BUN / CREAT RATIO: 12 (ref 12–28)
BUN: 11 mg/dL (ref 8–27)
CALCIUM: 9.9 mg/dL (ref 8.7–10.3)
CO2: 27 mmol/L (ref 18–29)
CREATININE: 0.91 mg/dL (ref 0.57–1.00)
Chloride: 97 mmol/L (ref 96–106)
GFR calc Af Amer: 77 mL/min/{1.73_m2} (ref >59)
GFR calc non Af Amer: 66 mL/min/{1.73_m2} (ref >59)
GLUCOSE: 152 mg/dL — AB (ref 65–99)
Potassium: 4.6 mmol/L (ref 3.5–5.2)
Sodium: 138 mmol/L (ref 134–144)

## 2016-05-01 LAB — LIPID PANEL
Chol/HDL Ratio: 5.4 ratio — ABNORMAL HIGH (ref 0.0–4.4)
Cholesterol, Total: 221 mg/dL — ABNORMAL HIGH (ref 100–199)
HDL: 41 mg/dL (ref >39)
LDL Calculated: 151 mg/dL — ABNORMAL HIGH (ref 0–99)
TRIGLYCERIDES: 143 mg/dL (ref 0–149)
VLDL Cholesterol Cal: 29 mg/dL (ref 5–40)

## 2016-05-02 LAB — SEDIMENTATION RATE: SED RATE: 17 mm/h (ref 0–40)

## 2016-05-21 NOTE — Addendum Note (Signed)
Addended by: Dairl Ponder on: 05/21/2016 04:52 PM   Modules accepted: Orders

## 2016-06-06 ENCOUNTER — Telehealth: Payer: Self-pay | Admitting: Family Medicine

## 2016-06-06 NOTE — Telephone Encounter (Signed)
Pt dropped off a glucose log. In red folder in office.

## 2016-06-09 ENCOUNTER — Encounter: Payer: Self-pay | Admitting: Family Medicine

## 2016-06-09 NOTE — Telephone Encounter (Signed)
Glucose readings look better Patient is trying harder will follow her up in August I did send a letter to her encouraging

## 2016-06-17 ENCOUNTER — Other Ambulatory Visit: Payer: Self-pay | Admitting: Family Medicine

## 2016-06-17 DIAGNOSIS — Z1231 Encounter for screening mammogram for malignant neoplasm of breast: Secondary | ICD-10-CM

## 2016-07-31 ENCOUNTER — Ambulatory Visit (HOSPITAL_COMMUNITY)
Admission: RE | Admit: 2016-07-31 | Discharge: 2016-07-31 | Disposition: A | Discharge status: Home / Self Care | Payer: Medicare Other | Source: Ambulatory Visit | Attending: Family Medicine | Admitting: Family Medicine

## 2016-07-31 DIAGNOSIS — Z1231 Encounter for screening mammogram for malignant neoplasm of breast: Secondary | ICD-10-CM | POA: Insufficient documentation

## 2016-08-05 ENCOUNTER — Other Ambulatory Visit: Payer: Self-pay

## 2016-08-25 ENCOUNTER — Encounter: Payer: Self-pay | Admitting: Family Medicine

## 2016-08-25 ENCOUNTER — Ambulatory Visit (INDEPENDENT_AMBULATORY_CARE_PROVIDER_SITE_OTHER): Payer: Medicare Other | Admitting: Family Medicine

## 2016-08-25 VITALS — BP 130/74 | Ht 67.0 in | Wt 312.0 lb

## 2016-08-25 DIAGNOSIS — E119 Type 2 diabetes mellitus without complications: Secondary | ICD-10-CM | POA: Diagnosis not present

## 2016-08-25 DIAGNOSIS — E784 Other hyperlipidemia: Secondary | ICD-10-CM | POA: Diagnosis not present

## 2016-08-25 DIAGNOSIS — Z79899 Other long term (current) drug therapy: Secondary | ICD-10-CM

## 2016-08-25 DIAGNOSIS — I10 Essential (primary) hypertension: Secondary | ICD-10-CM | POA: Diagnosis not present

## 2016-08-25 DIAGNOSIS — E7849 Other hyperlipidemia: Secondary | ICD-10-CM

## 2016-08-25 LAB — POCT GLYCOSYLATED HEMOGLOBIN (HGB A1C): HEMOGLOBIN A1C: 7.2

## 2016-08-25 MED ORDER — ATORVASTATIN CALCIUM 80 MG PO TABS: 80.0000 mg | ORAL_TABLET | Freq: Every day | ORAL | 1 refills | Status: DC

## 2016-08-25 MED ORDER — GABAPENTIN 100 MG PO CAPS: 100.0000 mg | ORAL_CAPSULE | Freq: Three times a day (TID) | ORAL | 5 refills | Status: DC

## 2016-08-25 MED ORDER — FUROSEMIDE 20 MG PO TABS: 20.0000 mg | ORAL_TABLET | Freq: Every morning | ORAL | 1 refills | Status: DC

## 2016-08-25 MED ORDER — INSULIN DETEMIR 100 UNIT/ML FLEXPEN: PEN_INJECTOR | SUBCUTANEOUS | 5 refills | Status: DC

## 2016-08-25 MED ORDER — METFORMIN HCL 500 MG PO TABS: 500.0000 mg | ORAL_TABLET | Freq: Two times a day (BID) | ORAL | 1 refills | Status: DC

## 2016-08-25 MED ORDER — POTASSIUM CHLORIDE ER 10 MEQ PO TBCR: 10.0000 meq | EXTENDED_RELEASE_TABLET | Freq: Every morning | ORAL | 1 refills | Status: DC

## 2016-08-25 NOTE — Patient Instructions (Signed)
Stop glipizide  Record reading s and send to [place] in 2 to 3 weeks

## 2016-08-25 NOTE — Progress Notes (Signed)
   Subjective:    Patient ID: [name], female    DOB: 1950-09-22, 66 y.o.   MRN: [id number]  Diabetes  She presents for her follow-up diabetic visit. She has type 2 diabetes mellitus. Pertinent negatives for hypoglycemia include no confusion. Pertinent negatives for diabetes include no chest pain, no fatigue, no polydipsia, no polyphagia and no weakness. Current diabetic treatments: on glipizide,levemir,metformin. She does not see a podiatrist.Eye exam is current.  Patient denies any depression States she takes her medicine on a regular basis She has chronic back issues with sciatica uses gabapentin it does help her She has chronic fluid swelling issues in her legs does well with taking the medicine  Takes her diabetes medicine without having any significant issues except for having a couple low sugar spells A1c good control Takes her cholesterol medicine regular basis. In her 81 mg aspirin no side effects States she eats healthy and exercises. Results for orders placed or performed in visit on 08/25/16  POCT glycosylated hemoglobin (Hb A1C)  Result Value Ref Range   Hemoglobin A1C 7.2     Review of Systems  Constitutional: Negative for activity change, appetite change and fatigue.  HENT: Negative for congestion.   Respiratory: Negative for cough.   Cardiovascular: Negative for chest pain.  Gastrointestinal: Positive for abdominal pain. Negative for blood in stool, constipation, diarrhea, nausea, rectal pain and vomiting.  Endocrine: Negative for polydipsia and polyphagia.  Musculoskeletal: Positive for back pain.  Neurological: Negative for weakness.  Psychiatric/Behavioral: Negative for confusion.       Objective:   Physical Exam  Constitutional: She appears well-nourished. No distress.  Cardiovascular: Normal rate, regular rhythm and normal heart sounds.   No murmur heard. Pulmonary/Chest: Effort normal and breath sounds normal. No respiratory distress.    Musculoskeletal: She exhibits no edema.  Lymphadenopathy:    She has no cervical adenopathy.  Neurological: She is alert. She exhibits normal muscle tone.  Psychiatric: Her behavior is normal.  Vitals reviewed.         Assessment & Plan:  Stopped glipizideDue to low sugar spells Diabetes good control continue current measures additional lab work ordered  Hyperlipidemia previous labs reviewed continue current medication ordered Labs  Blood pressure good control continue current measures watch diet  Morbid obesity Back on calories increase activity try to lose weight  Follow-up 6 months

## 2016-09-29 DIAGNOSIS — E119 Type 2 diabetes mellitus without complications: Secondary | ICD-10-CM | POA: Diagnosis not present

## 2016-09-29 DIAGNOSIS — Z79899 Other long term (current) drug therapy: Secondary | ICD-10-CM | POA: Diagnosis not present

## 2016-09-29 DIAGNOSIS — E784 Other hyperlipidemia: Secondary | ICD-10-CM | POA: Diagnosis not present

## 2016-09-30 ENCOUNTER — Telehealth: Payer: Self-pay | Admitting: Family Medicine

## 2016-09-30 NOTE — Telephone Encounter (Signed)
Patient dropped off blood glucose records for you to review in red folder.

## 2016-10-01 LAB — HEPATIC FUNCTION PANEL
ALBUMIN: 4.3 g/dL (ref 3.6–4.8)
ALT: 22 IU/L (ref 0–32)
AST: 19 IU/L (ref 0–40)
Alkaline Phosphatase: 172 IU/L — ABNORMAL HIGH (ref 39–117)
BILIRUBIN, DIRECT: 0.1 mg/dL (ref 0.00–0.40)
Bilirubin Total: 0.3 mg/dL (ref 0.0–1.2)
TOTAL PROTEIN: 7.6 g/dL (ref 6.0–8.5)

## 2016-10-01 LAB — LIPID PANEL
CHOLESTEROL TOTAL: 198 mg/dL (ref 100–199)
Chol/HDL Ratio: 4.1 ratio (ref 0.0–4.4)
HDL: 48 mg/dL (ref >39)
LDL Calculated: 128 mg/dL — ABNORMAL HIGH (ref 0–99)
Triglycerides: 110 mg/dL (ref 0–149)
VLDL Cholesterol Cal: 22 mg/dL (ref 5–40)

## 2016-10-01 LAB — MICROALBUMIN / CREATININE URINE RATIO
CREATININE, UR: 118.5 mg/dL
MICROALBUM., U, RANDOM: 14.8 ug/mL
Microalb/Creat Ratio: 12.5 mg/g creat (ref 0.0–30.0)

## 2016-10-02 NOTE — Telephone Encounter (Signed)
I reviewed over her glucose readings. Please confirm with the patient the amount of Levemir she is using. Also according to my records she is not on short acting insulin. Please verify this with the patient. She may need to be on short acting insulin with meals. Finally talk with the patient asking her typically how many meals that she eat per day thank you

## 2016-10-03 NOTE — Telephone Encounter (Signed)
Left message return call 10/03/16

## 2016-10-07 NOTE — Telephone Encounter (Signed)
Left message to return call - also see result note 

## 2016-10-16 DIAGNOSIS — E119 Type 2 diabetes mellitus without complications: Secondary | ICD-10-CM | POA: Diagnosis not present

## 2016-10-16 DIAGNOSIS — H25813 Combined forms of age-related cataract, bilateral: Secondary | ICD-10-CM | POA: Diagnosis not present

## 2016-10-16 DIAGNOSIS — H3540 Unspecified peripheral retinal degeneration: Secondary | ICD-10-CM | POA: Diagnosis not present

## 2016-10-16 LAB — HM DIABETES EYE EXAM

## 2016-10-17 MED ORDER — ROSUVASTATIN CALCIUM 40 MG PO TABS: 40.0000 mg | ORAL_TABLET | Freq: Every day | ORAL | 5 refills | Status: DC

## 2016-10-17 NOTE — Telephone Encounter (Signed)
Left message to r/c. Also message in Result notes for her aswell. 10/17/2016

## 2016-10-17 NOTE — Addendum Note (Signed)
Addended by: Karle Barr on: 10/17/2016 02:47 PM   Modules accepted: Orders

## 2016-10-21 NOTE — Telephone Encounter (Signed)
I called and left a voice message asked the pt to r/c.

## 2016-10-21 NOTE — Telephone Encounter (Signed)
I mailed a green card for the pt to r/c.

## 2016-10-24 ENCOUNTER — Encounter: Payer: Self-pay | Admitting: *Deleted

## 2016-11-05 ENCOUNTER — Ambulatory Visit: Payer: Medicare Other | Admitting: Orthopedic Surgery

## 2016-11-05 ENCOUNTER — Other Ambulatory Visit: Payer: Medicare Other

## 2016-11-06 ENCOUNTER — Encounter: Payer: Self-pay | Admitting: Orthopedic Surgery

## 2016-11-10 NOTE — Telephone Encounter (Signed)
Information noncritical, patient has follow-up office visit in February, last A1c in August was good.  No need to send further communication

## 2016-11-20 NOTE — Progress Notes (Signed)
Visit canceled

## 2016-12-08 ENCOUNTER — Ambulatory Visit: Payer: Medicare Other | Admitting: Orthopedic Surgery

## 2016-12-09 ENCOUNTER — Encounter: Payer: Self-pay | Admitting: Orthopedic Surgery

## 2016-12-09 ENCOUNTER — Ambulatory Visit (INDEPENDENT_AMBULATORY_CARE_PROVIDER_SITE_OTHER): Payer: Medicare Other

## 2016-12-09 ENCOUNTER — Ambulatory Visit (INDEPENDENT_AMBULATORY_CARE_PROVIDER_SITE_OTHER): Payer: Medicare Other | Admitting: Orthopedic Surgery

## 2016-12-09 VITALS — BP 130/72 | HR 75 | Ht 67.0 in | Wt 305.0 lb

## 2016-12-09 DIAGNOSIS — Z96652 Presence of left artificial knee joint: Secondary | ICD-10-CM

## 2016-12-09 NOTE — Progress Notes (Signed)
Progress Note   Patient ID: [name], female   DOB: Jan 03, 1951, 66 y.o.   MRN: [id number]  Chief Complaint  Patient presents with  . Post-op Follow-up    left knee 09/02/10     66 year old female annual total knee follow-up left knee 6 years out no complaints     Review of Systems  Musculoskeletal: Negative for joint pain.  All other systems reviewed and are negative.  Current Meds  Medication Sig  . aspirin 81 MG tablet Take 81 mg by mouth daily.  . blood glucose meter kit and supplies KIT Dispense based on patient and insurance preference. Use up to four times daily as directed. (FOR ICD-9 250.00, 250.01).  . Flaxseed, Linseed, (FLAX SEED OIL PO) Take 3 capsules by mouth daily.   . furosemide (LASIX) 20 MG tablet Take 1 tablet (20 mg total) by mouth every morning.  . gabapentin (NEURONTIN) 100 MG capsule Take 1 capsule (100 mg total) by mouth 3 (three) times daily.  Marland Kitchen glucose blood (EASYMAX TEST) test strip USE TO TEST TWICE DAILY.  Marland Kitchen Insulin Detemir (LEVEMIR FLEXTOUCH) 100 UNIT/ML Pen INJECT 36 UNITS SUBCUTANEOUSLY AFTER BREAKFAST  . Insulin Pen Needle (ULTICARE MICRO PEN NEEDLES) 32G X 4 MM MISC USE AS DIRECTED Dx.Ell.9  . Lancets (STERILANCE TL) MISC USE TO TEST TWICE DAILY.  . metFORMIN (GLUCOPHAGE) 500 MG tablet Take 1 tablet (500 mg total) by mouth 2 (two) times daily with a meal.  . potassium chloride (K-DUR) 10 MEQ tablet Take 1 tablet (10 mEq total) by mouth every morning.  . rosuvastatin (CRESTOR) 40 MG tablet Take 1 tablet (40 mg total) by mouth daily.  . [DISCONTINUED] furosemide (LASIX) 20 MG tablet Take 1 tablet (20 mg total) by mouth every morning.    Allergies  Allergen Reactions  . Hydrocodone Itching     BP 130/72   Pulse 75   Ht 5' 7"  (1.702 m)   Wt (!) 305 lb (138.3 kg)   BMI 47.77 kg/m   Physical Exam  Constitutional: She is oriented to person, place, and time. She appears well-developed and well-nourished.  Musculoskeletal:       Left  knee: She exhibits normal range of motion, no swelling, no effusion, no ecchymosis, no deformity, no laceration, no erythema, normal alignment, no LCL laxity, normal patellar mobility, no bony tenderness, normal meniscus and no MCL laxity. No tenderness found. No medial joint line, no lateral joint line, no MCL, no LCL and no patellar tendon tenderness noted.       Legs: Neurological: She is alert and oriented to person, place, and time.  Psychiatric: She has a normal mood and affect. Judgment normal.  Vitals reviewed.    Medical decision-making Encounter Diagnosis  Name Primary?  . Status post total left knee replacement Yes    Radiology report  3 views left knee  AP lateral and patellar sunrise views of the knee  FINDINGS: A total knee prosthesis is noted. It is in anatomic alignment. There is no evidence of loosening.  Impression : normal TKA  xray   No orders of the defined types were placed in this encounter.    Arther Abbott, MD 12/09/2016 11:12 AM

## 2016-12-16 ENCOUNTER — Other Ambulatory Visit: Payer: Self-pay | Admitting: Family Medicine

## 2016-12-29 ENCOUNTER — Other Ambulatory Visit: Payer: Self-pay | Admitting: Family Medicine

## 2017-01-01 ENCOUNTER — Other Ambulatory Visit: Payer: Self-pay | Admitting: Nurse Practitioner

## 2017-01-01 ENCOUNTER — Telehealth: Payer: Self-pay | Admitting: Family Medicine

## 2017-01-01 MED ORDER — POTASSIUM CHLORIDE ER 10 MEQ PO TBCR: 10.0000 meq | EXTENDED_RELEASE_TABLET | Freq: Every morning | ORAL | 1 refills | Status: DC

## 2017-01-01 MED ORDER — GABAPENTIN 100 MG PO CAPS: 100.0000 mg | ORAL_CAPSULE | Freq: Three times a day (TID) | ORAL | 5 refills | Status: DC

## 2017-01-01 MED ORDER — FUROSEMIDE 20 MG PO TABS
20.0000 mg | ORAL_TABLET | Freq: Every morning | ORAL | 1 refills | Status: DC
Start: 2017-01-01 — End: 2017-05-11

## 2017-01-01 MED ORDER — METFORMIN HCL 500 MG PO TABS: 500.0000 mg | ORAL_TABLET | Freq: Two times a day (BID) | ORAL | 1 refills | Status: DC

## 2017-01-01 NOTE — Telephone Encounter (Signed)
Pt is needing refills on  furosemide (LASIX) 20 MG tablet gabapentin (NEURONTIN) 100 MG capsule metFORMIN (GLUCOPHAGE) 500 MG tablet  potassium chloride (K-DUR) 10 MEQ tablet  [facility] IS NOT DUE TO BE SEEN AGAIN TILL FEB.

## 2017-01-01 NOTE — Telephone Encounter (Signed)
Done

## 2017-01-07 ENCOUNTER — Encounter: Payer: Self-pay | Admitting: Family Medicine

## 2017-02-25 ENCOUNTER — Ambulatory Visit: Payer: Medicare Other | Admitting: Family Medicine

## 2017-05-11 ENCOUNTER — Ambulatory Visit (INDEPENDENT_AMBULATORY_CARE_PROVIDER_SITE_OTHER): Payer: Medicare Other | Admitting: Family Medicine

## 2017-05-11 ENCOUNTER — Encounter: Payer: Self-pay | Admitting: Family Medicine

## 2017-05-11 VITALS — BP 130/72 | Temp 98.3°F | Ht 67.0 in | Wt 298.0 lb

## 2017-05-11 DIAGNOSIS — I10 Essential (primary) hypertension: Secondary | ICD-10-CM

## 2017-05-11 DIAGNOSIS — E7849 Other hyperlipidemia: Secondary | ICD-10-CM | POA: Diagnosis not present

## 2017-05-11 DIAGNOSIS — Z79899 Other long term (current) drug therapy: Secondary | ICD-10-CM

## 2017-05-11 DIAGNOSIS — J019 Acute sinusitis, unspecified: Secondary | ICD-10-CM

## 2017-05-11 DIAGNOSIS — E119 Type 2 diabetes mellitus without complications: Secondary | ICD-10-CM

## 2017-05-11 MED ORDER — GABAPENTIN 100 MG PO CAPS: 100.0000 mg | ORAL_CAPSULE | Freq: Three times a day (TID) | ORAL | 1 refills | Status: DC

## 2017-05-11 MED ORDER — ROSUVASTATIN CALCIUM 40 MG PO TABS: 40.0000 mg | ORAL_TABLET | Freq: Every day | ORAL | 1 refills | Status: DC

## 2017-05-11 MED ORDER — FUROSEMIDE 20 MG PO TABS: ORAL_TABLET | ORAL | 1 refills | Status: DC

## 2017-05-11 MED ORDER — AMOXICILLIN 500 MG PO TABS: 500.0000 mg | ORAL_TABLET | Freq: Three times a day (TID) | ORAL | 0 refills | Status: DC

## 2017-05-11 MED ORDER — METFORMIN HCL 500 MG PO TABS: 500.0000 mg | ORAL_TABLET | Freq: Two times a day (BID) | ORAL | 1 refills | Status: DC

## 2017-05-11 MED ORDER — POTASSIUM CHLORIDE ER 10 MEQ PO TBCR: EXTENDED_RELEASE_TABLET | ORAL | 1 refills | Status: DC

## 2017-05-11 MED ORDER — INSULIN DETEMIR 100 UNIT/ML FLEXPEN: PEN_INJECTOR | SUBCUTANEOUS | 5 refills | Status: DC

## 2017-05-11 NOTE — Progress Notes (Signed)
Subjective:    Patient ID: [name], female    DOB: 04-Jul-1950, 67 y.o.   MRN: [id number]  HPI Patient is here today to follow up on a ongoing cold she has had since Friday after returning from [facility].She is sneezing,productive cough,sore throat. No fevers.She has been taking Robitussin Dm and cough drops.  She relates a lot of head congestion drainage coughing no wheezing or difficulty breathing denies high fever chills  She is also complaining of shock pains in the top and bottom of feet that run up her legs ongoing for some time now. Intermittent sharp pains on the top of her feet radiate up the leg no weakness or numbness  Patient for blood pressure check up. Patient relates compliance with meds. Todays BP reviewed with the patient. Patient denies issues with medication. Patient relates reasonable diet. Patient tries to minimize salt. Patient aware of BP goals.  The patient was seen today as part of a comprehensive diabetic check up.The patient relates medication compliance. No significant side effects to the medications. Denies any low glucose spells. Relates compliance with diet to a reasonable level. Patient does do labwork intermittently and understands the dangers of diabetes.  Patient here for follow-up regarding cholesterol.  Patient does try to maintain a reasonable diet.  Patient does take the medication on a regular basis.  Denies missing a dose.  The patient denies any obvious side effects.  Prior blood work results reviewed with the patient.  The patient is aware of his cholesterol goals and the need to keep it under good control to lessen the risk of disease.  Patient is trying to lose weight she is trying to watch how she eats difficult for her to lose weight because of her age and ability to exercise because of orthopedic issues  The patient's BMI is calculated.  It is in the vital signs and acknowledged.  It is above the recommended BMI for the patient's height and weight.   The patient has been counseled regarding healthy diet, restricted portions, avoiding excessive carbohydrates/sugary foods, and increase physical activity as health permits.  It is in the patient's best interest to lower the risk of secondary illness including heart disease strokes and cancer by losing weight.  The patient acknowledges this information.   Review of Systems  Constitutional: Negative for activity change, appetite change and fatigue.  HENT: Negative for congestion.   Respiratory: Negative for cough.   Cardiovascular: Negative for chest pain.  Gastrointestinal: Negative for abdominal pain.  Endocrine: Negative for polydipsia and polyphagia.  Skin: Negative for color change.  Neurological: Negative for weakness.  Psychiatric/Behavioral: Negative for confusion.       Objective:   Physical Exam  Constitutional: She appears well-nourished. No distress.  Cardiovascular: Normal rate, regular rhythm and normal heart sounds.  No murmur heard. Pulmonary/Chest: Effort normal and breath sounds normal. No respiratory distress.  Musculoskeletal: She exhibits no edema.  Lymphadenopathy:    She has no cervical adenopathy.  Neurological: She is alert. She exhibits normal muscle tone.  Psychiatric: Her behavior is normal.  Vitals reviewed.         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  This statement verifies that 25 minutes was indeed  spent with the patient. Greater than half the time was spent in discussion, counseling and answering questions  regarding the issues that the patient came in for today as reflected in the diagnosis (s) please refer to documentation for further details. Discussing sinusitis neuropathy diabetes hypertension hyperlipidemia

## 2017-05-12 LAB — BASIC METABOLIC PANEL
BUN/Creatinine Ratio: 11 — ABNORMAL LOW (ref 12–28)
BUN: 9 mg/dL (ref 8–27)
CALCIUM: 9.4 mg/dL (ref 8.7–10.3)
CO2: 25 mmol/L (ref 20–29)
CREATININE: 0.82 mg/dL (ref 0.57–1.00)
Chloride: 100 mmol/L (ref 96–106)
GFR calc Af Amer: 86 mL/min/{1.73_m2} (ref >59)
GFR, EST NON AFRICAN AMERICAN: 75 mL/min/{1.73_m2} (ref >59)
GLUCOSE: 266 mg/dL — AB (ref 65–99)
POTASSIUM: 4.3 mmol/L (ref 3.5–5.2)
SODIUM: 137 mmol/L (ref 134–144)

## 2017-05-12 LAB — LIPID PANEL
Chol/HDL Ratio: 4.2 ratio (ref 0.0–4.4)
Cholesterol, Total: 196 mg/dL (ref 100–199)
HDL: 47 mg/dL (ref >39)
LDL Calculated: 131 mg/dL — ABNORMAL HIGH (ref 0–99)
Triglycerides: 88 mg/dL (ref 0–149)
VLDL CHOLESTEROL CAL: 18 mg/dL (ref 5–40)

## 2017-05-12 LAB — HEPATIC FUNCTION PANEL
ALBUMIN: 4.1 g/dL (ref 3.6–4.8)
ALK PHOS: 176 IU/L — AB (ref 39–117)
ALT: 27 IU/L (ref 0–32)
AST: 19 IU/L (ref 0–40)
Bilirubin Total: 0.3 mg/dL (ref 0.0–1.2)
Bilirubin, Direct: 0.1 mg/dL (ref 0.00–0.40)
TOTAL PROTEIN: 7.3 g/dL (ref 6.0–8.5)

## 2017-05-12 LAB — HEMOGLOBIN A1C
ESTIMATED AVERAGE GLUCOSE: 286 mg/dL
Hgb A1c MFr Bld: 11.6 % — ABNORMAL HIGH (ref 4.8–5.6)

## 2017-06-12 ENCOUNTER — Emergency Department (HOSPITAL_COMMUNITY)
Admission: EM | Admit: 2017-06-12 | Discharge: 2017-06-12 | Disposition: A | Discharge status: Home / Self Care | Payer: Medicare Other | Attending: Emergency Medicine | Admitting: Emergency Medicine

## 2017-06-12 ENCOUNTER — Other Ambulatory Visit: Payer: Self-pay

## 2017-06-12 ENCOUNTER — Encounter (HOSPITAL_COMMUNITY): Payer: Self-pay | Admitting: *Deleted

## 2017-06-12 DIAGNOSIS — E7800 Pure hypercholesterolemia, unspecified: Secondary | ICD-10-CM | POA: Insufficient documentation

## 2017-06-12 DIAGNOSIS — Z79899 Other long term (current) drug therapy: Secondary | ICD-10-CM | POA: Diagnosis not present

## 2017-06-12 DIAGNOSIS — K0889 Other specified disorders of teeth and supporting structures: Secondary | ICD-10-CM | POA: Diagnosis present

## 2017-06-12 DIAGNOSIS — I10 Essential (primary) hypertension: Secondary | ICD-10-CM | POA: Diagnosis not present

## 2017-06-12 DIAGNOSIS — R519 Headache, unspecified: Secondary | ICD-10-CM

## 2017-06-12 DIAGNOSIS — E119 Type 2 diabetes mellitus without complications: Secondary | ICD-10-CM | POA: Insufficient documentation

## 2017-06-12 DIAGNOSIS — Z794 Long term (current) use of insulin: Secondary | ICD-10-CM | POA: Insufficient documentation

## 2017-06-12 DIAGNOSIS — E785 Hyperlipidemia, unspecified: Secondary | ICD-10-CM | POA: Diagnosis not present

## 2017-06-12 DIAGNOSIS — R51 Headache: Secondary | ICD-10-CM | POA: Insufficient documentation

## 2017-06-12 DIAGNOSIS — Z7982 Long term (current) use of aspirin: Secondary | ICD-10-CM | POA: Diagnosis not present

## 2017-06-12 MED ORDER — OXYCODONE-ACETAMINOPHEN 5-325 MG PO TABS: 1.0000 | ORAL_TABLET | Freq: Three times a day (TID) | ORAL | 0 refills | Status: DC | PRN

## 2017-06-12 NOTE — ED Triage Notes (Signed)
Pt c/o right side dental pain that started 4 days ago, pt has appointment with dentist next week but states that the pain has gotten worse,
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
## 2017-06-26 ENCOUNTER — Other Ambulatory Visit: Payer: Self-pay | Admitting: Family Medicine

## 2017-06-26 DIAGNOSIS — Z1231 Encounter for screening mammogram for malignant neoplasm of breast: Secondary | ICD-10-CM

## 2017-06-29 ENCOUNTER — Telehealth: Payer: Self-pay | Admitting: Orthopedic Surgery

## 2017-06-29 NOTE — Telephone Encounter (Signed)
Patient called to relay she has had an onset of pain in her left knee, radiating to left hip; has history of both total knee and total hip replacements. Scheduled for first available, and added to wait list. Will also call primary care doctor.

## 2017-06-30 ENCOUNTER — Ambulatory Visit (INDEPENDENT_AMBULATORY_CARE_PROVIDER_SITE_OTHER): Payer: Medicare Other | Admitting: Family Medicine

## 2017-06-30 ENCOUNTER — Encounter: Payer: Self-pay | Admitting: Family Medicine

## 2017-06-30 VITALS — BP 128/70 | Ht 67.0 in | Wt 294.0 lb

## 2017-06-30 DIAGNOSIS — M5432 Sciatica, left side: Secondary | ICD-10-CM

## 2017-06-30 MED ORDER — NAPROXEN 500 MG PO TABS: ORAL_TABLET | ORAL | 0 refills | Status: DC

## 2017-06-30 NOTE — Progress Notes (Signed)
   Subjective:    Patient ID: [name], female    DOB: [birth year], 67 y.o.   MRN: [id number]  HPIleft hip pain. Off and on for over one month but worse yesterday. Has not tried any treatments.   Left knee pain. Off and on for over one month but worse yesterday. Has not tried any treatments.   Patient relates significant hip pain that radiates to the left knee she has tried some Tylenol it really seem to help she denies there but was supposed history of sleep issues she denies numbness tingling  Review of Systems    No loss of bowel or bladder control she walks with a cane she relates moderate pain in the in the knee Objective:   Physical Exam  Lungs clear respiratory rate normal heart is regular low back subjective discomfort significant hip discomfort negative straight leg raise osteoarthritis noted in the knees  It is possible that this could be arthritic condition as well but more than likely sciatica issues she has an appointment with Dr. Aline Brochure later this week    Assessment & Plan:  Left hip pain with probable sciatica to the left knee Continue gabapentin Short course anti-inflammatories See Dr. Aline Brochure for further evaluation May need hip x-rays as well

## 2017-07-03 ENCOUNTER — Encounter: Payer: Self-pay | Admitting: Orthopedic Surgery

## 2017-07-03 ENCOUNTER — Ambulatory Visit (INDEPENDENT_AMBULATORY_CARE_PROVIDER_SITE_OTHER): Payer: Medicare Other

## 2017-07-03 ENCOUNTER — Ambulatory Visit (INDEPENDENT_AMBULATORY_CARE_PROVIDER_SITE_OTHER): Payer: Medicare Other | Admitting: Orthopedic Surgery

## 2017-07-03 ENCOUNTER — Encounter

## 2017-07-03 VITALS — BP 108/70 | HR 78 | Ht 67.0 in | Wt 291.0 lb

## 2017-07-03 DIAGNOSIS — Z96642 Presence of left artificial hip joint: Secondary | ICD-10-CM

## 2017-07-03 DIAGNOSIS — M5410 Radiculopathy, site unspecified: Secondary | ICD-10-CM | POA: Diagnosis not present

## 2017-07-03 DIAGNOSIS — M25552 Pain in left hip: Secondary | ICD-10-CM

## 2017-07-03 DIAGNOSIS — Z96652 Presence of left artificial knee joint: Secondary | ICD-10-CM | POA: Diagnosis not present
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
## 2017-07-22 ENCOUNTER — Ambulatory Visit (HOSPITAL_COMMUNITY): Payer: Medicare Other | Attending: Orthopedic Surgery | Admitting: Physical Therapy

## 2017-07-22 ENCOUNTER — Ambulatory Visit (INDEPENDENT_AMBULATORY_CARE_PROVIDER_SITE_OTHER): Payer: Medicare Other | Admitting: Nurse Practitioner

## 2017-07-22 VITALS — BP 120/78 | Ht 67.0 in | Wt 291.0 lb

## 2017-07-22 DIAGNOSIS — N907 Vulvar cyst: Secondary | ICD-10-CM

## 2017-07-22 DIAGNOSIS — M25552 Pain in left hip: Secondary | ICD-10-CM | POA: Insufficient documentation

## 2017-07-22 NOTE — Therapy (Signed)
[facility] [place], [place], [address] Phone: [phone]   Fax:  [phone]  Patient Details  Name: [name] MRN: [id number] Date of Birth: 23-Apr-1950 Referring Provider:  Carole Civil, MD  Encounter Date: 07/22/2017   Patient reported that she went to stand up from the table last month and she stated she felt like she could not walk. She said her back was hurting and the pain went all the way down her left leg. Currently, patient stated that she is not having any pain. She said she is able to do everything that she wants to do. Therapist performed a screen of patient's lumbar ROM, which was [facility] and without reports of pain, although extension was more limited. Then therapist screened patient's lower extremity strength which was [facility] in all muscle groups. Patient denied tenderness to palpation in her low back where she reported she had previously had pain. Patient was wearing a skirt and therefore the therapist covered the patient with a sheet for modesty and asked patient if this was comfortable for her and patient said that it was. In addition, patient's bilateral hips and knees were within functional limits with assessment. Patient was negative for ankle clonus bilaterally. As patient stated she is not having any pain and has reported that she is able to do all of her usual activities, therapist did not perform a full evaluation at this time. Patient was informed that if she has any continued concerns she should call the clinic and was provided the information on the clinic for any future concerns. Screening occurred from 8:32-8:52 am.  Clarene Critchley PT, DPT 9:05 AM, 07/22/17 [place], [place], [address] Phone: [phone]   Fax:  [phone]

## 2017-07-24 ENCOUNTER — Encounter: Payer: Self-pay | Admitting: Nurse Practitioner

## 2017-07-24 NOTE — Progress Notes (Signed)
Subjective: Presents for complaints of a cyst just above her vagina first noticed 3 days ago.  Minimal itching.  Nontender.  No change in size.  No other lesions noted.  Objective:   BP 120/78   Ht 5\' 7"  (1.702 m)   Wt 291 lb (132 kg)   BMI 45.58 kg/m  NAD.  Alert, oriented.  External GU no rashes or lesions noted.  No cyst can be visualized but a very tiny linear mobile soft cyst noted at the top of the vulva midline.  Nontender.  No other lesions noted.  There appears to be skin changes consistent with some healed fissures in the past, question whether this could be some scar tissue.  Assessment:  Vulvar cyst    Plan: At this point the area is very tiny with no other worrisome features.  Patient to call back if the area increases in size or becomes tender.

## 2017-08-03 ENCOUNTER — Other Ambulatory Visit: Payer: Self-pay | Admitting: Family Medicine

## 2017-08-03 ENCOUNTER — Encounter (HOSPITAL_COMMUNITY): Payer: Self-pay

## 2017-08-03 ENCOUNTER — Ambulatory Visit (HOSPITAL_COMMUNITY)
Admission: RE | Admit: 2017-08-03 | Discharge: 2017-08-03 | Disposition: A | Discharge status: Home / Self Care | Payer: Medicare Other | Source: Ambulatory Visit | Attending: Family Medicine | Admitting: Family Medicine

## 2017-08-03 DIAGNOSIS — Z1231 Encounter for screening mammogram for malignant neoplasm of breast: Secondary | ICD-10-CM | POA: Diagnosis not present

## 2017-08-25 ENCOUNTER — Ambulatory Visit: Payer: Medicare Other | Admitting: Orthopedic Surgery

## 2017-09-19 ENCOUNTER — Other Ambulatory Visit: Payer: Self-pay | Admitting: Family Medicine

## 2017-09-21 NOTE — Telephone Encounter (Signed)
Nurses #1 talk with the patient #2 confirm medications #3 appears from the chart she is no longer on Lipitor she is on Crestor.  If so please call pharmacy let them know she is no longer on Lipitor.  #4 if she needs refills of Lasix she may given 90-day supply no refills she needs to keep her office visit in October if she is willing to do lab work send me a message and we will order labs

## 2017-09-22 ENCOUNTER — Other Ambulatory Visit: Payer: Self-pay

## 2017-09-22 MED ORDER — FUROSEMIDE 20 MG PO TABS: ORAL_TABLET | ORAL | 0 refills | Status: DC

## 2017-09-22 MED ORDER — INSULIN PEN NEEDLE 32G X 4 MM MISC: 11 refills | Status: DC

## 2017-09-22 NOTE — Telephone Encounter (Signed)
Patient is not sure if the medication was changed. I will call the pharmacy and clarify with them. I sent in refills on the lasix and the needles. Pt states she is willing to have labs drawn,please advise what you would like. When I call her back she will need to be transferred up front to schedule appt in Oct 2019.

## 2017-09-22 NOTE — Telephone Encounter (Signed)
I spoke to [place] at [facility]. He states the crestor was never picked up by pt and she had lipitor filled last in May # 90.He states he does not know why she never picked up as it is not a cost issue. Please advise to which medication she needs sent in. Marland Kitchen

## 2017-09-22 NOTE — Telephone Encounter (Signed)
I recommend stopping Lipitor I recommend starting Crestor 40 mg daily, #90, 1 refill Patient unfortunately noncompliant

## 2017-09-23 NOTE — Telephone Encounter (Signed)
Patient notified and verbalized understanding and will pick up crestor

## 2017-09-28 ENCOUNTER — Ambulatory Visit (INDEPENDENT_AMBULATORY_CARE_PROVIDER_SITE_OTHER): Payer: Medicare Other | Admitting: Orthopedic Surgery

## 2017-09-28 VITALS — BP 99/51 | HR 70 | Ht 67.0 in | Wt 290.0 lb

## 2017-09-28 DIAGNOSIS — Z6841 Body Mass Index (BMI) 40.0 and over, adult: Secondary | ICD-10-CM | POA: Diagnosis not present

## 2017-09-28 DIAGNOSIS — M5410 Radiculopathy, site unspecified: Secondary | ICD-10-CM | POA: Diagnosis not present

## 2017-09-28 DIAGNOSIS — Z96642 Presence of left artificial hip joint: Secondary | ICD-10-CM

## 2017-09-28 DIAGNOSIS — Z96652 Presence of left artificial knee joint: Secondary | ICD-10-CM | POA: Diagnosis not present

## 2017-09-28 NOTE — Progress Notes (Addendum)
Chief Complaint  Patient presents with  . Follow-up    Recheck on back.    67 year old female presented back for lower back pain.  She did not go to therapy  She says that her back hurts on the weekends not during the week  Review of systems red flag signs negative bowel bladder dysfunction saddle anesthesia fever weight loss  BP (!) 99/51   Pulse 70   Ht 5\' 7"  (1.702 m)   Wt 290 lb (131.5 kg)   BMI 45.42 kg/m   Awake alert oriented x3 Mood and affect normal  Gait unsupported slightly flexed posture  Mild tenderness medial and lateral lower back  Neurovascular intact  No radicular signs no weakness  Encounter Diagnoses  Name Primary?  . 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 S/P hip replacement, left 2008   . Radicular syndrome of left leg Yes  . Body mass index 45.0-49.9, adult ([place])   . Morbid obesity ([place])    The patient meets the AMA guidelines for Morbid (severe) obesity with a BMI > 40.0 and I have recommended weight loss.   Resume physical therapy return 4 to 6 weeks

## 2017-09-29 ENCOUNTER — Telehealth: Payer: Self-pay | Admitting: Radiology

## 2017-09-29 DIAGNOSIS — M5410 Radiculopathy, site unspecified: Secondary | ICD-10-CM

## 2017-09-29 NOTE — Telephone Encounter (Signed)
Amanda ([facility]) messaged me about this patient. She was here yesterday was told we will order PT, but I missed the order. I have put it in now and Estill Bamberg will call her to make the appointment .

## 2017-10-16 ENCOUNTER — Ambulatory Visit (HOSPITAL_COMMUNITY): Payer: Medicare Other | Attending: Orthopedic Surgery | Admitting: Physical Therapy

## 2017-10-16 ENCOUNTER — Encounter (HOSPITAL_COMMUNITY): Payer: Self-pay | Admitting: Physical Therapy

## 2017-10-16 ENCOUNTER — Other Ambulatory Visit: Payer: Self-pay

## 2017-10-16 DIAGNOSIS — R2689 Other abnormalities of gait and mobility: Secondary | ICD-10-CM | POA: Diagnosis not present

## 2017-10-16 DIAGNOSIS — R29898 Other symptoms and signs involving the musculoskeletal system: Secondary | ICD-10-CM | POA: Diagnosis not present

## 2017-10-16 DIAGNOSIS — M25552 Pain in left hip: Secondary | ICD-10-CM | POA: Insufficient documentation

## 2017-10-16 DIAGNOSIS — M6281 Muscle weakness (generalized): Secondary | ICD-10-CM | POA: Diagnosis not present
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
## 2017-10-19 ENCOUNTER — Ambulatory Visit (HOSPITAL_COMMUNITY): Payer: Medicare Other | Admitting: Physical Therapy

## 2017-10-19 ENCOUNTER — Encounter (HOSPITAL_COMMUNITY): Payer: Self-pay | Admitting: Physical Therapy

## 2017-10-19 DIAGNOSIS — R29898 Other symptoms and signs involving the musculoskeletal system: Secondary | ICD-10-CM

## 2017-10-19 DIAGNOSIS — M6281 Muscle weakness (generalized): Secondary | ICD-10-CM | POA: Diagnosis not present

## 2017-10-19 DIAGNOSIS — R2689 Other abnormalities of gait and mobility: Secondary | ICD-10-CM

## 2017-10-19 DIAGNOSIS — M25552 Pain in left hip: Secondary | ICD-10-CM | POA: Diagnosis not present

## 2017-10-19 NOTE — Patient Instructions (Addendum)
HIP: Hamstrings - Short Sitting    Rest leg on raised surface. Keep knee straight. Lift chest. Hold _30__ seconds. _3__ reps per set, __1_ sets per day, __7_ days per week  Copyright  VHI. All rights reserved.  HIP: Hamstrings - Short Sitting(Home) Flexion: Pelvic Tilt    Lie with neck supported, knees bent, feet flat. Tighten and suck stomach in, pushing back down against surface. Do not push down with legs. Hold for 5 seconds. Repeat __10__ times per set. Do _1___ sets per session. Do __7__ sessions per week.  Copyright  VHI. All rights reserved.  Bridge   Gluteal Sets    Squeeze pelvic floor and hold. Tighten bottom. Hold for _5__ seconds. Relax for _5__ seconds. Repeat _10__ times. Do _1__ times a day.   Copyright  VHI. All rights reserved.  Gluteal Sets
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
## 2017-10-21 ENCOUNTER — Ambulatory Visit (HOSPITAL_COMMUNITY): Payer: Medicare Other

## 2017-10-21 ENCOUNTER — Encounter (HOSPITAL_COMMUNITY): Payer: Self-pay

## 2017-10-21 DIAGNOSIS — M6281 Muscle weakness (generalized): Secondary | ICD-10-CM

## 2017-10-21 DIAGNOSIS — M25552 Pain in left hip: Secondary | ICD-10-CM | POA: Diagnosis not present

## 2017-10-21 DIAGNOSIS — R2689 Other abnormalities of gait and mobility: Secondary | ICD-10-CM | POA: Diagnosis not present

## 2017-10-21 DIAGNOSIS — R29898 Other symptoms and signs involving the musculoskeletal system: Secondary | ICD-10-CM | POA: Diagnosis not present
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
## 2017-10-22 ENCOUNTER — Ambulatory Visit (HOSPITAL_COMMUNITY): Payer: Medicare Other

## 2017-10-22 DIAGNOSIS — M25552 Pain in left hip: Secondary | ICD-10-CM

## 2017-10-22 DIAGNOSIS — M6281 Muscle weakness (generalized): Secondary | ICD-10-CM

## 2017-10-22 DIAGNOSIS — R2689 Other abnormalities of gait and mobility: Secondary | ICD-10-CM | POA: Diagnosis not present

## 2017-10-22 DIAGNOSIS — R29898 Other symptoms and signs involving the musculoskeletal system: Secondary | ICD-10-CM | POA: Diagnosis not present

## 2017-10-22 NOTE — Patient Instructions (Signed)
Isometric Abdominal    Lying on back with knees bent, tighten stomach by pressing elbows down. Hold ____ seconds. Repeat ____ times per set. Do ____ sets per session. Do ____ sessions per day.  http://orth.exer.us/1087   Copyright  VHI. All rights reserved.    Pelvic Tilt: Posterior - Legs Bent (Supine)    Tighten stomach and flatten back by rolling pelvis down. Hold ____ seconds. Relax. Repeat ____ times per set. Do ____ sets per session. Do ____ sessions per day.  http://orth.exer.us/203   Copyright  VHI. All rights reserved.   Small Ball Marching Supine    Lie supine with small ball under tailbone, knees flexed. Raise one leg a few inches. Hold briefly, return and raise other leg. Keep hips stationary. Do ___ sets of ___ repetitions.  Copyright  VHI. All rights reserved.   Prone Hip and Knee Extension    Try to lift operated leg, keeping knee as straight as possible. Do not lift or turn hips. Hold 2-3____ seconds. Repeat _10___ times. Do 1____ sessions per day.  http://gt2.exer.us/309   Copyright  VHI. All rights reserved.
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
## 2017-10-28 ENCOUNTER — Ambulatory Visit (HOSPITAL_COMMUNITY): Payer: Medicare Other

## 2017-10-28 ENCOUNTER — Encounter (HOSPITAL_COMMUNITY): Payer: Self-pay

## 2017-10-28 DIAGNOSIS — M25552 Pain in left hip: Secondary | ICD-10-CM | POA: Diagnosis not present

## 2017-10-28 DIAGNOSIS — M6281 Muscle weakness (generalized): Secondary | ICD-10-CM | POA: Diagnosis not present

## 2017-10-28 DIAGNOSIS — R29898 Other symptoms and signs involving the musculoskeletal system: Secondary | ICD-10-CM | POA: Diagnosis not present

## 2017-10-28 DIAGNOSIS — R2689 Other abnormalities of gait and mobility: Secondary | ICD-10-CM
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
## 2017-10-30 ENCOUNTER — Encounter (HOSPITAL_COMMUNITY): Payer: Self-pay

## 2017-10-30 ENCOUNTER — Ambulatory Visit (HOSPITAL_COMMUNITY): Payer: Medicare Other

## 2017-10-30 DIAGNOSIS — R29898 Other symptoms and signs involving the musculoskeletal system: Secondary | ICD-10-CM

## 2017-10-30 DIAGNOSIS — M6281 Muscle weakness (generalized): Secondary | ICD-10-CM | POA: Diagnosis not present

## 2017-10-30 DIAGNOSIS — R2689 Other abnormalities of gait and mobility: Secondary | ICD-10-CM

## 2017-10-30 DIAGNOSIS — M25552 Pain in left hip: Secondary | ICD-10-CM
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
## 2017-11-03 ENCOUNTER — Encounter (HOSPITAL_COMMUNITY): Payer: Self-pay

## 2017-11-03 ENCOUNTER — Ambulatory Visit (HOSPITAL_COMMUNITY): Payer: Medicare Other

## 2017-11-03 DIAGNOSIS — R2689 Other abnormalities of gait and mobility: Secondary | ICD-10-CM | POA: Diagnosis not present

## 2017-11-03 DIAGNOSIS — R29898 Other symptoms and signs involving the musculoskeletal system: Secondary | ICD-10-CM | POA: Diagnosis not present

## 2017-11-03 DIAGNOSIS — M6281 Muscle weakness (generalized): Secondary | ICD-10-CM | POA: Diagnosis not present

## 2017-11-03 DIAGNOSIS — M25552 Pain in left hip: Secondary | ICD-10-CM | POA: Diagnosis not present
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
## 2017-11-04 ENCOUNTER — Ambulatory Visit (HOSPITAL_COMMUNITY): Payer: Medicare Other

## 2017-11-04 ENCOUNTER — Encounter (HOSPITAL_COMMUNITY): Payer: Self-pay

## 2017-11-04 ENCOUNTER — Encounter: Payer: Self-pay | Admitting: Family Medicine

## 2017-11-04 ENCOUNTER — Encounter (HOSPITAL_COMMUNITY): Payer: Medicare Other

## 2017-11-04 ENCOUNTER — Ambulatory Visit (INDEPENDENT_AMBULATORY_CARE_PROVIDER_SITE_OTHER): Payer: Medicare Other | Admitting: Family Medicine

## 2017-11-04 ENCOUNTER — Other Ambulatory Visit: Payer: Self-pay | Admitting: Family Medicine

## 2017-11-04 VITALS — BP 122/82 | Ht 67.0 in | Wt 294.2 lb

## 2017-11-04 DIAGNOSIS — R2689 Other abnormalities of gait and mobility: Secondary | ICD-10-CM | POA: Diagnosis not present

## 2017-11-04 DIAGNOSIS — E7849 Other hyperlipidemia: Secondary | ICD-10-CM | POA: Diagnosis not present

## 2017-11-04 DIAGNOSIS — M25552 Pain in left hip: Secondary | ICD-10-CM | POA: Diagnosis not present

## 2017-11-04 DIAGNOSIS — R0789 Other chest pain: Secondary | ICD-10-CM

## 2017-11-04 DIAGNOSIS — R29898 Other symptoms and signs involving the musculoskeletal system: Secondary | ICD-10-CM | POA: Diagnosis not present

## 2017-11-04 DIAGNOSIS — I10 Essential (primary) hypertension: Secondary | ICD-10-CM

## 2017-11-04 DIAGNOSIS — Z23 Encounter for immunization: Secondary | ICD-10-CM | POA: Diagnosis not present

## 2017-11-04 DIAGNOSIS — E119 Type 2 diabetes mellitus without complications: Secondary | ICD-10-CM

## 2017-11-04 DIAGNOSIS — M6281 Muscle weakness (generalized): Secondary | ICD-10-CM

## 2017-11-04 LAB — POCT GLYCOSYLATED HEMOGLOBIN (HGB A1C): HEMOGLOBIN A1C: 9.9 % — AB (ref 4.0–5.6)

## 2017-11-04 MED ORDER — ROSUVASTATIN CALCIUM 40 MG PO TABS: 40.0000 mg | ORAL_TABLET | Freq: Every day | ORAL | 1 refills | Status: DC

## 2017-11-04 MED ORDER — POTASSIUM CHLORIDE ER 10 MEQ PO TBCR: EXTENDED_RELEASE_TABLET | ORAL | 1 refills | Status: DC

## 2017-11-04 MED ORDER — INSULIN DETEMIR 100 UNIT/ML FLEXPEN: PEN_INJECTOR | SUBCUTANEOUS | 5 refills | Status: DC

## 2017-11-04 MED ORDER — METFORMIN HCL 500 MG PO TABS: 1000.0000 mg | ORAL_TABLET | Freq: Two times a day (BID) | ORAL | 1 refills | Status: DC

## 2017-11-04 MED ORDER — GABAPENTIN 100 MG PO CAPS
200.0000 mg | ORAL_CAPSULE | Freq: Three times a day (TID) | ORAL | 1 refills | Status: DC
Start: 2017-11-04 — End: 2018-02-04

## 2017-11-04 NOTE — Progress Notes (Signed)
Referral placed and patient is aware. 
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
## 2017-11-04 NOTE — Progress Notes (Signed)
Subjective:    Patient ID: [name], female    DOB: [birth year], 67 y.o.   MRN: [id number]  Diabetes  She presents for her follow-up diabetic visit. She has type 2 diabetes mellitus. Pertinent negatives for hypoglycemia include no confusion or dizziness. Pertinent negatives for diabetes include no chest pain, no fatigue, no polydipsia, no polyphagia and no weakness. Risk factors for coronary artery disease include dyslipidemia, diabetes mellitus, hypertension, obesity and post-menopausal. Current diabetic treatment includes insulin injections. She is compliant with treatment all of the time. She is following a diabetic diet. She has not had a previous visit with a dietitian. She does not see a podiatrist.Eye exam is not current.   Patient relates that she gets a burning sensation in the chest radiates up into the jawline present for 10 to 15 minutes comes when she is at rest denies any problems with swallowing denies any dysphagia denies shortness of breath or sweating with it.  chiropracter helping her back  Feet burn she relates a lot of burning in her feet denies numbness tingling or weakness  Patient does not always follow a healthy diet.  25 minutes was spent with the patient.  This statement verifies that 25 minutes was indeed spent with the patient.  More than 50% of this visit-total duration of the visit-was spent in counseling and coordination of care. The issues that the patient came in for today as reflected in the diagnosis (s) please refer to documentation for further details.   Review of Systems  Constitutional: Negative for activity change, appetite change and fatigue.  HENT: Negative for congestion and rhinorrhea.   Respiratory: Negative for cough and shortness of breath.   Cardiovascular: Negative for chest pain and leg swelling.  Gastrointestinal: Negative for abdominal pain and diarrhea.  Endocrine: Negative for polydipsia and polyphagia.  Skin: Negative for color  change.  Neurological: Negative for dizziness and weakness.  Psychiatric/Behavioral: Negative for behavioral problems and confusion.       Objective:   Physical Exam  Constitutional: She appears well-nourished. No distress.  HENT:  Head: Normocephalic and atraumatic.  Eyes: Right eye exhibits no discharge. Left eye exhibits no discharge.  Neck: No tracheal deviation present.  Cardiovascular: Normal rate, regular rhythm and normal heart sounds.  No murmur heard. Pulmonary/Chest: Effort normal and breath sounds normal. No respiratory distress.  Musculoskeletal: She exhibits no edema.  Lymphadenopathy:    She has no cervical adenopathy.  Neurological: She is alert. Coordination normal.  Skin: Skin is warm and dry.  Psychiatric: She has a normal mood and affect. Her behavior is normal.  Vitals reviewed.         Assessment & Plan:  Diabetes subpar control patient needs to do a better job watching diet referral for diabetic education Adjust insulin increase long-acting insulin 42 units Increase metformin 2 tablets twice daily HTN- Patient was seen today as part of a visit regarding hypertension. The importance of healthy diet and regular physical activity was discussed. The importance of compliance with medications discussed.  Ideal goal is to keep blood pressure low elevated levels certainly below 315/17 when possible.  The patient was counseled that keeping blood pressure under control lessen his risk of complications.  The importance of regular follow-ups was discussed with the patient.  Low-salt diet such as DASH recommended.  Regular physical activity was recommended as well.  Patient was advised to keep regular follow-ups.  The patient was seen today as part of an evaluation regarding hyperlipidemia.  Recent lab work has been reviewed with the patient as well as the goals for good cholesterol care.  In addition to this medications have been discussed the importance of  compliance with diet and medications discussed as well.  Finally the patient is aware that poor control of cholesterol, noncompliance can dramatically increase the risk of complications. The patient will keep regular office visits and the patient does agreed to periodic lab work.  Morbid obesity and very important for the patient watch diet exercise try to lose weight  Other chest pain I doubt cardiac but need to rule this out patient has an increased risk because of her diabetes and sedentary lifestyle  Follow-up 4 months sooner problems  25 minutes was spent with the patient.  This statement verifies that 25 minutes was indeed spent with the patient.  More than 50% of this visit-total duration of the visit-was spent in counseling and coordination of care. The issues that the patient came in for today as reflected in the diagnosis (s) please refer to documentation for further details.

## 2017-11-05 LAB — LIPID PANEL
CHOLESTEROL TOTAL: 245 mg/dL — AB (ref 100–199)
Chol/HDL Ratio: 4.8 ratio — ABNORMAL HIGH (ref 0.0–4.4)
HDL: 51 mg/dL (ref >39)
LDL Calculated: 175 mg/dL — ABNORMAL HIGH (ref 0–99)
Triglycerides: 95 mg/dL (ref 0–149)
VLDL Cholesterol Cal: 19 mg/dL (ref 5–40)

## 2017-11-05 LAB — MICROALBUMIN / CREATININE URINE RATIO
CREATININE, UR: 134.3 mg/dL
MICROALBUM., U, RANDOM: 5.9 ug/mL
Microalb/Creat Ratio: 4.4 mg/g creat (ref 0.0–30.0)

## 2017-11-05 LAB — BASIC METABOLIC PANEL
BUN / CREAT RATIO: 13 (ref 12–28)
BUN: 11 mg/dL (ref 8–27)
CHLORIDE: 99 mmol/L (ref 96–106)
CO2: 26 mmol/L (ref 20–29)
Calcium: 10.2 mg/dL (ref 8.7–10.3)
Creatinine, Ser: 0.85 mg/dL (ref 0.57–1.00)
GFR calc non Af Amer: 71 mL/min/{1.73_m2} (ref >59)
GFR, EST AFRICAN AMERICAN: 82 mL/min/{1.73_m2} (ref >59)
GLUCOSE: 221 mg/dL — AB (ref 65–99)
POTASSIUM: 4.7 mmol/L (ref 3.5–5.2)
Sodium: 140 mmol/L (ref 134–144)

## 2017-11-06 ENCOUNTER — Encounter (HOSPITAL_COMMUNITY): Payer: Self-pay

## 2017-11-06 ENCOUNTER — Ambulatory Visit (HOSPITAL_COMMUNITY): Payer: Medicare Other

## 2017-11-06 DIAGNOSIS — R29898 Other symptoms and signs involving the musculoskeletal system: Secondary | ICD-10-CM | POA: Diagnosis not present

## 2017-11-06 DIAGNOSIS — R2689 Other abnormalities of gait and mobility: Secondary | ICD-10-CM | POA: Diagnosis not present

## 2017-11-06 DIAGNOSIS — M25552 Pain in left hip: Secondary | ICD-10-CM

## 2017-11-06 DIAGNOSIS — M6281 Muscle weakness (generalized): Secondary | ICD-10-CM | POA: Diagnosis not present
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
## 2017-11-09 ENCOUNTER — Encounter: Payer: Self-pay | Admitting: Family Medicine

## 2017-11-10 ENCOUNTER — Encounter (HOSPITAL_COMMUNITY): Payer: Medicare Other

## 2017-11-12 ENCOUNTER — Encounter: Payer: Self-pay | Admitting: Family Medicine

## 2017-11-12 ENCOUNTER — Encounter (HOSPITAL_COMMUNITY): Payer: Medicare Other

## 2017-11-13 ENCOUNTER — Ambulatory Visit (HOSPITAL_COMMUNITY): Payer: Medicare Other | Attending: Orthopedic Surgery

## 2017-11-13 ENCOUNTER — Encounter (HOSPITAL_COMMUNITY): Payer: Self-pay

## 2017-11-13 DIAGNOSIS — R29898 Other symptoms and signs involving the musculoskeletal system: Secondary | ICD-10-CM

## 2017-11-13 DIAGNOSIS — M6281 Muscle weakness (generalized): Secondary | ICD-10-CM

## 2017-11-13 DIAGNOSIS — M25552 Pain in left hip: Secondary | ICD-10-CM | POA: Diagnosis not present

## 2017-11-13 DIAGNOSIS — R2689 Other abnormalities of gait and mobility: Secondary | ICD-10-CM | POA: Insufficient documentation
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
## 2017-11-16 ENCOUNTER — Other Ambulatory Visit: Payer: Self-pay | Admitting: Family Medicine

## 2017-11-16 NOTE — Addendum Note (Signed)
Addended by: Karle Barr on: 11/16/2017 10:20 AM   Modules accepted: Orders

## 2017-11-17 ENCOUNTER — Ambulatory Visit (HOSPITAL_COMMUNITY): Payer: Medicare Other | Admitting: Physical Therapy

## 2017-11-18 ENCOUNTER — Ambulatory Visit (HOSPITAL_COMMUNITY): Payer: Medicare Other

## 2017-11-18 ENCOUNTER — Encounter (HOSPITAL_COMMUNITY): Payer: Self-pay

## 2017-11-18 DIAGNOSIS — M25552 Pain in left hip: Secondary | ICD-10-CM

## 2017-11-18 DIAGNOSIS — R29898 Other symptoms and signs involving the musculoskeletal system: Secondary | ICD-10-CM

## 2017-11-18 DIAGNOSIS — M6281 Muscle weakness (generalized): Secondary | ICD-10-CM | POA: Diagnosis not present

## 2017-11-18 DIAGNOSIS — R2689 Other abnormalities of gait and mobility: Secondary | ICD-10-CM | POA: Diagnosis not present

## 2017-11-18 NOTE — Patient Instructions (Signed)
Single Leg Balance: Eyes Open    Stand on right leg with eyes open. Hold 60 seconds. 5 reps 1-2 times per day.  http://ggbe.exer.us/5   Copyright  VHI. All rights reserved.   Tandem Stance    Right foot in front of left, heel touching toe both feet "straight ahead". Stand on Foot Triangle of Support with both feet. Balance in this position 30 seconds. Do with left foot in front of right.  Copyright  VHI. All rights reserved.
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
## 2017-11-19 ENCOUNTER — Encounter (HOSPITAL_COMMUNITY): Payer: Medicare Other

## 2017-11-20 ENCOUNTER — Encounter (HOSPITAL_COMMUNITY): Payer: Medicare Other

## 2017-11-23 ENCOUNTER — Ambulatory Visit (HOSPITAL_COMMUNITY): Payer: Medicare Other

## 2017-11-23 ENCOUNTER — Telehealth: Payer: Self-pay | Admitting: Family Medicine

## 2017-11-23 ENCOUNTER — Encounter (HOSPITAL_COMMUNITY): Payer: Self-pay

## 2017-11-23 DIAGNOSIS — M25552 Pain in left hip: Secondary | ICD-10-CM

## 2017-11-23 DIAGNOSIS — R2689 Other abnormalities of gait and mobility: Secondary | ICD-10-CM

## 2017-11-23 DIAGNOSIS — R29898 Other symptoms and signs involving the musculoskeletal system: Secondary | ICD-10-CM

## 2017-11-23 DIAGNOSIS — M6281 Muscle weakness (generalized): Secondary | ICD-10-CM | POA: Diagnosis not present
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
## 2017-11-23 NOTE — Patient Instructions (Signed)
Access Code: [email or web address]  URL: [email or web address]  Date: 11/23/2017  Prepared by: Geraldine Solar   Exercises Supine Bridge - 10 reps - 3 sets - 1x daily - 7x weekly Supine Active Straight Leg Raise - 10 reps - 3 sets - 1x daily - 7x weekly Clamshell with Resistance - 10 reps - 3 sets - 1x daily - 7x weekly Sit to Stand without Arm Support - 10 reps - 3 sets - 1x daily - 7x weekly Mini Squat with Chair - 10 reps - 3 sets - 1x daily - 7x weekly Standing Hip Abduction with Resistance at Ankles - 10 reps - 3 sets - 1x daily - 7x weekly Standing Hip Extension Kicks - 10 reps - 3 sets - 1x daily - 7x weekly Side Stepping with Resistance at Ankles - 10 reps - 3 sets - 1x daily - 7x weekly

## 2017-11-23 NOTE — Telephone Encounter (Signed)
Pt dropped off blood sugar log. Placed in box in Dr. Bary Leriche office.

## 2017-11-23 NOTE — Telephone Encounter (Signed)
Please see below.

## 2017-11-24 ENCOUNTER — Encounter (HOSPITAL_COMMUNITY): Payer: Medicare Other

## 2017-11-25 NOTE — Telephone Encounter (Signed)
Left message to return call 

## 2017-11-25 NOTE — Telephone Encounter (Signed)
Please let the patient know that readings overall looks fairly good but could be better Eat healthy Increase long-acting insulin that is given in the evening Old dose 42 units New dose 46 units Please update her medication list Please have the patient send [place] more readings within 2 weeks Continue all other medicines as is

## 2017-11-26 ENCOUNTER — Encounter (HOSPITAL_COMMUNITY): Payer: Medicare Other

## 2017-11-27 ENCOUNTER — Encounter (HOSPITAL_COMMUNITY): Payer: Medicare Other

## 2017-12-01 ENCOUNTER — Other Ambulatory Visit: Payer: Self-pay

## 2017-12-01 ENCOUNTER — Encounter (HOSPITAL_COMMUNITY): Payer: Medicare Other | Admitting: Physical Therapy

## 2017-12-01 MED ORDER — INSULIN DETEMIR 100 UNIT/ML FLEXPEN: PEN_INJECTOR | SUBCUTANEOUS | 5 refills | Status: DC

## 2017-12-01 NOTE — Telephone Encounter (Signed)
Contacted patient. Pt states that she is going out of town and will not be back until first part of December. Pt verbalized understanding to send [place] readings within 2 weeks. Also informed patient to increase long acting insulin to 46 units. Updated medication in Epic.

## 2017-12-02 ENCOUNTER — Ambulatory Visit (INDEPENDENT_AMBULATORY_CARE_PROVIDER_SITE_OTHER): Payer: Medicare Other

## 2017-12-02 ENCOUNTER — Ambulatory Visit (INDEPENDENT_AMBULATORY_CARE_PROVIDER_SITE_OTHER): Payer: Medicare Other | Admitting: Orthopedic Surgery

## 2017-12-02 VITALS — BP 111/60 | HR 69 | Ht 67.0 in

## 2017-12-02 DIAGNOSIS — Z96652 Presence of left artificial knee joint: Secondary | ICD-10-CM
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
## 2017-12-03 ENCOUNTER — Encounter (HOSPITAL_COMMUNITY): Payer: Medicare Other

## 2017-12-04 ENCOUNTER — Encounter (HOSPITAL_COMMUNITY): Payer: Medicare Other

## 2017-12-08 ENCOUNTER — Encounter (HOSPITAL_COMMUNITY): Payer: Medicare Other | Admitting: Physical Therapy

## 2017-12-09 ENCOUNTER — Ambulatory Visit: Payer: Self-pay | Admitting: Orthopedic Surgery

## 2017-12-09 ENCOUNTER — Encounter (HOSPITAL_COMMUNITY): Payer: Medicare Other

## 2017-12-23 ENCOUNTER — Encounter: Payer: Self-pay | Admitting: Cardiology

## 2017-12-23 ENCOUNTER — Ambulatory Visit (INDEPENDENT_AMBULATORY_CARE_PROVIDER_SITE_OTHER): Payer: Medicare Other | Admitting: Cardiology

## 2017-12-23 VITALS — BP 130/68 | HR 84 | Ht 67.0 in | Wt 303.0 lb

## 2017-12-23 DIAGNOSIS — R079 Chest pain, unspecified: Secondary | ICD-10-CM

## 2017-12-23 DIAGNOSIS — R072 Precordial pain: Secondary | ICD-10-CM

## 2017-12-23 DIAGNOSIS — R0789 Other chest pain: Secondary | ICD-10-CM

## 2017-12-23 DIAGNOSIS — E1165 Type 2 diabetes mellitus with hyperglycemia: Secondary | ICD-10-CM

## 2017-12-23 DIAGNOSIS — E782 Mixed hyperlipidemia: Secondary | ICD-10-CM

## 2017-12-23 NOTE — Patient Instructions (Addendum)
Medication Instructions:  Your physician recommends that you continue on your current medications as directed. Please refer to the Current Medication list given to you today.  Labwork: NONE  Testing/Procedures: Your physician has requested that you have a lexiscan myoview. For further information please visit [email or web address]. Please follow instruction sheet, as given. 2 Day Study   Follow-Up: Your physician recommends that you schedule a follow-up appointment in: AS NEEDED- WILL CALL WITH RESULTS    Any Other Special Instructions Will Be Listed Below (If Applicable).     If you need a refill on your cardiac medications before your next appointment, please call your pharmacy.
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
## 2017-12-28 ENCOUNTER — Ambulatory Visit (INDEPENDENT_AMBULATORY_CARE_PROVIDER_SITE_OTHER): Payer: Medicare Other | Admitting: Orthopedic Surgery

## 2017-12-28 ENCOUNTER — Ambulatory Visit: Payer: Medicare Other | Admitting: Orthopedic Surgery

## 2017-12-28 ENCOUNTER — Encounter: Payer: Self-pay | Admitting: Orthopedic Surgery

## 2017-12-28 VITALS — BP 136/75 | HR 78 | Ht 67.0 in | Wt 301.0 lb

## 2017-12-28 DIAGNOSIS — M5410 Radiculopathy, site unspecified: Secondary | ICD-10-CM

## 2017-12-28 DIAGNOSIS — Z6841 Body Mass Index (BMI) 40.0 and over, adult: Secondary | ICD-10-CM

## 2017-12-28 NOTE — Progress Notes (Signed)
Progress Note   Patient ID: [name], female   DOB: [birth year], 67 y.o.   MRN: [id number]   Chief Complaint  Patient presents with  . Back Pain    feels better     HPI -67 year old female status post multiple joint replacements presents for evaluation of chronic back pain asymptomatic at this time   ROS Negative for any radicular symptoms  Allergies  Allergen Reactions  . Penicillins Itching    Has patient had a PCN reaction causing immediate rash, facial/tongue/throat swelling, SOB or lightheadedness with hypotension: No Has patient had a PCN reaction causing severe rash involving mucus membranes or skin necrosis: No Has patient had a PCN reaction that required hospitalization: No Has patient had a PCN reaction occurring within the last 10 years: No If all of the above answers are "NO", then may proceed with Cephalosporin use.   Marland Kitchen Hydrocodone Itching     BP 136/75   Pulse 78   Ht 5\' 7"  (1.702 m)   Wt (!) 301 lb (136.5 kg)   BMI 47.14 kg/m   Physical Exam Normal back exam with no tenderness just increased pelvic anterior rotation Medical decisions:   Data  Imaging:   None  Encounter Diagnoses  Name Primary?  . Radicular syndrome of left leg Yes  . Body mass index 45.0-49.9, adult ([place])   . Morbid obesity ([place])     PLAN:   No follow-up as needed regarding her back  The patient meets the AMA guidelines for Morbid (severe) obesity with a BMI > 40.0 and I have recommended weight loss.     Arther Abbott, MD 12/28/2017 8:51 AM

## 2017-12-29 ENCOUNTER — Encounter (HOSPITAL_COMMUNITY): Payer: Self-pay

## 2017-12-29 ENCOUNTER — Encounter (HOSPITAL_BASED_OUTPATIENT_CLINIC_OR_DEPARTMENT_OTHER)
Admission: RE | Admit: 2017-12-29 | Discharge: 2017-12-29 | Disposition: A | Discharge status: Home / Self Care | Payer: Medicare Other | Source: Ambulatory Visit | Attending: Cardiology | Admitting: Cardiology

## 2017-12-29 ENCOUNTER — Encounter (HOSPITAL_COMMUNITY)
Admission: RE | Admit: 2017-12-29 | Discharge: 2017-12-29 | Disposition: A | Discharge status: Home / Self Care | Payer: Medicare Other | Source: Ambulatory Visit | Attending: Cardiology | Admitting: Cardiology

## 2017-12-29 ENCOUNTER — Encounter (HOSPITAL_COMMUNITY): Admission: RE | Admit: 2017-12-29 | Payer: Medicare Other | Source: Ambulatory Visit

## 2017-12-29 DIAGNOSIS — R0789 Other chest pain: Secondary | ICD-10-CM | POA: Diagnosis not present

## 2017-12-29 MED ORDER — SODIUM CHLORIDE 0.9% FLUSH
INTRAVENOUS | Status: AC
  Administered 2017-12-29: 10 mL via INTRAVENOUS
  Filled 2017-12-29: qty 10

## 2017-12-29 MED ORDER — REGADENOSON 0.4 MG/5ML IV SOLN
INTRAVENOUS | Status: AC
  Administered 2017-12-29: 0.4 mg via INTRAVENOUS
  Filled 2017-12-29: qty 5

## 2017-12-29 MED ORDER — TECHNETIUM TC 99M TETROFOSMIN IV KIT
30.0000 | PACK | Freq: Once | INTRAVENOUS | Status: AC | PRN
  Administered 2017-12-29: 27 via INTRAVENOUS

## 2017-12-30 ENCOUNTER — Encounter (HOSPITAL_COMMUNITY)
Admission: RE | Admit: 2017-12-30 | Discharge: 2017-12-30 | Disposition: A | Discharge status: Home / Self Care | Payer: Medicare Other | Source: Ambulatory Visit | Attending: Cardiology | Admitting: Cardiology

## 2017-12-30 LAB — NM MYOCAR MULTI W/SPECT W/WALL MOTION / EF
CHL CUP NUCLEAR SRS: 5
CHL CUP RESTING HR STRESS: 61 {beats}/min
CSEPPHR: 82 {beats}/min
LV dias vol: 76 mL (ref 46–106)
LVSYSVOL: 31 mL
NUC STRESS TID: 0.74
RATE: 0.32
SDS: 1
SSS: 6

## 2017-12-30 MED ORDER — TECHNETIUM TC 99M TETROFOSMIN IV KIT
30.0000 | PACK | Freq: Once | INTRAVENOUS | Status: AC | PRN
Start: 2017-12-30 — End: 2017-12-30
  Administered 2017-12-30: 29.5 via INTRAVENOUS

## 2018-01-07 ENCOUNTER — Other Ambulatory Visit: Payer: Self-pay | Admitting: Family Medicine

## 2018-01-12 ENCOUNTER — Ambulatory Visit (HOSPITAL_COMMUNITY): Payer: Medicare Other | Admitting: Physical Therapy

## 2018-02-04 ENCOUNTER — Ambulatory Visit (INDEPENDENT_AMBULATORY_CARE_PROVIDER_SITE_OTHER): Payer: PPO | Admitting: Family Medicine

## 2018-02-04 ENCOUNTER — Encounter: Payer: Self-pay | Admitting: Family Medicine

## 2018-02-04 VITALS — BP 128/76 | Ht 67.0 in | Wt 299.2 lb

## 2018-02-04 DIAGNOSIS — E782 Mixed hyperlipidemia: Secondary | ICD-10-CM

## 2018-02-04 DIAGNOSIS — E1140 Type 2 diabetes mellitus with diabetic neuropathy, unspecified: Secondary | ICD-10-CM | POA: Diagnosis not present

## 2018-02-04 DIAGNOSIS — Z794 Long term (current) use of insulin: Secondary | ICD-10-CM

## 2018-02-04 DIAGNOSIS — I10 Essential (primary) hypertension: Secondary | ICD-10-CM

## 2018-02-04 DIAGNOSIS — E119 Type 2 diabetes mellitus without complications: Secondary | ICD-10-CM | POA: Diagnosis not present

## 2018-02-04 HISTORY — DX: Type 2 diabetes mellitus with diabetic neuropathy, unspecified: E11.40

## 2018-02-04 LAB — POCT GLYCOSYLATED HEMOGLOBIN (HGB A1C): HEMOGLOBIN A1C: 7.5 % — AB (ref 4.0–5.6)

## 2018-02-04 MED ORDER — BLOOD GLUCOSE MONITOR KIT: PACK | 0 refills | Status: DC

## 2018-02-04 MED ORDER — FUROSEMIDE 20 MG PO TABS: ORAL_TABLET | ORAL | 1 refills | Status: DC

## 2018-02-04 MED ORDER — ROSUVASTATIN CALCIUM 40 MG PO TABS: 40.0000 mg | ORAL_TABLET | Freq: Every day | ORAL | 1 refills | Status: DC

## 2018-02-04 MED ORDER — INSULIN DETEMIR 100 UNIT/ML FLEXPEN: PEN_INJECTOR | SUBCUTANEOUS | 5 refills | Status: DC

## 2018-02-04 MED ORDER — POTASSIUM CHLORIDE ER 10 MEQ PO TBCR: EXTENDED_RELEASE_TABLET | ORAL | 1 refills | Status: DC

## 2018-02-04 MED ORDER — ZOSTER VAC RECOMB ADJUVANTED 50 MCG/0.5ML IM SUSR: 0.5000 mL | Freq: Once | INTRAMUSCULAR | 1 refills | Status: AC

## 2018-02-04 MED ORDER — METFORMIN HCL 500 MG PO TABS: 1000.0000 mg | ORAL_TABLET | Freq: Two times a day (BID) | ORAL | 1 refills | Status: DC

## 2018-02-04 MED ORDER — GABAPENTIN 100 MG PO CAPS: ORAL_CAPSULE | ORAL | 1 refills | Status: DC

## 2018-02-04 NOTE — Patient Instructions (Signed)

## 2018-02-04 NOTE — Progress Notes (Signed)
Subjective:    Patient ID: [name], female    DOB: 24-Apr-1950, 68 y.o.   MRN: [id number]  Diabetes  She presents for her follow-up diabetic visit. She has type 2 diabetes mellitus. Pertinent negatives for hypoglycemia include no confusion or dizziness. Pertinent negatives for diabetes include no chest pain, no fatigue, no polydipsia, no polyphagia and no weakness. She is compliant with treatment all of the time. She is following a generally healthy diet. She never participates in exercise. Home blood sugar record trend: 110 - 259. She does not see a podiatrist (still having bilateral foot pain. going on for over one year. would like referral to podiatrist).Eye exam is not current (over one year ago).   Pt needs new glucose monitior and testing strips.  Patient for blood pressure check up.  The patient does have hypertension.  The patient is on medication.  Patient relates compliance with meds. Todays BP reviewed with the patient. Patient denies issues with medication. Patient relates reasonable diet. Patient tries to minimize salt. Patient aware of BP goals.  Patient here for follow-up regarding cholesterol.  The patient does have hyperlipidemia.  Patient does try to maintain a reasonable diet.  Patient does take the medication on a regular basis.  Denies missing a dose.  The patient denies any obvious side effects.  Prior blood work results reviewed with the patient.  The patient is aware of his cholesterol goals and the need to keep it under good control to lessen the risk of disease.  Patient does have some burning in her feet along with some numbness and tingling She denies any swelling redness pain or discomfort in regards to infection  Patient does have morbid obesity she does try to watch how she eats she does try to limit how much she eats she is unable to do much in way of exercise Results for orders placed or performed in visit on 02/04/18  POCT HgB A1C  Result Value Ref Range     Hemoglobin A1C 7.5 (A) 4.0 - 5.6 %   HbA1c POC (<> result, manual entry)     HbA1c, POC (prediabetic range)     HbA1c, POC (controlled diabetic range)       Review of Systems  Constitutional: Negative for activity change, appetite change and fatigue.  HENT: Negative for congestion and rhinorrhea.   Respiratory: Negative for cough and shortness of breath.   Cardiovascular: Negative for chest pain and leg swelling.  Gastrointestinal: Negative for abdominal pain and diarrhea.  Endocrine: Negative for polydipsia and polyphagia.  Skin: Negative for color change.  Neurological: Negative for dizziness and weakness.  Psychiatric/Behavioral: Negative for behavioral problems and confusion.       Objective:   Physical Exam Vitals signs reviewed.  Constitutional:      General: She is not in acute distress. HENT:     Head: Normocephalic and atraumatic.  Eyes:     General:        Right eye: No discharge.        Left eye: No discharge.  Neck:     Trachea: No tracheal deviation.  Cardiovascular:     Rate and Rhythm: Normal rate and regular rhythm.     Heart sounds: Normal heart sounds. No murmur.  Pulmonary:     Effort: Pulmonary effort is normal. No respiratory distress.     Breath sounds: Normal breath sounds.  Lymphadenopathy:     Cervical: No cervical adenopathy.  Skin:    General: Skin  is warm and dry.  Neurological:     Mental Status: She is alert.     Coordination: Coordination normal.  Psychiatric:        Behavior: Behavior normal.     Diabetic foot exam was completed patient does have mild neuropathy      Assessment & Plan:  HTN- Patient was seen today as part of a visit regarding hypertension. The importance of healthy diet and regular physical activity was discussed. The importance of compliance with medications discussed.  Ideal goal is to keep blood pressure low elevated levels certainly below 102/72 when possible.  The patient was counseled that keeping  blood pressure under control lessen his risk of complications.  The importance of regular follow-ups was discussed with the patient.  Low-salt diet such as DASH recommended.  Regular physical activity was recommended as well.  Patient was advised to keep regular follow-ups.  The patient was seen today as part of a comprehensive visit for diabetes. The importance of keeping her A1c at or below 7 was discussed.  Importance of regular physical activity was discussed.   The importance of adherence to medication as well as a controlled low starch/sugar diet was also discussed.  Standard follow-up visit recommended.  Also patient aware failure to keep diabetes under control increases the risk of complications.  Patient will check sugars on a regular basis and the some readings in a couple weeks time may need medication adjustments  The patient's BMI is calculated.  The patient does have obesity.  The patient does try to some degree staying active and watching diet.  It is in the vital signs and acknowledged.  It is above the recommended BMI for the patient's height and weight.  The patient has been counseled regarding healthy diet, restricted portions, avoiding excessive carbohydrates/sugary foods, and increase physical activity as health permits.  It is in the patient's best interest to lower the risk of secondary illness including heart disease strokes and cancer by losing weight.  The patient acknowledges this information. Morbid obesity patient was encouraged to watch portions stay active try to lose weight  Neuropathy in the feet increase gabapentin hopefully this will help get diabetes under better control  The patient was seen today as part of an evaluation regarding hyperlipidemia.  Recent lab work has been reviewed with the patient as well as the goals for good cholesterol care.  In addition to this medications have been discussed the importance of compliance with diet and medications  discussed as well.  Finally the patient is aware that poor control of cholesterol, noncompliance can dramatically increase the risk of complications. The patient will keep regular office visits and the patient does agreed to periodic lab work.  25 minutes was spent with the patient.  This statement verifies that 25 minutes was indeed spent with the patient.  More than 50% of this visit-total duration of the visit-was spent in counseling and coordination of care. The issues that the patient came in for today as reflected in the diagnosis (s) please refer to documentation for further details.

## 2018-02-05 ENCOUNTER — Encounter: Payer: Self-pay | Admitting: Family Medicine

## 2018-02-05 LAB — BASIC METABOLIC PANEL
BUN / CREAT RATIO: 12 (ref 12–28)
BUN: 10 mg/dL (ref 8–27)
CO2: 25 mmol/L (ref 20–29)
Calcium: 9.7 mg/dL (ref 8.7–10.3)
Chloride: 97 mmol/L (ref 96–106)
Creatinine, Ser: 0.83 mg/dL (ref 0.57–1.00)
GFR calc non Af Amer: 73 mL/min/{1.73_m2} (ref >59)
GFR, EST AFRICAN AMERICAN: 84 mL/min/{1.73_m2} (ref >59)
GLUCOSE: 196 mg/dL — AB (ref 65–99)
POTASSIUM: 4.6 mmol/L (ref 3.5–5.2)
Sodium: 138 mmol/L (ref 134–144)

## 2018-02-05 LAB — LIPID PANEL
CHOL/HDL RATIO: 4.4 ratio (ref 0.0–4.4)
Cholesterol, Total: 190 mg/dL (ref 100–199)
HDL: 43 mg/dL (ref >39)
LDL CALC: 121 mg/dL — AB (ref 0–99)
TRIGLYCERIDES: 129 mg/dL (ref 0–149)
VLDL Cholesterol Cal: 26 mg/dL (ref 5–40)

## 2018-03-09 ENCOUNTER — Telehealth: Payer: Self-pay | Admitting: Family Medicine

## 2018-03-09 NOTE — Telephone Encounter (Signed)
Glucose readings placed in red folder in Dr.Scott's box.

## 2018-03-10 NOTE — Telephone Encounter (Signed)
I reviewed over glucose readings They showed significant elevation Please confirm with the patient she is currently using 50 units of the long-acting insulin If that is correct then do the following New dose 54 units daily Send [place] readings again in a couple weeks Keep everything else as is Keep all regular follow-ups

## 2018-03-10 NOTE — Telephone Encounter (Signed)
I called left a vm to r/c.

## 2018-03-17 NOTE — Telephone Encounter (Signed)
Patient advised per Dr Nicki Reaper- Patient's  glucose readings showed significant elevation Please confirm with the patient she is currently using 50 units of the long-acting insulin If that is correct then do the following New dose 54 units daily Send [place] readings again in a couple weeks Keep everything else as is Keep all regular follow-ups Patient verbalized understanding and stated she was doing the 50 units and will now increase to 54 units and send reading in a few weeks.

## 2018-04-11 ENCOUNTER — Other Ambulatory Visit: Payer: Self-pay | Admitting: Family Medicine

## 2018-05-22 ENCOUNTER — Other Ambulatory Visit: Payer: Self-pay | Admitting: Family Medicine

## 2018-06-03 ENCOUNTER — Ambulatory Visit: Payer: PPO | Admitting: Family Medicine

## 2018-06-14 ENCOUNTER — Other Ambulatory Visit: Payer: Self-pay | Admitting: *Deleted

## 2018-06-14 NOTE — Patient Outreach (Signed)
[place] [facility]) Care Management  06/14/2018  [name] 08/14/1950 [id number]   Referral source : HTA health risk screening .   Outreach call to patient to follow up on recent health risk screening call  care needs.  Unsuccessful outreach call, able to leave a HIPAA compliant message for return call    Plan  Will send unsuccessful outreach letter Will plan return call in the next 4 business days.    [name], RN, [place] Management Coordinator  [phone]- Mobile [phone]- Toll Free Main Office

## 2018-06-16 ENCOUNTER — Other Ambulatory Visit: Payer: Self-pay | Admitting: *Deleted

## 2018-06-16 NOTE — Patient Outreach (Signed)
[place] [facility]) Care Management  06/16/2018  Markan RENESME KERRIGAN [birth year] [id number]  Telephone outreach #2  Referral source : HTA health risk screening .   Outreach call to patient to follow up on recent health risk screening call  care needs.   Unsuccessful outreach call, no answer able to leave a HIPAA compliant message for return call.    Plan Will plan 3rd call attempt in the next 4 business days    [name], RN, [place] Management Coordinator  [phone]- Mobile [phone]- [place]

## 2018-06-21 ENCOUNTER — Other Ambulatory Visit: Payer: Self-pay | Admitting: *Deleted

## 2018-06-21 NOTE — Patient Outreach (Signed)
[place] [facility]) Care Management  06/21/2018  [name] 21-Nov-1950 [id number]   Telephone outreach #2  Referral source : HTA health risk screening .   Outreach call to patient to follow up on recent health risk screening callcare needs.   Unsuccessful outreach call, no answer able to leave a HIPAA compliant message for return call.    Plan Unsuccessful contacts x 3  no response from outreach letter . Will plan to place patient in the HTA/HRA  engaged program for future outreach .    [name], RN, [place] Management Coordinator  [phone]- Mobile [phone]- Toll Free Main Office

## 2018-06-25 ENCOUNTER — Other Ambulatory Visit (HOSPITAL_COMMUNITY): Payer: Self-pay | Admitting: Family Medicine

## 2018-06-25 DIAGNOSIS — Z1231 Encounter for screening mammogram for malignant neoplasm of breast: Secondary | ICD-10-CM

## 2018-07-05 ENCOUNTER — Emergency Department (HOSPITAL_COMMUNITY)
Admission: EM | Admit: 2018-07-05 | Discharge: 2018-07-05 | Disposition: A | Discharge status: Home / Self Care | Payer: PPO | Attending: Emergency Medicine | Admitting: Emergency Medicine

## 2018-07-05 ENCOUNTER — Other Ambulatory Visit: Payer: Self-pay

## 2018-07-05 ENCOUNTER — Encounter (HOSPITAL_COMMUNITY): Payer: Self-pay | Admitting: *Deleted

## 2018-07-05 DIAGNOSIS — I10 Essential (primary) hypertension: Secondary | ICD-10-CM | POA: Diagnosis not present

## 2018-07-05 DIAGNOSIS — S29012A Strain of muscle and tendon of back wall of thorax, initial encounter: Secondary | ICD-10-CM | POA: Diagnosis not present

## 2018-07-05 DIAGNOSIS — E1140 Type 2 diabetes mellitus with diabetic neuropathy, unspecified: Secondary | ICD-10-CM | POA: Diagnosis not present

## 2018-07-05 DIAGNOSIS — Z79899 Other long term (current) drug therapy: Secondary | ICD-10-CM | POA: Insufficient documentation

## 2018-07-05 DIAGNOSIS — Z794 Long term (current) use of insulin: Secondary | ICD-10-CM | POA: Diagnosis not present

## 2018-07-05 DIAGNOSIS — Z7982 Long term (current) use of aspirin: Secondary | ICD-10-CM | POA: Insufficient documentation

## 2018-07-05 DIAGNOSIS — E1165 Type 2 diabetes mellitus with hyperglycemia: Secondary | ICD-10-CM | POA: Diagnosis not present

## 2018-07-05 DIAGNOSIS — M549 Dorsalgia, unspecified: Secondary | ICD-10-CM | POA: Diagnosis not present

## 2018-07-05 DIAGNOSIS — S0990XA Unspecified injury of head, initial encounter: Secondary | ICD-10-CM | POA: Diagnosis not present

## 2018-07-05 LAB — CBG MONITORING, ED
Glucose-Capillary: 494 mg/dL — ABNORMAL HIGH (ref 70–99)
Glucose-Capillary: 317 mg/dL — ABNORMAL HIGH (ref 70–99)

## 2018-07-05 MED ORDER — INSULIN ASPART 100 UNIT/ML ~~LOC~~ SOLN
5.0000 [IU] | Freq: Once | SUBCUTANEOUS | Status: AC
  Administered 2018-07-05: 5 [IU] via SUBCUTANEOUS

## 2018-07-05 MED ORDER — IBUPROFEN 800 MG PO TABS: 800.0000 mg | ORAL_TABLET | Freq: Three times a day (TID) | ORAL | 0 refills | Status: DC | PRN

## 2018-07-05 MED ORDER — INSULIN ASPART 100 UNIT/ML ~~LOC~~ SOLN
19.0000 [IU] | Freq: Once | SUBCUTANEOUS | Status: DC
  Filled 2018-07-05: qty 1

## 2018-07-05 MED ORDER — TRAMADOL HCL 50 MG PO TABS
50.0000 mg | ORAL_TABLET | Freq: Once | ORAL | Status: AC
  Administered 2018-07-05: 50 mg via ORAL
  Filled 2018-07-05: qty 1
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
## 2018-07-05 NOTE — Discharge Instructions (Signed)
Follow-up with your family doctor this week for recheck of your soreness and also recheck your shoulder

## 2018-07-06 ENCOUNTER — Other Ambulatory Visit: Payer: Self-pay | Admitting: Family Medicine

## 2018-07-08 ENCOUNTER — Ambulatory Visit (INDEPENDENT_AMBULATORY_CARE_PROVIDER_SITE_OTHER): Payer: PPO | Admitting: Family Medicine

## 2018-07-08 ENCOUNTER — Other Ambulatory Visit: Payer: Self-pay

## 2018-07-08 DIAGNOSIS — M542 Cervicalgia: Secondary | ICD-10-CM | POA: Diagnosis not present

## 2018-07-08 MED ORDER — TRAMADOL HCL 50 MG PO TABS: 50.0000 mg | ORAL_TABLET | Freq: Three times a day (TID) | ORAL | 0 refills | Status: DC | PRN
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
## 2018-07-12 ENCOUNTER — Telehealth: Payer: Self-pay | Admitting: Family Medicine

## 2018-07-12 ENCOUNTER — Other Ambulatory Visit: Payer: Self-pay | Admitting: Family Medicine

## 2018-07-12 MED ORDER — NAPROXEN 500 MG PO TABS: ORAL_TABLET | ORAL | 0 refills | Status: DC

## 2018-07-12 NOTE — Telephone Encounter (Signed)
Pt calling needing something else called instead of traMADol (ULTRAM) 50 MG tablet it is causing her to itch.   [facility] [place], [place] - [address]

## 2018-07-12 NOTE — Telephone Encounter (Signed)
Pt contacted and verbalized understanding. Tramadol placed on allergy list. Naproxen sent to pharmacy.

## 2018-07-12 NOTE — Telephone Encounter (Signed)
Please advise. Thank you

## 2018-07-12 NOTE — Telephone Encounter (Signed)
May try naproxen 500 mg 1 twice daily as needed #20 Also please put on her medical record that she is allergic to tramadol causes itching thank you  If ongoing trouble let us know

## 2018-08-09 ENCOUNTER — Ambulatory Visit (HOSPITAL_COMMUNITY)
Admission: RE | Admit: 2018-08-09 | Discharge: 2018-08-09 | Disposition: A | Discharge status: Home / Self Care | Payer: PPO | Source: Ambulatory Visit | Attending: Family Medicine | Admitting: Family Medicine

## 2018-08-09 ENCOUNTER — Other Ambulatory Visit: Payer: Self-pay

## 2018-08-09 DIAGNOSIS — Z1231 Encounter for screening mammogram for malignant neoplasm of breast: Secondary | ICD-10-CM | POA: Insufficient documentation

## 2018-08-25 ENCOUNTER — Other Ambulatory Visit: Payer: Self-pay | Admitting: *Deleted

## 2018-08-25 NOTE — Patient Outreach (Signed)
This encounter was created in error - please disregard.

## 2018-10-11 ENCOUNTER — Other Ambulatory Visit: Payer: Self-pay | Admitting: Family Medicine

## 2018-10-12 NOTE — Telephone Encounter (Signed)
Schedule office visit within the next 30 days May have 1 refill

## 2018-10-13 NOTE — Telephone Encounter (Signed)
Patient has schedule appointment for 10/9

## 2018-10-22 ENCOUNTER — Ambulatory Visit (INDEPENDENT_AMBULATORY_CARE_PROVIDER_SITE_OTHER): Payer: Medicare Other | Admitting: Family Medicine

## 2018-10-22 ENCOUNTER — Other Ambulatory Visit: Payer: Self-pay

## 2018-10-22 DIAGNOSIS — I10 Essential (primary) hypertension: Secondary | ICD-10-CM

## 2018-10-22 DIAGNOSIS — E782 Mixed hyperlipidemia: Secondary | ICD-10-CM | POA: Diagnosis not present

## 2018-10-22 DIAGNOSIS — E119 Type 2 diabetes mellitus without complications: Secondary | ICD-10-CM | POA: Diagnosis not present

## 2018-10-22 DIAGNOSIS — E1140 Type 2 diabetes mellitus with diabetic neuropathy, unspecified: Secondary | ICD-10-CM

## 2018-10-22 DIAGNOSIS — Z794 Long term (current) use of insulin: Secondary | ICD-10-CM

## 2018-10-22 MED ORDER — METFORMIN HCL 500 MG PO TABS: 1000.0000 mg | ORAL_TABLET | Freq: Two times a day (BID) | ORAL | 1 refills | Status: DC

## 2018-10-22 MED ORDER — POTASSIUM CHLORIDE ER 10 MEQ PO TBCR: EXTENDED_RELEASE_TABLET | ORAL | 1 refills | Status: DC

## 2018-10-22 MED ORDER — ROSUVASTATIN CALCIUM 40 MG PO TABS: 40.0000 mg | ORAL_TABLET | Freq: Every day | ORAL | 1 refills | Status: DC

## 2018-10-22 MED ORDER — GABAPENTIN 100 MG PO CAPS: ORAL_CAPSULE | ORAL | 1 refills | Status: DC

## 2018-10-22 NOTE — Progress Notes (Signed)
Subjective:    Patient ID: [name], female    DOB: 1950/03/06, 68 y.o.   MRN: [id number]  Diabetes She presents for her follow-up diabetic visit. She has type 2 diabetes mellitus. Pertinent negatives for hypoglycemia include no confusion or dizziness. Pertinent negatives for diabetes include no chest pain, no fatigue, no polydipsia, no polyphagia and no weakness. Current diabetic treatments: levemir, metformin. She is compliant with treatment all of the time. Exercise: pt states she does exercise. Home blood sugar record trend: pt states sugar is fine. Eye exam is not current (over one year ago but does have one scheduled in december).   Pt states no concerns today.  Patient does have significant neuropathy in her feet she states he gets intermittent burning and discomfort medication does help denies any drowsiness with the medicine  Blood pressure issues takes her medicine watches salt in the diet does not think she is having any major problem  Hyperlipidemia takes her medicine watches diet tries to stay active  Does suffer with morbid obesity she knows the importance of watching diet minimizing portions staying active trying to lose weight   Virtual Visit via Telephone Note  I connected with [name] on 10/22/18 at 10:00 AM EDT by telephone and verified that I am speaking with the correct person using two identifiers.  Location: Patient: home Provider: office   I discussed the limitations, risks, security and privacy concerns of performing an evaluation and management service by telephone and the availability of in person appointments. I also discussed with the patient that there may be a patient responsible charge related to this service. The patient expressed understanding and agreed to proceed.   History of Present Illness:    Observations/Objective:   Assessment and Plan:   Follow Up Instructions:    I discussed the assessment and treatment plan with the  patient. The patient was provided an opportunity to ask questions and all were answered. The patient agreed with the plan and demonstrated an understanding of the instructions.   The patient was advised to call back or seek an in-person evaluation if the symptoms worsen or if the condition fails to improve as anticipated.  I provided 25 minutes of non-face-to-face time during this encounter.   25 minutes was spent with the patient.  This statement verifies that 25 minutes was indeed spent with the patient.  More than 50% of this visit-total duration of the visit-was spent in counseling and coordination of care. The issues that the patient came in for today as reflected in the diagnosis (s) please refer to documentation for further details.    Review of Systems  Constitutional: Negative for activity change, appetite change and fatigue.  HENT: Negative for congestion and rhinorrhea.   Respiratory: Negative for cough and shortness of breath.   Cardiovascular: Negative for chest pain and leg swelling.  Gastrointestinal: Negative for abdominal pain and diarrhea.  Endocrine: Negative for polydipsia and polyphagia.  Skin: Negative for color change.  Neurological: Negative for dizziness and weakness.  Psychiatric/Behavioral: Negative for behavioral problems and confusion.  Intermittent burning and numbness in the feet     Objective:   Physical Exam   Today's visit was via telephone Physical exam was not possible for this visit      Assessment & Plan:  1. Type 2 diabetes mellitus with hemoglobin A1c goal of less than 7.5% [facility]) Patient will do lab work in the near future.  She will watch her diet closely.  Should take her medicines on a regular basis previous A1c's have been acceptable  2. Type 2 diabetes mellitus with diabetic neuropathy, with long-term current use of insulin ([place]) She does have neuropathy the gabapentin helps she denies any major setbacks with this.  Unable to do foot  exam via phone  3. Essential hypertension Blood pressure is been under good control she takes her medicine she watches salt she stays active  4. Mixed hyperlipidemia She has cholesterol issues and is important for her to continue medicine watch diet previous labs reviewed  5. Morbid obesity ([place]) Morbid obesity she is doing her best to try to cut back on portions stay active and try to lose weight We will go ahead and send the patient paperwork for her labs she will do those later this year

## 2018-10-25 NOTE — Progress Notes (Signed)
Lab orders placed and mailed to patient  

## 2018-10-25 NOTE — Addendum Note (Signed)
Addended by: Vicente Males on: 10/25/2018 05:03 PM   Modules accepted: Orders

## 2018-11-18 ENCOUNTER — Encounter: Payer: Self-pay | Admitting: Gastroenterology

## 2018-11-18 DIAGNOSIS — E119 Type 2 diabetes mellitus without complications: Secondary | ICD-10-CM | POA: Diagnosis not present

## 2018-11-18 DIAGNOSIS — I10 Essential (primary) hypertension: Secondary | ICD-10-CM | POA: Diagnosis not present

## 2018-11-18 DIAGNOSIS — E782 Mixed hyperlipidemia: Secondary | ICD-10-CM | POA: Diagnosis not present

## 2018-11-18 DIAGNOSIS — Z794 Long term (current) use of insulin: Secondary | ICD-10-CM | POA: Diagnosis not present

## 2018-11-18 DIAGNOSIS — E1140 Type 2 diabetes mellitus with diabetic neuropathy, unspecified: Secondary | ICD-10-CM | POA: Diagnosis not present

## 2018-11-19 LAB — BASIC METABOLIC PANEL
BUN/Creatinine Ratio: 13 (ref 12–28)
BUN: 13 mg/dL (ref 8–27)
CO2: 25 mmol/L (ref 20–29)
Calcium: 9.7 mg/dL (ref 8.7–10.3)
Chloride: 95 mmol/L — ABNORMAL LOW (ref 96–106)
Creatinine, Ser: 0.99 mg/dL (ref 0.57–1.00)
GFR calc Af Amer: 68 mL/min/{1.73_m2} (ref >59)
GFR calc non Af Amer: 59 mL/min/{1.73_m2} — ABNORMAL LOW (ref >59)
Glucose: 359 mg/dL — ABNORMAL HIGH (ref 65–99)
Potassium: 4 mmol/L (ref 3.5–5.2)
Sodium: 135 mmol/L (ref 134–144)

## 2018-11-19 LAB — HEMOGLOBIN A1C
Est. average glucose Bld gHb Est-mCnc: 352 mg/dL
Hgb A1c MFr Bld: 13.9 % — ABNORMAL HIGH (ref 4.8–5.6)

## 2018-11-19 LAB — HEPATIC FUNCTION PANEL
ALT: 15 IU/L (ref 0–32)
AST: 14 IU/L (ref 0–40)
Albumin: 4.2 g/dL (ref 3.8–4.8)
Alkaline Phosphatase: 195 IU/L — ABNORMAL HIGH (ref 39–117)
Bilirubin Total: 0.3 mg/dL (ref 0.0–1.2)
Bilirubin, Direct: 0.08 mg/dL (ref 0.00–0.40)
Total Protein: 7.8 g/dL (ref 6.0–8.5)

## 2018-11-19 LAB — LIPID PANEL
Chol/HDL Ratio: 5 ratio — ABNORMAL HIGH (ref 0.0–4.4)
Cholesterol, Total: 260 mg/dL — ABNORMAL HIGH (ref 100–199)
HDL: 52 mg/dL (ref >39)
LDL Chol Calc (NIH): 183 mg/dL — ABNORMAL HIGH (ref 0–99)
Triglycerides: 137 mg/dL (ref 0–149)
VLDL Cholesterol Cal: 25 mg/dL (ref 5–40)

## 2018-11-19 LAB — MICROALBUMIN / CREATININE URINE RATIO
Creatinine, Urine: 86.3 mg/dL
Microalb/Creat Ratio: 12 mg/g creat (ref 0–29)
Microalbumin, Urine: 10.2 ug/mL

## 2018-11-23 ENCOUNTER — Other Ambulatory Visit: Payer: Self-pay | Admitting: *Deleted

## 2018-11-23 DIAGNOSIS — E119 Type 2 diabetes mellitus without complications: Secondary | ICD-10-CM

## 2018-11-26 ENCOUNTER — Other Ambulatory Visit: Payer: Self-pay | Admitting: *Deleted

## 2018-11-30 ENCOUNTER — Encounter: Payer: Self-pay | Admitting: Family Medicine

## 2018-12-03 ENCOUNTER — Ambulatory Visit: Payer: Self-pay | Admitting: Orthopedic Surgery

## 2018-12-22 ENCOUNTER — Telehealth: Payer: Self-pay | Admitting: Gastroenterology

## 2018-12-22 ENCOUNTER — Encounter: Payer: Self-pay | Admitting: Gastroenterology

## 2018-12-22 ENCOUNTER — Ambulatory Visit (INDEPENDENT_AMBULATORY_CARE_PROVIDER_SITE_OTHER): Payer: Medicare Other | Admitting: Gastroenterology

## 2018-12-22 ENCOUNTER — Other Ambulatory Visit: Payer: Self-pay

## 2018-12-22 DIAGNOSIS — Z1211 Encounter for screening for malignant neoplasm of colon: Secondary | ICD-10-CM | POA: Insufficient documentation

## 2018-12-22 LAB — HM DIABETES EYE EXAM

## 2018-12-22 NOTE — Patient Instructions (Signed)
1. As discussed today, since you want to wait until Covid is better and your PCP recommends waiting, we have cancelled today's appointment and will hold off on scheduling a colonoscopy. 2. Please call us at [phone], when you are ready to schedule.

## 2018-12-22 NOTE — Telephone Encounter (Signed)
Please make NIC for colonoscopy recall in 04/2019. Patient did not want to pursue now due to [place].

## 2018-12-22 NOTE — Progress Notes (Addendum)
Patient presented to for office visit today but did not realize it was to schedule a colonoscopy. She states her PCP has advised she wait for now due to Covid and given that she is not having any problems. Patient confirmed no GI issues. We offered to cancel today's appointment, which she agreed with.   She will call when she is ready to schedule colonoscopy.

## 2018-12-22 NOTE — Telephone Encounter (Signed)
ON RECALL  °

## 2018-12-29 ENCOUNTER — Ambulatory Visit: Payer: Medicare Other | Admitting: Orthopedic Surgery

## 2018-12-31 ENCOUNTER — Other Ambulatory Visit: Payer: Self-pay | Admitting: Family Medicine

## 2019-01-12 ENCOUNTER — Ambulatory Visit: Payer: Medicare Other

## 2019-01-12 ENCOUNTER — Ambulatory Visit (INDEPENDENT_AMBULATORY_CARE_PROVIDER_SITE_OTHER): Payer: Medicare Other | Admitting: Orthopedic Surgery

## 2019-01-12 ENCOUNTER — Other Ambulatory Visit: Payer: Self-pay

## 2019-01-12 VITALS — BP 144/72 | HR 88 | Ht 67.0 in | Wt 289.0 lb

## 2019-01-12 DIAGNOSIS — M1712 Unilateral primary osteoarthritis, left knee: Secondary | ICD-10-CM

## 2019-01-12 DIAGNOSIS — Z96651 Presence of right artificial knee joint: Secondary | ICD-10-CM

## 2019-01-12 DIAGNOSIS — M25561 Pain in right knee: Secondary | ICD-10-CM

## 2019-01-12 DIAGNOSIS — G8929 Other chronic pain: Secondary | ICD-10-CM

## 2019-01-12 DIAGNOSIS — Z6841 Body Mass Index (BMI) 40.0 and over, adult: Secondary | ICD-10-CM

## 2019-01-12 DIAGNOSIS — Z96652 Presence of left artificial knee joint: Secondary | ICD-10-CM

## 2019-01-12 NOTE — Patient Instructions (Addendum)
Your implants look very good they are surviving well  You have some tendinitis in your patella tendon I recommend the following creams these are the muscle and arthrits creams I recommend:  PLEASE READ THE PACKAGE INSTRUCTIONS BEFORE USING   Ben Gay arthritis cream  Icy hot vanishing gel  Aspercreme odor free  Myoflex Oderless pain reliever  Capzasin  Sportscreme  Max freeze

## 2019-01-12 NOTE — Progress Notes (Signed)
Annual follow-up bilateral total knees  Chief Complaint  Patient presents with  . Routine Post Op    left knee replacement 8.20.12   . Knee Pain    right knee painful    Left total knee 2012 right total knee 2007  Complains of pain front of right knee  Otherwise doing well no pain no problems with the knees she does complain of some right hip pain would like an evaluation which we will schedule in 2 months  She walks independently she has excellent flexion in the left and right knee with good extension and normal extension  BP (!) 144/72   Pulse 88   Ht 5\' 7"  (1.702 m)   Wt 289 lb (131.1 kg)   BMI 45.26 kg/m   X-rays were done today she has 2 stable knees with good alignment no signs of loosening  Recommend follow-up in 2 months to x-ray her right hip for right hip pain  The patient meets the AMA guidelines for Morbid (severe) obesity with a BMI > 40.0 and I have recommended weight loss.  Encounter Diagnoses  Name Primary?  . 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 Chronic pain of right knee   . Unilateral primary osteoarthritis, left knee   . S/P total knee replacement, right 2007   . Body mass index 45.0-49.9, adult ([place])   . Morbid obesity ([place])

## 2019-01-19 ENCOUNTER — Encounter: Payer: Medicare Other | Attending: Family Medicine | Admitting: Nutrition

## 2019-01-19 ENCOUNTER — Encounter: Payer: Self-pay | Admitting: "Endocrinology

## 2019-01-19 ENCOUNTER — Ambulatory Visit: Payer: Medicare Other | Admitting: "Endocrinology

## 2019-01-19 ENCOUNTER — Encounter: Payer: Self-pay | Admitting: Nutrition

## 2019-01-19 ENCOUNTER — Other Ambulatory Visit: Payer: Self-pay

## 2019-01-19 VITALS — BP 115/78 | HR 77 | Ht 67.0 in | Wt 289.6 lb

## 2019-01-19 DIAGNOSIS — E782 Mixed hyperlipidemia: Secondary | ICD-10-CM

## 2019-01-19 DIAGNOSIS — I10 Essential (primary) hypertension: Secondary | ICD-10-CM | POA: Diagnosis not present

## 2019-01-19 DIAGNOSIS — E1165 Type 2 diabetes mellitus with hyperglycemia: Secondary | ICD-10-CM

## 2019-01-19 MED ORDER — LEVEMIR FLEXTOUCH 100 UNIT/ML ~~LOC~~ SOPN: 60.0000 [IU] | PEN_INJECTOR | Freq: Every day | SUBCUTANEOUS | 1 refills | Status: DC

## 2019-01-19 NOTE — Progress Notes (Signed)
  Medical Nutrition Therapy:  Appt start time: 1100 end time:  1200.   Assessment:  Primary concerns today: DM Type 2, Obesity. Walk in from DR. Nida. Will do safety question next visit. A1C13.9%. Lives by herself. Admits to drinking soda, candy, sweets and larger portions. Willing to make changes with diet to improve DM. Levemir increased to 60 units today and Metformin 1000 mg BID per Dr. Dorris Fetch today.  CMP Latest Ref Rng & Units 11/18/2018 02/04/2018 11/04/2017  Glucose 65 - 99 mg/dL 359(H) 196(H) 221(H)  BUN 8 - 27 mg/dL 13 10 11   Creatinine 0.57 - 1.00 mg/dL 0.99 0.83 0.85  Sodium 134 - 144 mmol/L 135 138 140  Potassium 3.5 - 5.2 mmol/L 4.0 4.6 4.7  Chloride 96 - 106 mmol/L 95(L) 97 99  CO2 20 - 29 mmol/L 25 25 26   Calcium 8.7 - 10.3 mg/dL 9.7 9.7 10.2  Total Protein 6.0 - 8.5 g/dL 7.8 - -  Total Bilirubin 0.0 - 1.2 mg/dL 0.3 - -  Alkaline Phos 39 - 117 IU/L 195(H) - -  AST 0 - 40 IU/L 14 - -  ALT 0 - 32 IU/L 15 - -   Lab Results  Component Value Date   HGBA1C 13.9 (H) 11/18/2018     Preferred Learning Style:   No preference indicated   Learning Readiness:   Ready  Change in progress   MEDICATIONS:    DIETARY INTAKE:   24-hr recall:  B ( 7a m AM):  Eggs, 2 slice white, coffee and juice,water, 8 ooz juice Snk ( AM):  L ( PM): MCMac, fries and soda-Sprite, Snk ( PM): orange D ( PM): baked potato,  Snk ( PM): snacks Beverages: soda, juice  Usual physical activity: adl  Estimated energy needs: 1500  calories 170 g carbohydrates 112 g protein 42 g fat  Progress Towards Goal(s):  In progress.   Nutritional Diagnosis:  NB-1.1 Food and nutrition-related knowledge deficit As related to Diabetes Type 2.  As evidenced by A1C 13.9%.    Intervention:  Nutrition and Diabetes education provided on My Plate, CHO counting, meal planning, portion sizes, timing of meals, avoiding snacks between meals unless having a low blood sugar, target ranges for A1C and blood  sugars, signs/symptoms and treatment of hyper/hypoglycemia, monitoring blood sugars, taking medications as prescribed, benefits of exercising 30 minutes per day and prevention of complications of DM.  Goals Follow My Plate Eat three meals per day at times dicussed Cut out soda, junk food, sweets, juice and processed foods Increase fresh fruits and vegetables. Drink only water-gallon a day Take medications as prescribed. Test blood sugars 4 times per day.. Get A1C down to 7% in 3-6 months. Lose 1 lb per week.  Teaching Method Utilized:  Visual Auditory Hands on  Handouts given during visit include:  The Plate Method   Meal Plan  Diabetes Instructions.   Barriers to learning/adherence to lifestyle change: none  Demonstrated degree of understanding via:  Teach Back   Monitoring/Evaluation:  Dietary intake, exercise, , and body weight in 10 days. Marland Kitchen

## 2019-01-19 NOTE — Progress Notes (Signed)
Endocrinology Consult Note       01/19/2019, 1:44 PM   Subjective:    Patient ID: [name], female    DOB: [birth year].  [name] is being seen in consultation for management of currently uncontrolled symptomatic diabetes requested by  Kathyrn Drown, MD.   Past Medical History:  Diagnosis Date  . Arthritis   . Essential hypertension   . Finger amputation, traumatic Age 69   [place]  . Hyperlipidemia   . Obesity   . PONV (postoperative nausea and vomiting)   . Type 2 diabetes mellitus ([place])   . Type 2 diabetes mellitus with diabetic neuropathy, unspecified ([place]) 02/04/2018    Past Surgical History:  Procedure Laterality Date  . ABDOMINAL HYSTERECTOMY    . COLONOSCOPY  12/2008   Dr. Oneida Alar: slightly tortuous colon. internal hemorrhoids. next colonoscopy in 10 years.   Marland Kitchen HERNIA REPAIR     x2  . OOPHORECTOMY    . REPLACEMENT TOTAL KNEE Right   . TOTAL HIP ARTHROPLASTY Left 2008?   [facility], [place]  . TOTAL KNEE ARTHROPLASTY  09/09/2010   Procedure: TOTAL KNEE ARTHROPLASTY;  Surgeon: Arther Abbott, MD;  Location: [facility];  Service: Orthopedics;  Laterality: Left;  With [employer]    Social History   Socioeconomic History  . Marital status: Single    Spouse name: Not on file  . Number of children: Not on file  . Years of education: Not on file  . Highest education level: Not on file  Occupational History  . Not on file  Tobacco Use  . Smoking status: Never Smoker  . Smokeless tobacco: Never Used  Substance and Sexual Activity  . Alcohol use: No  . Drug use: Not on file  . Sexual activity: Not on file  Other Topics Concern  . Not on file  Social History Narrative  . Not on file   Social Determinants of Health   Financial Resource Strain:   . Difficulty of Paying Living Expenses: Not on file  Food Insecurity:   . Worried About Charity fundraiser in the Last Year: Not on file  .  Ran Out of Food in the Last Year: Not on file  Transportation Needs:   . Lack of Transportation (Medical): Not on file  . Lack of Transportation (Non-Medical): Not on file  Physical Activity:   . Days of Exercise per Week: Not on file  . Minutes of Exercise per Session: Not on file  Stress:   . Feeling of Stress : Not on file  Social Connections:   . Frequency of Communication with Friends and Family: Not on file  . Frequency of Social Gatherings with Friends and Family: Not on file  . Attends Religious Services: Not on file  . Active Member of Clubs or Organizations: Not on file  . Attends Archivist Meetings: Not on file  . Marital Status: Not on file    Family History  Problem Relation Age of Onset  . Arthritis Other   . Heart attack Sister     Outpatient Encounter Medications as of 01/19/2019  Medication Sig  .  aspirin 81 MG tablet Take 81 mg by mouth daily.  . Cinnamon 500 MG capsule Take 500 mg by mouth daily.  . Flaxseed, Linseed, (FLAX SEED OIL PO) Take 3 capsules by mouth daily.   . furosemide (LASIX) 20 MG tablet TAKE 1 TO 2 TABLETS BY MOUTH ONCE DAILY IN THE MORNING AS NEEDED FOR  PEDAL  EDEMA  . gabapentin (NEURONTIN) 100 MG capsule Take 3 capsules by mouth in the morning; take 2 tablets by mouth mid day; and take 3 tablets by mouth in the evening.  . Insulin Detemir (LEVEMIR FLEXTOUCH) 100 UNIT/ML Pen Inject 60 Units into the skin at bedtime.  . metFORMIN (GLUCOPHAGE) 500 MG tablet Take 2 tablets (1,000 mg total) by mouth 2 (two) times daily with a meal.  . Multiple Minerals-Vitamins (BONE ESSENTIALS PO) Take 1 tablet by mouth daily.  Glory Rosebush VERIO test strip USE 1 STRIP TO CHECK GLUCOSE UP TO THREE TIMES DAILY  . potassium chloride (KLOR-CON) 10 MEQ tablet 1 or 2 each am as directed  . RELION PEN NEEDLES 32G X 4 MM MISC USE AS DIRECTED  . rosuvastatin (CRESTOR) 40 MG tablet Take 1 tablet (40 mg total) by mouth daily.  . [DISCONTINUED] LEVEMIR FLEXTOUCH  100 UNIT/ML Pen INJECT 46 UNITS SUBCUTANEOUSLY AFTER BREAKFAST   No facility-administered encounter medications on file as of 01/19/2019.    ALLERGIES: Allergies  Allergen Reactions  . Penicillins Itching    Has patient had a PCN reaction causing immediate rash, facial/tongue/throat swelling, SOB or lightheadedness with hypotension: No Has patient had a PCN reaction causing severe rash involving mucus membranes or skin necrosis: No Has patient had a PCN reaction that required hospitalization: No Has patient had a PCN reaction occurring within the last 10 years: No If all of the above answers are "NO", then may proceed with Cephalosporin use.   . Tramadol Itching  . Hydrocodone Itching    VACCINATION STATUS: Immunization History  Administered Date(s) Administered  . Influenza Split 10/04/2012, 09/13/2013  . Influenza,inj,Quad PF,6+ Mos 09/22/2014, 10/05/2015, 11/04/2017  . Influenza-Unspecified 09/14/2018  . Pneumococcal Conjugate-13 04/24/2016  . Pneumococcal Polysaccharide-23 06/14/2013  . Td 06/14/2013  . Zoster Recombinat (Shingrix) 02/03/2018    Diabetes She presents for her initial diabetic visit. She has type 2 diabetes mellitus. Onset time: She was diagnosed at approximate age of 48 years. Her disease course has been worsening. There are no hypoglycemic associated symptoms. Pertinent negatives for hypoglycemia include no confusion, headaches, pallor or seizures. Associated symptoms include fatigue, polydipsia and polyuria. Pertinent negatives for diabetes include no chest pain and no polyphagia. There are no hypoglycemic complications. Symptoms are worsening. There are no diabetic complications. Risk factors for coronary artery disease include dyslipidemia, diabetes mellitus, obesity, hypertension, sedentary lifestyle and post-menopausal. Current diabetic treatment includes insulin injections (She is currently taking Levemir 52 units nightly, Metformin 1000 mg p.o. twice daily.).  Her weight is fluctuating minimally. She is following a generally unhealthy diet. When asked about meal planning, she reported none. She has not had a previous visit with a dietitian. She never participates in exercise. Her home blood glucose trend is increasing steadily. (She did not bring any logs nor meter to review today.  Her A1c in November was 13.9%. ) An ACE inhibitor/angiotensin II receptor blocker is not being taken. Eye exam is current.  Hyperlipidemia This is a chronic problem. The current episode started more than 1 year ago. The problem is uncontrolled. Recent lipid tests were reviewed and are high. Exacerbating  diseases include diabetes and obesity. Pertinent negatives include no chest pain, myalgias or shortness of breath. Current antihyperlipidemic treatment includes statins. Risk factors for coronary artery disease include diabetes mellitus, dyslipidemia, hypertension, obesity, a sedentary lifestyle and post-menopausal.  Hypertension This is a chronic problem. The current episode started more than 1 year ago. The problem is controlled. Pertinent negatives include no chest pain, headaches, palpitations or shortness of breath. Risk factors for coronary artery disease include diabetes mellitus, dyslipidemia, obesity, post-menopausal state and sedentary lifestyle. Past treatments include diuretics.     Review of Systems  Constitutional: Positive for fatigue. Negative for chills, fever and unexpected weight change.  HENT: Negative for trouble swallowing and voice change.   Eyes: Negative for visual disturbance.  Respiratory: Negative for cough, shortness of breath and wheezing.   Cardiovascular: Negative for chest pain, palpitations and leg swelling.  Gastrointestinal: Negative for diarrhea, nausea and vomiting.  Endocrine: Positive for polydipsia and polyuria. Negative for cold intolerance, heat intolerance and polyphagia.  Musculoskeletal: Negative for arthralgias and myalgias.   Skin: Negative for color change, pallor, rash and wound.  Neurological: Negative for seizures and headaches.  Psychiatric/Behavioral: Negative for confusion and suicidal ideas.    Objective:    BP 115/78   Pulse 77   Ht 5\' 7"  (1.702 m)   Wt 289 lb 9.6 oz (131.4 kg)   BMI 45.36 kg/m   Wt Readings from Last 3 Encounters:  01/19/19 289 lb 9.6 oz (131.4 kg)  01/12/19 289 lb (131.1 kg)  12/22/18 287 lb 3.2 oz (130.3 kg)     Physical Exam Constitutional:      Appearance: She is well-developed.  HENT:     Head: Normocephalic and atraumatic.  Neck:     Thyroid: No thyromegaly.     Trachea: No tracheal deviation.  Cardiovascular:     Rate and Rhythm: Normal rate.  Pulmonary:     Effort: Pulmonary effort is normal.  Abdominal:     General: Bowel sounds are normal.     Palpations: Abdomen is soft.     Tenderness: There is no abdominal tenderness. There is no guarding.  Musculoskeletal:        General: Normal range of motion.     Cervical back: Normal range of motion and neck supple.  Skin:    General: Skin is warm and dry.     Coloration: Skin is not pale.     Findings: No erythema or rash.  Neurological:     Mental Status: She is alert and oriented to person, place, and time.     Cranial Nerves: No cranial nerve deficit.     Coordination: Coordination normal.     Deep Tendon Reflexes: Reflexes are normal and symmetric.  Psychiatric:        Judgment: Judgment normal.      CMP     Component Value Date/Time   NA 135 11/18/2018 0808   K 4.0 11/18/2018 0808   CL 95 (L) 11/18/2018 0808   CO2 25 11/18/2018 0808   GLUCOSE 359 (H) 11/18/2018 0808   GLUCOSE 140 (H) 07/03/2015 0800   BUN 13 11/18/2018 0808   CREATININE 0.99 11/18/2018 0808   CREATININE 0.84 12/27/2013 0710   CALCIUM 9.7 11/18/2018 0808   PROT 7.8 11/18/2018 0808   ALBUMIN 4.2 11/18/2018 0808   AST 14 11/18/2018 0808   ALT 15 11/18/2018 0808   ALKPHOS 195 (H) 11/18/2018 0808   BILITOT 0.3 11/18/2018  0808   GFRNONAA 59 (L) 11/18/2018  [id number]   GFRAA 68 11/18/2018 0808     Diabetic Labs (most recent): Lab Results  Component Value Date   HGBA1C 13.9 (H) 11/18/2018   HGBA1C 7.5 (A) 02/04/2018   HGBA1C 9.9 (A) 11/04/2017     Lipid Panel ( most recent) Lipid Panel     Component Value Date/Time   CHOL 260 (H) 11/18/2018 0808   TRIG 137 11/18/2018 0808   HDL 52 11/18/2018 0808   CHOLHDL 5.0 (H) 11/18/2018 0808   CHOLHDL 4.6 12/27/2013 0710   VLDL 22 12/27/2013 0710   LDLCALC 183 (H) 11/18/2018 0808   LABVLDL 25 11/18/2018 0808     Assessment & Plan:   1. Uncontrolled type 2 diabetes mellitus with hyperglycemia ([facility])  - Ryna M Cotterman has currently uncontrolled symptomatic type 2 DM since  69 years of age,  with most recent A1c of 13.9 %. Recent labs reviewed. - I had a long discussion with her about the progressive nature of diabetes and the pathology behind its complications. -her diabetes is complicated by obesity/sedentary life and she remains at a high risk for more acute and chronic complications which include CAD, CVA, CKD, retinopathy, and neuropathy. These are all discussed in detail with her.  - I have counseled her on diet  and weight management  by adopting a carbohydrate restricted/protein rich diet. Patient is encouraged to switch to  unprocessed or minimally processed     complex starch and increased protein intake (animal or plant source), fruits, and vegetables. -  she is advised to stick to a routine mealtimes to eat 3 meals  a day and avoid unnecessary snacks ( to snack only to correct hypoglycemia).   - she admits that there is a room for improvement in her food and drink choices. - Suggestion is made for her to avoid simple carbohydrates  from her diet including Cakes, Sweet Desserts, Ice Cream, Soda (diet and regular), Sweet Tea, Candies, Chips, Cookies, Store Bought Juices, Alcohol in Excess of  1-2 drinks a day, Artificial Sweeteners,  Coffee Creamer, and  "Sugar-free" Products. This will help patient to have more stable blood glucose profile and potentially avoid unintended weight gain.  - she will be scheduled with Jearld Fenton, RDN, CDE for diabetes education.  - I have approached her with the following individualized plan to manage  her diabetes and patient agrees:   -Given her current glycemic burden, she may require intensive treatment with basal/bolus insulin in order for her to achieve and maintain control of diabetes to target. -In preparation, she is advised to maximize her basal insulin, Levemir to 60 units nightly, associated with strict monitoring of glucose 4 times a day-before meals and at bedtime. - she is warned not to take insulin without proper monitoring per orders. -If she continues to have significantly above target postprandial glycemic profile, she will be considered for prandial insulin. - she is encouraged to call clinic for blood glucose levels less than 70 or above 300 mg /dl. - she is advised to continue Metformin 1000 mg p.o. twice daily, therapeutically suitable for patient .  - she will be considered for incretin therapy as appropriate next visit.  - Specific targets for  A1c;  LDL, HDL,  and Triglycerides were discussed with the patient.  2) Blood Pressure /Hypertension:  her blood pressure is  controlled to target.   she is advised to continue her current medications including Lasix as needed.  3) Lipids/Hyperlipidemia:   Review of her recent lipid panel  showed uncontrolled  LDL at 183 .  she  is advised to continue    Crestor 40 mg daily at bedtime.  Side effects and precautions discussed with her.  4)  Weight/Diet:  Body mass index is 45.36 kg/m.  -   clearly complicating her diabetes care.   she is  a candidate for weight loss. I discussed with her the fact that loss of 5 - 10% of her  current body weight will have the most impact on her diabetes management.  Exercise, and detailed carbohydrates information  provided  -  detailed on discharge instructions.  5) Chronic Care/Health Maintenance:  -she  Is on Statin medications and  is encouraged to initiate and continue to follow up with Ophthalmology, Dentist,  Podiatrist at least yearly or according to recommendations, and advised to   stay away from smoking. I have recommended yearly flu vaccine and pneumonia vaccine at least every 5 years; moderate intensity exercise for up to 150 minutes weekly; and  sleep for at least 7 hours a day.  - she is  advised to maintain close follow up with Kathyrn Drown, MD for primary care needs, as well as her other providers for optimal and coordinated care.   - Time spent in this patient care: 55 min, of which > 50% was spent in  counseling  her about  her currently uncontrolled type 2 diabetes, hyperlipidemia, hypertension, obesity/sedentary life and the rest reviewing her blood glucose logs , discussing her hypoglycemia and hyperglycemia episodes, reviewing her current and  previous labs / studies  ( including abstraction from other facilities) and medications  doses and developing a  long term treatment plan based on the latest standards of care/ guidelines; and documenting her care.    Please refer to Patient Instructions for Blood Glucose Monitoring and Insulin/Medications Dosing Guide"  in media tab for additional information. Please  also refer to " Patient Self Inventory" in the Media  tab for reviewed elements of pertinent patient history.  Sumie Edythe Murray participated in the discussions, expressed understanding, and voiced agreement with the above plans.  All questions were answered to her satisfaction. she is encouraged to contact clinic should she have any questions or concerns prior to her return visit.   Follow up plan: - Return in about 10 days (around 01/29/2019), or office, for Follow up with Meter and Logs Only - no Labs.  Glade Lloyd, MD [facility] [address] [place], [place] [address] Phone: [phone]  Fax: [phone]    01/19/2019, 1:44 PM  This note was partially dictated with voice recognition software. Similar sounding words can be transcribed inadequately or may not  be corrected upon review.

## 2019-01-19 NOTE — Patient Instructions (Signed)

## 2019-01-19 NOTE — Patient Instructions (Signed)
Goals Follow My Plate Eat three meals per day at times dicussed Cut out soda, junk food, sweets, juice and processed foods Increase fresh fruits and vegetables. Drink only water-gallon a day Take medications as prescribed. Test blood sugars 4 times per day.. Get A1C down to 7% in 3-6 months. Lose 1 lb per week.

## 2019-01-21 ENCOUNTER — Other Ambulatory Visit: Payer: Self-pay | Admitting: Family Medicine

## 2019-02-03 ENCOUNTER — Encounter: Payer: Self-pay | Admitting: Nutrition

## 2019-02-03 ENCOUNTER — Encounter: Payer: Self-pay | Admitting: "Endocrinology

## 2019-02-03 ENCOUNTER — Ambulatory Visit: Payer: Medicare Other | Admitting: "Endocrinology

## 2019-02-03 ENCOUNTER — Other Ambulatory Visit: Payer: Self-pay

## 2019-02-03 ENCOUNTER — Encounter: Payer: Medicare Other | Attending: "Endocrinology | Admitting: Nutrition

## 2019-02-03 VITALS — Ht 67.0 in | Wt 294.0 lb

## 2019-02-03 VITALS — BP 124/73 | HR 77 | Ht 67.0 in | Wt 294.0 lb

## 2019-02-03 DIAGNOSIS — E1165 Type 2 diabetes mellitus with hyperglycemia: Secondary | ICD-10-CM

## 2019-02-03 DIAGNOSIS — E782 Mixed hyperlipidemia: Secondary | ICD-10-CM

## 2019-02-03 LAB — POCT GLYCOSYLATED HEMOGLOBIN (HGB A1C): Hemoglobin A1C: 13.5 % — AB (ref 4.0–5.6)

## 2019-02-03 MED ORDER — LEVEMIR FLEXTOUCH 100 UNIT/ML ~~LOC~~ SOPN: 80.0000 [IU] | PEN_INJECTOR | Freq: Every day | SUBCUTANEOUS | 1 refills | Status: DC

## 2019-02-03 NOTE — Patient Instructions (Signed)
Goals   Get morning blood sugars to 150 mg or les and less than 180 before bed.  Test blood sugars twice a day; before breakfast and bedtime   Breakfast 1 egg, 1 packet oatmeal and 1 fruit-cut bacon, sausage and biscuits  Lunch: spinach salad with baked chicken,  1/2 baked potato, And piece fruit- water OR   Deli meat on whole wheat bread, 1 c salad or cuke cumbers/toamtoes/onions, 1 piece of fruit, water  5-6 bottles of water per day  Eat more fresh fruits and vegetables. No snacks Walk when you can.

## 2019-02-03 NOTE — Progress Notes (Signed)
Medical Nutrition Therapy:  Appt start time: 0915  end time: 0845  Assessment:  Primary concerns today: DM Type 2, Obesity. Walk in from DR. Nida. Gained 5 lbs.a1c 13.5%, Levermir  Was 60 units twice a day and  Now going up to 80 units twice a day. No changes in A1C since last visit. Has been checking blood sugars 2-3 times a day. Admits to eating sweets at times. Diet is low in fresh fruits, vegetables and whole grains! Sometimes forgets her insulin at night. Willing to work on getting junk food out of house.  Meter shows BS 185-272 mg/dl in am 160-290 mg/dl before lunch 208-294 mg/dl  before dinner 200-252 mg/dl at bedtime.   Lab Results  Component Value Date   HGBA1C 13.5 (A) 02/03/2019    CMP Latest Ref Rng & Units 11/18/2018 02/04/2018 11/04/2017  Glucose 65 - 99 mg/dL 359(H) 196(H) 221(H)  BUN 8 - 27 mg/dL 13 10 11   Creatinine 0.57 - 1.00 mg/dL 0.99 0.83 0.85  Sodium 134 - 144 mmol/L 135 138 140  Potassium 3.5 - 5.2 mmol/L 4.0 4.6 4.7  Chloride 96 - 106 mmol/L 95(L) 97 99  CO2 20 - 29 mmol/L 25 25 26   Calcium 8.7 - 10.3 mg/dL 9.7 9.7 10.2  Total Protein 6.0 - 8.5 g/dL 7.8 - -  Total Bilirubin 0.0 - 1.2 mg/dL 0.3 - -  Alkaline Phos 39 - 117 IU/L 195(H) - -  AST 0 - 40 IU/L 14 - -  ALT 0 - 32 IU/L 15 - -   Lab Results  Component Value Date   HGBA1C 13.5 (A) 02/03/2019     Preferred Learning Style:   No preference indicated   Learning Readiness:   Ready  Change in progress   MEDICATIONS:    DIETARY INTAKE:   24-hr recall:  B ( 7a m AM): 1 packet  buttered Grits, bacon 2 slices , egg 1 ,water. Coffee-creamer, peaches lite syrup Snk ( AM):  L ( PM): Salad-bacon bits,  With [place] Snk ( PM): D ( PM) Chicken-leg and wing, baked, water Snk ( PM): gum 1 slice-  Usual physical activity: ADL   Estimated energy needs: 1500  calories 170 g carbohydrates 112 g protein 42 g fat  Progress Towards Goal(s):  In progress.   Nutritional Diagnosis:   NB-1.1 Food and nutrition-related knowledge deficit As related to Diabetes Type 2.  As evidenced by A1C 13.9%.    Intervention:  Nutrition and Diabetes education provided on My Plate, CHO counting, meal planning, portion sizes, timing of meals, avoiding snacks between meals unless having a low blood sugar, target ranges for A1C and blood sugars, signs/symptoms and treatment of hyper/hypoglycemia, monitoring blood sugars, taking medications as prescribed, benefits of exercising 30 minutes per day and prevention of complications of DM.   Goals   Get morning blood sugars to 150 mg or les and less than 180 before bed.  Test blood sugars twice a day; before breakfast and bedtime   Breakfast 1 egg, 1 packet oatmeal and 1 fruit-cut bacon, sausage and biscuits  Lunch: spinach salad with baked chicken,  1/2 baked potato, And piece fruit- water OR   Deli meat on whole wheat bread, 1 c salad or cuke cumbers/toamtoes/onions, 1 piece of fruit, water  5-6 bottles of water per day  Eat more fresh fruits and vegetables. No snacks Walk when you can.   Teaching Method Utilized:  Visual Auditory Hands on  Handouts given during visit include:  The Plate Method   Meal Plan  Diabetes Instructions.   Barriers to learning/adherence to lifestyle change: none  Demonstrated degree of understanding via:  Teach Back   Monitoring/Evaluation:  Dietary intake, exercise, , and body weight in 4 weeks. Marland Kitchen Marland Kitchen

## 2019-02-03 NOTE — Progress Notes (Signed)
02/03/2019, 9:18 AM  Endocrinology follow-up note   Subjective:    Patient ID: [name], female    DOB: November 06, 1950.  [name] is being seen in follow-up after she was seen in consultation for management of currently uncontrolled symptomatic diabetes requested by  Kathyrn Drown, MD.   Past Medical History:  Diagnosis Date  . Arthritis   . Essential hypertension   . Finger amputation, traumatic Age 69   [place]  . Hyperlipidemia   . Obesity   . PONV (postoperative nausea and vomiting)   . Type 2 diabetes mellitus ([place])   . Type 2 diabetes mellitus with diabetic neuropathy, unspecified ([place]) 02/04/2018    Past Surgical History:  Procedure Laterality Date  . ABDOMINAL HYSTERECTOMY    . COLONOSCOPY  12/2008   Dr. Oneida Alar: slightly tortuous colon. internal hemorrhoids. next colonoscopy in 10 years.   Marland Kitchen HERNIA REPAIR     x2  . OOPHORECTOMY    . REPLACEMENT TOTAL KNEE Right   . TOTAL HIP ARTHROPLASTY Left 2008?   [facility], [place]  . TOTAL KNEE ARTHROPLASTY  09/09/2010   Procedure: TOTAL KNEE ARTHROPLASTY;  Surgeon: Arther Abbott, MD;  Location: [facility];  Service: Orthopedics;  Laterality: Left;  With [employer]    Social History   Socioeconomic History  . Marital status: Single    Spouse name: Not on file  . Number of children: Not on file  . Years of education: Not on file  . Highest education level: Not on file  Occupational History  . Not on file  Tobacco Use  . Smoking status: Never Smoker  . Smokeless tobacco: Never Used  Substance and Sexual Activity  . Alcohol use: No  . Drug use: Never  . Sexual activity: Not on file  Other Topics Concern  . Not on file  Social History Narrative  . Not on file   Social Determinants of Health   Financial Resource Strain:   . Difficulty of Paying Living Expenses: Not on file  Food Insecurity:   . Worried About Charity fundraiser in  the Last Year: Not on file  . Ran Out of Food in the Last Year: Not on file  Transportation Needs:   . Lack of Transportation (Medical): Not on file  . Lack of Transportation (Non-Medical): Not on file  Physical Activity:   . Days of Exercise per Week: Not on file  . Minutes of Exercise per Session: Not on file  Stress:   . Feeling of Stress : Not on file  Social Connections:   . Frequency of Communication with Friends and Family: Not on file  . Frequency of Social Gatherings with Friends and Family: Not on file  . Attends Religious Services: Not on file  . Active Member of Clubs or Organizations: Not on file  . Attends Archivist Meetings: Not on file  . Marital Status: Not on file    Family History  Problem Relation Age of Onset  . Arthritis Other   . Heart attack Sister     Outpatient Encounter Medications as of 02/03/2019  Medication  Sig  . aspirin 81 MG tablet Take 81 mg by mouth daily.  . Cinnamon 500 MG capsule Take 500 mg by mouth daily.  . Flaxseed, Linseed, (FLAX SEED OIL PO) Take 3 capsules by mouth daily.   . furosemide (LASIX) 20 MG tablet TAKE 1 TO 2 TABLETS BY MOUTH ONCE DAILY IN THE MORNING AS NEEDED FOR  PEDAL  EDEMA  . gabapentin (NEURONTIN) 100 MG capsule Take 3 capsules by mouth in the morning; take 2 tablets by mouth mid day; and take 3 tablets by mouth in the evening.  . Insulin Detemir (LEVEMIR FLEXTOUCH) 100 UNIT/ML Pen Inject 80 Units into the skin at bedtime.  . Lancets (ONETOUCH DELICA PLUS 123XX123) MISC USE 1  TO CHECK GLUCOSE UP TO THREE TIMES DAILY  . metFORMIN (GLUCOPHAGE) 500 MG tablet Take 2 tablets (1,000 mg total) by mouth 2 (two) times daily with a meal.  . Multiple Minerals-Vitamins (BONE ESSENTIALS PO) Take 1 tablet by mouth daily.  Glory Rosebush VERIO test strip USE 1 STRIP TO CHECK GLUCOSE UP TO THREE TIMES DAILY  . potassium chloride (KLOR-CON) 10 MEQ tablet 1 or 2 each am as directed  . RELION PEN NEEDLES 32G X 4 MM MISC USE AS  DIRECTED  . rosuvastatin (CRESTOR) 40 MG tablet Take 1 tablet (40 mg total) by mouth daily.  . [DISCONTINUED] Insulin Detemir (LEVEMIR FLEXTOUCH) 100 UNIT/ML Pen Inject 60 Units into the skin at bedtime.   No facility-administered encounter medications on file as of 02/03/2019.    ALLERGIES: Allergies  Allergen Reactions  . Penicillins Itching    Has patient had a PCN reaction causing immediate rash, facial/tongue/throat swelling, SOB or lightheadedness with hypotension: No Has patient had a PCN reaction causing severe rash involving mucus membranes or skin necrosis: No Has patient had a PCN reaction that required hospitalization: No Has patient had a PCN reaction occurring within the last 10 years: No If all of the above answers are "NO", then may proceed with Cephalosporin use.   . Tramadol Itching  . Hydrocodone Itching    VACCINATION STATUS: Immunization History  Administered Date(s) Administered  . Influenza Split 10/04/2012, 09/13/2013  . Influenza,inj,Quad PF,6+ Mos 09/22/2014, 10/05/2015, 11/04/2017  . Influenza-Unspecified 09/14/2018  . Pneumococcal Conjugate-13 04/24/2016  . Pneumococcal Polysaccharide-23 06/14/2013  . Td 06/14/2013  . Zoster Recombinat (Shingrix) 02/03/2018    Diabetes She presents for her follow-up diabetic visit. She has type 2 diabetes mellitus. Onset time: She was diagnosed at approximate age of 24 years. Her disease course has been worsening. There are no hypoglycemic associated symptoms. Pertinent negatives for hypoglycemia include no confusion, headaches, pallor or seizures. Associated symptoms include fatigue, polydipsia and polyuria. Pertinent negatives for diabetes include no chest pain and no polyphagia. There are no hypoglycemic complications. Symptoms are worsening. There are no diabetic complications. Risk factors for coronary artery disease include dyslipidemia, diabetes mellitus, obesity, hypertension, sedentary lifestyle and  post-menopausal. Current diabetic treatment includes insulin injections (She is currently taking Levemir 52 units nightly, Metformin 1000 mg p.o. twice daily.). Her weight is increasing steadily. She is following a generally unhealthy diet. When asked about meal planning, she reported none. She has not had a previous visit with a dietitian. She never participates in exercise. Her home blood glucose trend is increasing steadily. Her breakfast blood glucose range is generally >200 mg/dl. Her lunch blood glucose range is generally >200 mg/dl. Her dinner blood glucose range is generally >200 mg/dl. Her bedtime blood glucose range is generally >200 mg/dl.  Her overall blood glucose range is >200 mg/dl. (She has an average blood glucose readings greater than 200 mg nightly and A1c of 13.5%, change from prior recent A1c of 13.9%.   She developed some type readings in her logs, however none of them were verified in her meter.  ) An ACE inhibitor/angiotensin II receptor blocker is not being taken. Eye exam is current.  Hyperlipidemia This is a chronic problem. The current episode started more than 1 year ago. The problem is uncontrolled. Recent lipid tests were reviewed and are high. Exacerbating diseases include diabetes and obesity. Pertinent negatives include no chest pain, myalgias or shortness of breath. Current antihyperlipidemic treatment includes statins. Risk factors for coronary artery disease include diabetes mellitus, dyslipidemia, hypertension, obesity, a sedentary lifestyle and post-menopausal.  Hypertension This is a chronic problem. The current episode started more than 1 year ago. The problem is controlled. Pertinent negatives include no chest pain, headaches, palpitations or shortness of breath. Risk factors for coronary artery disease include diabetes mellitus, dyslipidemia, obesity, post-menopausal state and sedentary lifestyle. Past treatments include diuretics.     Review of Systems   Constitutional: Positive for fatigue. Negative for chills, fever and unexpected weight change.  HENT: Negative for trouble swallowing and voice change.   Eyes: Negative for visual disturbance.  Respiratory: Negative for cough, shortness of breath and wheezing.   Cardiovascular: Negative for chest pain, palpitations and leg swelling.  Gastrointestinal: Negative for diarrhea, nausea and vomiting.  Endocrine: Positive for polydipsia and polyuria. Negative for cold intolerance, heat intolerance and polyphagia.  Musculoskeletal: Negative for arthralgias and myalgias.  Skin: Negative for color change, pallor, rash and wound.  Neurological: Negative for seizures and headaches.  Psychiatric/Behavioral: Negative for confusion and suicidal ideas.    Objective:    BP 124/73   Pulse 77   Ht 5\' 7"  (1.702 m)   Wt 294 lb (133.4 kg)   BMI 46.05 kg/m   Wt Readings from Last 3 Encounters:  02/03/19 294 lb (133.4 kg)  01/19/19 289 lb 9.6 oz (131.4 kg)  01/12/19 289 lb (131.1 kg)     Physical Exam Constitutional:      Appearance: She is well-developed.  HENT:     Head: Normocephalic and atraumatic.  Neck:     Thyroid: No thyromegaly.     Trachea: No tracheal deviation.  Cardiovascular:     Rate and Rhythm: Normal rate.  Pulmonary:     Effort: Pulmonary effort is normal.  Abdominal:     General: Bowel sounds are normal.     Palpations: Abdomen is soft.     Tenderness: There is no abdominal tenderness. There is no guarding.  Musculoskeletal:        General: Normal range of motion.     Cervical back: Normal range of motion and neck supple.  Skin:    General: Skin is warm and dry.     Coloration: Skin is not pale.     Findings: No erythema or rash.  Neurological:     Mental Status: She is alert and oriented to person, place, and time.     Cranial Nerves: No cranial nerve deficit.     Coordination: Coordination normal.     Deep Tendon Reflexes: Reflexes are normal and symmetric.   Psychiatric:        Judgment: Judgment normal.      CMP     Component Value Date/Time   NA 135 11/18/2018 0808   K 4.0 11/18/2018 0808   CL  95 (L) 11/18/2018 0808   CO2 25 11/18/2018 0808   GLUCOSE 359 (H) 11/18/2018 0808   GLUCOSE 140 (H) 07/03/2015 0800   BUN 13 11/18/2018 0808   CREATININE 0.99 11/18/2018 0808   CREATININE 0.84 12/27/2013 0710   CALCIUM 9.7 11/18/2018 0808   PROT 7.8 11/18/2018 0808   ALBUMIN 4.2 11/18/2018 0808   AST 14 11/18/2018 0808   ALT 15 11/18/2018 0808   ALKPHOS 195 (H) 11/18/2018 0808   BILITOT 0.3 11/18/2018 0808   GFRNONAA 59 (L) 11/18/2018 0808   GFRAA 68 11/18/2018 0808     Diabetic Labs (most recent): Lab Results  Component Value Date   HGBA1C 13.5 (A) 02/03/2019   HGBA1C 13.9 (H) 11/18/2018   HGBA1C 7.5 (A) 02/04/2018     Lipid Panel ( most recent) Lipid Panel     Component Value Date/Time   CHOL 260 (H) 11/18/2018 0808   TRIG 137 11/18/2018 0808   HDL 52 11/18/2018 0808   CHOLHDL 5.0 (H) 11/18/2018 0808   CHOLHDL 4.6 12/27/2013 0710   VLDL 22 12/27/2013 0710   LDLCALC 183 (H) 11/18/2018 0808   LABVLDL 25 11/18/2018 0808     Assessment & Plan:   1. Uncontrolled type 2 diabetes mellitus with hyperglycemia (HCC)  - Jerusalem M Furtak has currently uncontrolled symptomatic type 2 DM since  69 years of age,  with most recent A1c of 13.5 %.  She presents with slightly improving, however with still persistently above target glycemic profile. - I had a long discussion with her about the progressive nature of diabetes and the pathology behind its complications. -her diabetes is complicated by obesity/sedentary life and she remains at a high risk for more acute and chronic complications which include CAD, CVA, CKD, retinopathy, and neuropathy. These are all discussed in detail with her.  - I have counseled her on diet  and weight management  by adopting a carbohydrate restricted/protein rich diet. Patient is encouraged to switch  to  unprocessed or minimally processed     complex starch and increased protein intake (animal or plant source), fruits, and vegetables. -  she is advised to stick to a routine mealtimes to eat 3 meals  a day and avoid unnecessary snacks ( to snack only to correct hypoglycemia).   - she  admits there is a room for improvement in her diet and drink choices. -  Suggestion is made for her to avoid simple carbohydrates  from her diet including Cakes, Sweet Desserts / Pastries, Ice Cream, Soda (diet and regular), Sweet Tea, Candies, Chips, Cookies, Sweet Pastries,  Store Bought Juices, Alcohol in Excess of  1-2 drinks a day, Artificial Sweeteners, Coffee Creamer, and "Sugar-free" Products. This will help patient to have stable blood glucose profile and potentially avoid unintended weight gain.   - she will be scheduled with Jearld Fenton, RDN, CDE for diabetes education.  - I have approached her with the following individualized plan to manage  her diabetes and patient agrees:   -Given her current glycemic burden, she may require intensive treatment with basal/bolus insulin in order for her to achieve and maintain control of diabetes to target. -In preparation, she is advised to maximize her basal insulin, Levemir to 80 units nightly,  associated with strict monitoring of glucose 4 times a day-before meals and at bedtime. - she is warned not to take insulin without proper monitoring per orders. -If she continues to have significantly above target postprandial glycemic profile, she will be considered for  prandial insulin. - she is encouraged to call clinic for blood glucose levels less than 70 or above 300 mg /dl. - she is advised to continue Metformin 1000 mg p.o. twice daily, therapeutically suitable for patient .  - she will be considered for incretin therapy as appropriate next visit.  - Specific targets for  A1c;  LDL, HDL,  and Triglycerides were discussed with the patient.  2) Blood Pressure  /Hypertension:  her blood pressure is controlled to target.   she is advised to continue her current medications including Lasix as needed.  3) Lipids/Hyperlipidemia:   Review of her recent lipid panel showed uncontrolled  LDL at 183 .  she  is advised to continue Crestor 40 mg p.o. daily at bedtime. Side effects and precautions discussed with her.  4)  Weight/Diet:  Body mass index is 46.05 kg/m.  -   clearly complicating her diabetes care.   she is  a candidate for weight loss. I discussed with her the fact that loss of 5 - 10% of her  current body weight will have the most impact on her diabetes management.  Exercise, and detailed carbohydrates information provided  -  detailed on discharge instructions.  5) Chronic Care/Health Maintenance:  -she  Is on Statin medications and  is encouraged to initiate and continue to follow up with Ophthalmology, Dentist,  Podiatrist at least yearly or according to recommendations, and advised to   stay away from smoking. I have recommended yearly flu vaccine and pneumonia vaccine at least every 5 years; moderate intensity exercise for up to 150 minutes weekly; and  sleep for at least 7 hours a day.  - she is  advised to maintain close follow up with Kathyrn Drown, MD for primary care needs, as well as her other providers for optimal and coordinated care.   - Time spent on this patient care encounter:  35 min, of which > 50% was spent in  counseling and the rest reviewing her blood glucose logs , discussing her hypoglycemia and hyperglycemia episodes, reviewing her current and  previous labs / studies  ( including abstraction from other facilities) and medications  doses and developing a  long term treatment plan and documenting her care.   Please refer to Patient Instructions for Blood Glucose Monitoring and Insulin/Medications Dosing Guide"  in media tab for additional information. Please  also refer to " Patient Self Inventory" in the Media  tab for reviewed  elements of pertinent patient history.  Lylian Edythe Clarity participated in the discussions, expressed understanding, and voiced agreement with the above plans.  All questions were answered to her satisfaction. she is encouraged to contact clinic should she have any questions or concerns prior to her return visit.    Follow up plan: - Return in about 4 weeks (around 03/03/2019) for Follow up with Meter and Logs Only - no Labs.  Glade Lloyd, MD [facility] [address] [place], [place] [address] Phone: [phone]  Fax: [phone]    02/03/2019, 9:18 AM  This note was partially dictated with voice recognition software. Similar sounding words can be transcribed inadequately or may not  be corrected upon review.

## 2019-02-03 NOTE — Patient Instructions (Signed)
                                     Advice for Weight Management  -For most of us the best way to lose weight is by diet management. Generally speaking, diet management means consuming less calories intentionally which over time brings about progressive weight loss.  This can be achieved more effectively by restricting carbohydrate consumption to the minimum possible.  So, it is critically important to know your numbers: how much calorie you are consuming and how much calorie you need. More importantly, our carbohydrates sources should be unprocessed or minimally processed complex starch food items.   Sometimes, it is important to balance nutrition by increasing protein intake (animal or plant source), fruits, and vegetables.  -Sticking to a routine mealtime to eat 3 meals a day and avoiding unnecessary snacks is shown to have a big role in weight control. Under normal circumstances, the only time we lose real weight is when we are hungry, so allow hunger to take place- hunger means no food between meal times, only water.  It is not advisable to starve.   -It is better to avoid simple carbohydrates including: Cakes, Sweet Desserts, Ice Cream, Soda (diet and regular), Sweet Tea, Candies, Chips, Cookies, Store Bought Juices, Alcohol in Excess of  1-2 drinks a day, Artificial Sweeteners, Doughnuts, Coffee Creamers, "Sugar-free" Products, etc, etc.  This is not a complete list.....    -Consulting with certified diabetes educators is proven to provide you with the most accurate and current information on diet.  Also, you may be  interested in discussing diet options/exchanges , we can schedule a visit with Penny Crumpton, RDN, CDE for individualized nutrition education.  -Exercise: If you are able: 30 -60 minutes a day ,4 days a week, or 150 minutes a week.  The longer the better.  Combine stretch, strength, and aerobic activities.  If you were told in the past that you  have high risk for cardiovascular diseases, you may seek evaluation by your heart doctor prior to initiating moderate to intense exercise programs.                                  Additional Care Considerations for Diabetes   -Diabetes  is a chronic disease.  The most important care consideration is regular follow-up with your diabetes care provider with the goal being avoiding or delaying its complications and to take advantage of advances in medications and technology.    -Type 2 diabetes is known to coexist with other important comorbidities such as high blood pressure and high cholesterol.  It is critical to control not only the diabetes but also the high blood pressure and high cholesterol to minimize and delay the risk of complications including coronary artery disease, stroke, amputations, blindness, etc.    - Studies showed that people with diabetes will benefit from a class of medications known as ACE inhibitors and statins.  Unless there are specific reasons not to be on these medications, the standard of care is to consider getting one from these groups of medications at an optimal doses.  These medications are generally considered safe and proven to help protect the heart and the kidneys.    - People with diabetes are encouraged to initiate and maintain regular follow-up with eye doctors, foot doctors, dentists ,   and if necessary heart and kidney doctors.     - It is highly recommended that people with diabetes quit smoking or stay away from smoking, and get yearly  flu vaccine and pneumonia vaccine at least every 5 years.  One other important lifestyle recommendation is to ensure adequate sleep - at least 6-7 hours of uninterrupted sleep at night.  -Exercise: If you are able: 30 -60 minutes a day, 4 days a week, or 150 minutes a week.  The longer the better.  Combine stretch, strength, and aerobic activities.  If you were told in the past that you have high risk for cardiovascular  diseases, you may seek evaluation by your heart doctor prior to initiating moderate to intense exercise programs.     COVID-19 Vaccine Information can be found at: [email or web address] For questions related to vaccine distribution or appointments, please email [email or web address] or call [phone].        

## 2019-03-08 ENCOUNTER — Ambulatory Visit: Payer: Medicare Other | Admitting: "Endocrinology

## 2019-03-09 ENCOUNTER — Encounter: Payer: Self-pay | Admitting: Orthopedic Surgery

## 2019-03-09 ENCOUNTER — Ambulatory Visit: Payer: Medicare Other | Admitting: Orthopedic Surgery

## 2019-03-15 ENCOUNTER — Ambulatory Visit: Payer: Medicare Other | Attending: Internal Medicine

## 2019-03-15 DIAGNOSIS — Z23 Encounter for immunization: Secondary | ICD-10-CM | POA: Insufficient documentation

## 2019-03-15 NOTE — Progress Notes (Signed)
   Covid-19 Vaccination Clinic  Name:  [name]    MRN: [id number] DOB: 11-Dec-1950  03/15/2019  [name] was observed post Covid-19 immunization for 15 minutes without incident. She was provided with Vaccine Information Sheet and instruction to access the V-Safe system.   [name] was instructed to call 911 with any severe reactions post vaccine: Marland Kitchen Difficulty breathing  . Swelling of face and throat  . A fast heartbeat  . A bad rash all over body  . Dizziness and weakness   Immunizations Administered    Name Date Dose VIS Date Route   Moderna COVID-19 Vaccine 03/15/2019  5:24 PM 0.5 mL 12/14/2018 Intramuscular   Manufacturer: Moderna   Lot: [id number]   [place]:[id number]

## 2019-03-28 ENCOUNTER — Encounter: Payer: Self-pay | Admitting: Gastroenterology

## 2019-03-31 ENCOUNTER — Other Ambulatory Visit: Payer: Self-pay | Admitting: *Deleted

## 2019-03-31 MED ORDER — BLOOD GLUCOSE METER KIT: PACK | 5 refills | Status: DC

## 2019-04-12 ENCOUNTER — Ambulatory Visit: Payer: Medicare HMO | Attending: Internal Medicine

## 2019-04-12 DIAGNOSIS — Z23 Encounter for immunization: Secondary | ICD-10-CM

## 2019-04-12 NOTE — Progress Notes (Signed)
   Covid-19 Vaccination Clinic  Name:  [name]    MRN: [id number] DOB: 1950-10-10  04/12/2019  [name] was observed post Covid-19 immunization for 15 minutes without incident. She was provided with Vaccine Information Sheet and instruction to access the V-Safe system.   [name] was instructed to call 911 with any severe reactions post vaccine: Marland Kitchen Difficulty breathing  . Swelling of face and throat  . A fast heartbeat  . A bad rash all over body  . Dizziness and weakness   Immunizations Administered    Name Date Dose VIS Date Route   Moderna COVID-19 Vaccine 04/12/2019  9:51 AM 0.5 mL 12/14/2018 Intramuscular   Manufacturer: Moderna   Lot: [id number]   [place]:[id number]

## 2019-04-15 ENCOUNTER — Other Ambulatory Visit: Payer: Self-pay | Admitting: "Endocrinology

## 2019-05-25 ENCOUNTER — Ambulatory Visit: Payer: Medicare HMO

## 2019-05-25 ENCOUNTER — Encounter: Payer: Self-pay | Admitting: Orthopedic Surgery

## 2019-05-25 ENCOUNTER — Ambulatory Visit: Payer: Medicare HMO | Admitting: Orthopedic Surgery

## 2019-05-25 ENCOUNTER — Other Ambulatory Visit: Payer: Self-pay

## 2019-05-25 VITALS — Temp 96.6°F | Ht 67.0 in | Wt 298.0 lb

## 2019-05-25 DIAGNOSIS — M25551 Pain in right hip: Secondary | ICD-10-CM | POA: Diagnosis not present

## 2019-05-25 MED ORDER — MELOXICAM 7.5 MG PO TABS: 7.5000 mg | ORAL_TABLET | Freq: Every day | ORAL | 5 refills | Status: DC

## 2019-05-25 NOTE — Patient Instructions (Signed)
Apply heat to area for 20 min 3 times a day    Start new medication

## 2019-05-25 NOTE — Progress Notes (Signed)
Chief Complaint  Patient presents with  . Hip Pain    Right hip pain   69 year old female complains of pain in her right buttock since a full 26 she also had some left leg radicular symptoms at the time  She had a left total hip she says it feels similar  Review of systems back pain with left leg pain nothing current pain is on the right buttock on the lateral side of it.  She has normal hip flexion with some obligatory external rotation no groin pain leg lengths are equal tenderness is in the right cheek  X-ray shows osteoarthritis right hip with osteophytes superiorly and inferiorly some central penetration of the head towards the acetabulum but normal joint space superiorly  Encounter Diagnosis  Name Primary?  . Pain in right hip Yes    I recommended an IM injection however she declined so I put her on a oral anti-inflammatory and started and recommended heat  Meds ordered this encounter  Medications  . meloxicam (MOBIC) 7.5 MG tablet    Sig: Take 1 tablet (7.5 mg total) by mouth daily.    Dispense:  30 tablet    Refill:  5

## 2019-06-02 ENCOUNTER — Other Ambulatory Visit: Payer: Self-pay

## 2019-06-02 ENCOUNTER — Ambulatory Visit (INDEPENDENT_AMBULATORY_CARE_PROVIDER_SITE_OTHER): Payer: Medicare HMO | Admitting: Family Medicine

## 2019-06-02 ENCOUNTER — Encounter: Payer: Self-pay | Admitting: Family Medicine

## 2019-06-02 VITALS — BP 112/70 | HR 82 | Temp 98.0°F | Wt 299.8 lb

## 2019-06-02 DIAGNOSIS — R519 Headache, unspecified: Secondary | ICD-10-CM | POA: Diagnosis not present

## 2019-06-02 DIAGNOSIS — G8929 Other chronic pain: Secondary | ICD-10-CM | POA: Diagnosis not present

## 2019-06-02 MED ORDER — TRAMADOL HCL 50 MG PO TABS: 50.0000 mg | ORAL_TABLET | Freq: Four times a day (QID) | ORAL | 0 refills | Status: AC | PRN
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
## 2019-06-15 ENCOUNTER — Other Ambulatory Visit: Payer: Self-pay | Admitting: *Deleted

## 2019-06-15 ENCOUNTER — Telehealth: Payer: Self-pay | Admitting: Family Medicine

## 2019-06-15 DIAGNOSIS — R519 Headache, unspecified: Secondary | ICD-10-CM

## 2019-06-15 NOTE — Telephone Encounter (Signed)
Pt called and was follow up on her order for MR Brain wo contrast it shows where order was placed on 06/02/19 with Dr Lovena Le

## 2019-06-16 NOTE — Telephone Encounter (Signed)
Spoke to insurance who states scan was approved 5/28. Patient aware she is scheduled for MRI June 23rd @ 8:30 at [facility].

## 2019-06-16 NOTE — Telephone Encounter (Signed)
Spoke to patient and let her know this was being worked on-- she states she can go any day to [employer]

## 2019-06-24 ENCOUNTER — Encounter: Payer: Self-pay | Admitting: Family Medicine

## 2019-06-24 ENCOUNTER — Other Ambulatory Visit: Payer: Self-pay

## 2019-06-24 ENCOUNTER — Encounter: Payer: Self-pay | Admitting: Neurology

## 2019-06-24 ENCOUNTER — Ambulatory Visit (INDEPENDENT_AMBULATORY_CARE_PROVIDER_SITE_OTHER): Payer: Medicare HMO | Admitting: Family Medicine

## 2019-06-24 VITALS — BP 132/70 | HR 89 | Temp 97.9°F | Ht 67.0 in | Wt 300.0 lb

## 2019-06-24 DIAGNOSIS — R519 Headache, unspecified: Secondary | ICD-10-CM

## 2019-06-24 DIAGNOSIS — G8929 Other chronic pain: Secondary | ICD-10-CM

## 2019-06-24 DIAGNOSIS — M5481 Occipital neuralgia: Secondary | ICD-10-CM

## 2019-06-24 MED ORDER — GABAPENTIN 300 MG PO CAPS: ORAL_CAPSULE | ORAL | 3 refills | Status: DC

## 2019-06-24 NOTE — Patient Instructions (Addendum)
Change to gabapentin-  Take 600mg  AM gabapentin, then 1 tab (300mg ) at lunch, and 600mg  at night.  take the aleve 2x per day if needed.  Get MRI of brain and follow up with neurologist.   Stop the meloxicam.  Call us if not improving or more sleepy/groggy in morning.
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
## 2019-07-06 ENCOUNTER — Ambulatory Visit (HOSPITAL_COMMUNITY)
Admission: RE | Admit: 2019-07-06 | Discharge: 2019-07-06 | Disposition: A | Discharge status: Home / Self Care | Payer: Medicare HMO | Source: Ambulatory Visit | Attending: Family Medicine | Admitting: Family Medicine

## 2019-07-06 ENCOUNTER — Other Ambulatory Visit: Payer: Self-pay

## 2019-07-06 DIAGNOSIS — R519 Headache, unspecified: Secondary | ICD-10-CM | POA: Diagnosis not present

## 2019-07-06 DIAGNOSIS — G8929 Other chronic pain: Secondary | ICD-10-CM | POA: Insufficient documentation

## 2019-07-20 ENCOUNTER — Other Ambulatory Visit (HOSPITAL_COMMUNITY): Payer: Self-pay | Admitting: Family Medicine

## 2019-07-20 DIAGNOSIS — Z1231 Encounter for screening mammogram for malignant neoplasm of breast: Secondary | ICD-10-CM
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
## 2019-07-28 ENCOUNTER — Encounter: Payer: Self-pay | Admitting: Neurology

## 2019-07-28 ENCOUNTER — Other Ambulatory Visit: Payer: Self-pay

## 2019-07-28 ENCOUNTER — Ambulatory Visit: Payer: Medicare HMO | Admitting: Neurology

## 2019-07-28 VITALS — BP 124/69 | HR 72 | Resp 18 | Ht 67.0 in | Wt 295.0 lb

## 2019-07-28 DIAGNOSIS — M792 Neuralgia and neuritis, unspecified: Secondary | ICD-10-CM | POA: Diagnosis not present

## 2019-07-28 MED ORDER — GABAPENTIN 300 MG PO CAPS
600.0000 mg | ORAL_CAPSULE | Freq: Three times a day (TID) | ORAL | 5 refills | Status: DC
Start: 2019-07-28 — End: 2020-05-23

## 2019-07-28 NOTE — Patient Instructions (Signed)
1.  Increase gabapentin 300mg  capsule to 2 capsules three times daily 2.  If pain not improved in 6 weeks, contact me and we can either increase dose or change to a new medication 3.  Follow up in 6 months

## 2019-08-01 ENCOUNTER — Other Ambulatory Visit: Payer: Self-pay | Admitting: Family Medicine

## 2019-08-02 ENCOUNTER — Telehealth: Payer: Self-pay | Admitting: Family Medicine

## 2019-08-02 MED ORDER — FUROSEMIDE 20 MG PO TABS: ORAL_TABLET | ORAL | 0 refills | Status: DC

## 2019-08-02 MED ORDER — POTASSIUM CHLORIDE ER 10 MEQ PO TBCR: EXTENDED_RELEASE_TABLET | ORAL | 0 refills | Status: DC

## 2019-08-02 NOTE — Telephone Encounter (Signed)
Discussed with pt. Pt verbalized understanding. Meds sent to pharm.

## 2019-08-02 NOTE — Telephone Encounter (Signed)
Pt has been out med for a couple of days. No sob.

## 2019-08-02 NOTE — Telephone Encounter (Signed)
Patient is requesting refill on furosemide 20 mg completely out and her feet are swelling. She is out of town in [place] and uses [place] phone number is [phone]. Last seen 06/24/19- Please advise

## 2019-08-02 NOTE — Telephone Encounter (Signed)
Last med check up 10/22/18. No upcoming appt scheduled.

## 2019-08-02 NOTE — Telephone Encounter (Signed)
Duplicate

## 2019-08-02 NOTE — Telephone Encounter (Signed)
May have a refill of furosemide Also important to take potassium with it as directed-this is on her med list Patient should do a follow-up office visit with [place] this fall  If she develops shortness of breath or difficulty breathing related to activities she should be checked out right away

## 2019-08-03 ENCOUNTER — Other Ambulatory Visit: Payer: Self-pay

## 2019-08-03 ENCOUNTER — Other Ambulatory Visit: Payer: Self-pay | Admitting: Family Medicine

## 2019-08-03 MED ORDER — FUROSEMIDE 20 MG PO TABS: ORAL_TABLET | ORAL | 0 refills | Status: DC

## 2019-08-11 ENCOUNTER — Ambulatory Visit (HOSPITAL_COMMUNITY)
Admission: RE | Admit: 2019-08-11 | Discharge: 2019-08-11 | Disposition: A | Discharge status: Home / Self Care | Payer: Medicare HMO | Source: Ambulatory Visit | Attending: Family Medicine | Admitting: Family Medicine

## 2019-08-11 ENCOUNTER — Other Ambulatory Visit: Payer: Self-pay

## 2019-08-11 DIAGNOSIS — Z1231 Encounter for screening mammogram for malignant neoplasm of breast: Secondary | ICD-10-CM | POA: Diagnosis not present

## 2019-08-21 ENCOUNTER — Other Ambulatory Visit: Payer: Self-pay | Admitting: Family Medicine

## 2019-09-19 ENCOUNTER — Other Ambulatory Visit: Payer: Self-pay | Admitting: Family Medicine

## 2019-09-20 NOTE — Telephone Encounter (Signed)
Scheduled 10/11

## 2019-10-24 ENCOUNTER — Encounter: Payer: Self-pay | Admitting: Family Medicine

## 2019-10-24 ENCOUNTER — Ambulatory Visit (INDEPENDENT_AMBULATORY_CARE_PROVIDER_SITE_OTHER): Payer: Medicare HMO | Admitting: Family Medicine

## 2019-10-24 ENCOUNTER — Other Ambulatory Visit: Payer: Self-pay

## 2019-10-24 VITALS — BP 110/68 | HR 102 | Temp 97.6°F | Wt 285.5 lb

## 2019-10-24 DIAGNOSIS — E1169 Type 2 diabetes mellitus with other specified complication: Secondary | ICD-10-CM | POA: Diagnosis not present

## 2019-10-24 DIAGNOSIS — E785 Hyperlipidemia, unspecified: Secondary | ICD-10-CM

## 2019-10-24 DIAGNOSIS — E119 Type 2 diabetes mellitus without complications: Secondary | ICD-10-CM | POA: Diagnosis not present

## 2019-10-24 DIAGNOSIS — Z23 Encounter for immunization: Secondary | ICD-10-CM | POA: Diagnosis not present

## 2019-10-24 DIAGNOSIS — I10 Essential (primary) hypertension: Secondary | ICD-10-CM

## 2019-10-24 DIAGNOSIS — Z1211 Encounter for screening for malignant neoplasm of colon: Secondary | ICD-10-CM

## 2019-10-24 MED ORDER — ROSUVASTATIN CALCIUM 40 MG PO TABS: ORAL_TABLET | ORAL | 1 refills | Status: DC

## 2019-10-24 MED ORDER — POTASSIUM CHLORIDE ER 10 MEQ PO TBCR: EXTENDED_RELEASE_TABLET | ORAL | 1 refills | Status: DC

## 2019-10-24 MED ORDER — FUROSEMIDE 20 MG PO TABS: ORAL_TABLET | ORAL | 1 refills | Status: DC

## 2019-10-24 MED ORDER — LEVEMIR FLEXTOUCH 100 UNIT/ML ~~LOC~~ SOPN: PEN_INJECTOR | SUBCUTANEOUS | 5 refills | Status: DC

## 2019-10-24 MED ORDER — METFORMIN HCL 500 MG PO TABS: 1000.0000 mg | ORAL_TABLET | Freq: Two times a day (BID) | ORAL | 1 refills | Status: DC

## 2019-10-24 NOTE — Progress Notes (Signed)
   Subjective:    Patient ID: [name], female    DOB: [birth year], 69 y.o.   MRN: [id number]  HPI Patient comes in for follow up on headaches. Patient has seen neurology and started gabapentin. Patient reports improvement in headaches and is only experiencing an "itching" feeling on the top of her head every once in awhile.   Type 2 diabetes mellitus with hemoglobin A1c goal of less than 7.5% (HCC)  Hyperlipidemia associated with type 2 diabetes mellitus ([place])  Hypertension, unspecified type  Morbid obesity ([place]) Patient does try to minimize salt in her diet. She is unable to do a whole lot of physical activity or exercise but she does try to stay active She states she has been trying to be careful and avoid Covid She has been vaccinated   Review of Systems  Constitutional: Negative for activity change, appetite change and fatigue.  HENT: Negative for congestion and rhinorrhea.   Respiratory: Negative for cough and shortness of breath.   Cardiovascular: Negative for chest pain and leg swelling.  Gastrointestinal: Negative for abdominal pain and diarrhea.  Endocrine: Negative for polydipsia and polyphagia.  Skin: Negative for color change.  Neurological: Negative for dizziness and weakness.  Psychiatric/Behavioral: Negative for behavioral problems and confusion.       Objective:   Physical Exam Vitals reviewed.  Constitutional:      General: She is not in acute distress. HENT:     Head: Normocephalic and atraumatic.  Eyes:     General:        Right eye: No discharge.        Left eye: No discharge.  Neck:     Trachea: No tracheal deviation.  Cardiovascular:     Rate and Rhythm: Normal rate and regular rhythm.     Heart sounds: Normal heart sounds. No murmur heard.   Pulmonary:     Effort: Pulmonary effort is normal. No respiratory distress.     Breath sounds: Normal breath sounds.  Lymphadenopathy:     Cervical: No cervical adenopathy.  Skin:    General:  Skin is warm and dry.  Neurological:     Mental Status: She is alert.     Coordination: Coordination normal.  Psychiatric:        Behavior: Behavior normal.     Diabetic foot exam normal      Assessment & Plan:  1. Type 2 diabetes mellitus with hemoglobin A1c goal of less than 7.5% (HCC) She will do her lab work.  We did discuss healthy eating regular physical activity and continuing medications  2. Hyperlipidemia associated with type 2 diabetes mellitus ([place]) She does take her cholesterol medicine watch his diet stays active as much as her body allows  3. Hypertension, unspecified type Blood pressure good control watch his diet minimize salt  4. Morbid obesity ([place]) Try to watch her portions trying to lose some weight This is associated with her blood pressure as well as cholesterol and diabetes Flu and pneumonia shot today

## 2019-10-25 LAB — COMPREHENSIVE METABOLIC PANEL
ALT: 21 IU/L (ref 0–32)
AST: 17 IU/L (ref 0–40)
Albumin/Globulin Ratio: 1.1 — ABNORMAL LOW (ref 1.2–2.2)
Albumin: 4.1 g/dL (ref 3.8–4.8)
Alkaline Phosphatase: 182 IU/L — ABNORMAL HIGH (ref 44–121)
BUN/Creatinine Ratio: 12 (ref 12–28)
BUN: 12 mg/dL (ref 8–27)
Bilirubin Total: 0.3 mg/dL (ref 0.0–1.2)
CO2: 24 mmol/L (ref 20–29)
Calcium: 10 mg/dL (ref 8.7–10.3)
Chloride: 98 mmol/L (ref 96–106)
Creatinine, Ser: 0.97 mg/dL (ref 0.57–1.00)
GFR calc Af Amer: 69 mL/min/{1.73_m2} (ref >59)
GFR calc non Af Amer: 60 mL/min/{1.73_m2} (ref >59)
Globulin, Total: 3.8 g/dL (ref 1.5–4.5)
Glucose: 384 mg/dL — ABNORMAL HIGH (ref 65–99)
Potassium: 4.4 mmol/L (ref 3.5–5.2)
Sodium: 133 mmol/L — ABNORMAL LOW (ref 134–144)
Total Protein: 7.9 g/dL (ref 6.0–8.5)

## 2019-10-25 LAB — HEMOGLOBIN A1C
Est. average glucose Bld gHb Est-mCnc: 398 mg/dL
Hgb A1c MFr Bld: 15.5 % — ABNORMAL HIGH (ref 4.8–5.6)

## 2019-10-25 LAB — MICROALBUMIN / CREATININE URINE RATIO
Creatinine, Urine: 47.6 mg/dL
Microalb/Creat Ratio: <6 mg/g creat (ref 0–29)
Microalbumin, Urine: <3 ug/mL

## 2019-10-25 LAB — LIPID PANEL
Chol/HDL Ratio: 7.3 ratio — ABNORMAL HIGH (ref 0.0–4.4)
Cholesterol, Total: 357 mg/dL — ABNORMAL HIGH (ref 100–199)
HDL: 49 mg/dL (ref >39)
LDL Chol Calc (NIH): 264 mg/dL — ABNORMAL HIGH (ref 0–99)
Triglycerides: 212 mg/dL — ABNORMAL HIGH (ref 0–149)
VLDL Cholesterol Cal: 44 mg/dL — ABNORMAL HIGH (ref 5–40)

## 2019-11-01 ENCOUNTER — Encounter: Payer: Self-pay | Admitting: Internal Medicine

## 2019-12-13 ENCOUNTER — Ambulatory Visit (INDEPENDENT_AMBULATORY_CARE_PROVIDER_SITE_OTHER): Payer: Medicare HMO | Admitting: Gastroenterology

## 2019-12-13 ENCOUNTER — Other Ambulatory Visit: Payer: Self-pay

## 2019-12-13 ENCOUNTER — Encounter: Payer: Self-pay | Admitting: Gastroenterology

## 2019-12-13 ENCOUNTER — Telehealth: Payer: Self-pay | Admitting: *Deleted

## 2019-12-13 DIAGNOSIS — Z1211 Encounter for screening for malignant neoplasm of colon: Secondary | ICD-10-CM | POA: Diagnosis not present

## 2019-12-13 NOTE — Progress Notes (Signed)
Primary Care Physician:  Kathyrn Drown, MD  Primary Gastroenterologist:  Elon Alas. Abbey Chatters, DO   Chief Complaint  Patient presents with  . Colonoscopy    last tcs at least 10 yrs ago    HPI:  [name] is a 69 y.o. female here to schedule 10-year screening colonoscopy.  Last colonoscopy December 2010, slightly tortuous colon, internal hemorrhoids.  No family history of colon cancer.  From a GI standpoint she is doing well.  Bowel movements are regular.  No blood in the stool or melena.  No upper GI symptoms.  She struggles with her diabetes.  A1c 1 month ago was 15.5.  She was seen by Dr. Dorris Fetch earlier this year but does not plan to go back because she did not like him.  Currently her diabetes is being managed by her PCP.  Current Outpatient Medications  Medication Sig Dispense Refill  . aspirin 81 MG tablet Take 81 mg by mouth daily.    . blood glucose meter kit and supplies Dispense based on patient and insurance preference. Use to check glucose up to three times a day. ( For E11.9). 1 each 5  . Cinnamon 500 MG capsule Take 500 mg by mouth daily.    . Flaxseed, Linseed, (FLAX SEED OIL PO) Take 3 capsules by mouth daily.     . furosemide (LASIX) 20 MG tablet TAKE 1 TO 2 TABLETS BY MOUTH ONCE DAILY IN THE MORNING AS NEEDED FOR  PEDAL  EDEMA 90 tablet 1  . gabapentin (NEURONTIN) 300 MG capsule Take 2 capsules (600 mg total) by mouth 3 (three) times daily. 180 capsule 5  . insulin detemir (LEVEMIR FLEXTOUCH) 100 UNIT/ML FlexPen INJECT 80 UNITS SUBCUTANEOUSLY AT BEDTIME 30 mL 5  . Lancets (ONETOUCH DELICA PLUS [id number]) MISC USE 1  TO CHECK GLUCOSE UP TO THREE TIMES DAILY 100 each 5  . meloxicam (MOBIC) 7.5 MG tablet Take 1 tablet (7.5 mg total) by mouth daily. 30 tablet 5  . metFORMIN (GLUCOPHAGE) 500 MG tablet Take 2 tablets (1,000 mg total) by mouth 2 (two) times daily with a meal. 360 tablet 1  . Multiple Minerals-Vitamins (BONE ESSENTIALS PO) Take 1 tablet by mouth daily.     . potassium chloride (KLOR-CON) 10 MEQ tablet 1 or 2 each am as directed 180 tablet 1  . RELION PEN NEEDLES 32G X 4 MM MISC USE AS DIRECTED 50 each 11  . rosuvastatin (CRESTOR) 40 MG tablet TAKE 1 TABLET BY MOUTH ONCE DAILY 90 tablet 1   No current facility-administered medications for this visit.    Allergies as of 12/13/2019 - Review Complete 12/13/2019  Allergen Reaction Noted  . Penicillins Itching 10/25/2016  . Tramadol Itching 07/12/2018  . Hydrocodone Itching 11/23/2012    Past Medical History:  Diagnosis Date  . Arthritis   . Essential hypertension   . Finger amputation, traumatic Age 28   [place]  . Hyperlipidemia   . Obesity   . PONV (postoperative nausea and vomiting)   . Type 2 diabetes mellitus ([place])   . Type 2 diabetes mellitus with diabetic neuropathy, unspecified ([place]) 02/04/2018    Past Surgical History:  Procedure Laterality Date  . ABDOMINAL HYSTERECTOMY    . COLONOSCOPY  12/2008   Dr. Oneida Alar: slightly tortuous colon. internal hemorrhoids. next colonoscopy in 10 years.   Marland Kitchen HERNIA REPAIR     x2  . OOPHORECTOMY    . REPLACEMENT TOTAL KNEE Right   . TOTAL HIP ARTHROPLASTY Left 2008?  [facility], [place]  . TOTAL KNEE ARTHROPLASTY  09/09/2010   Procedure: TOTAL KNEE ARTHROPLASTY;  Surgeon: Arther Abbott, MD;  Location: [facility];  Service: Orthopedics;  Laterality: Left;  With [place]    Family History  Problem Relation Age of Onset  . Arthritis Other   . Heart attack Sister   . Breast cancer Sister   . Colon cancer Neg Hx     Social History   Socioeconomic History  . Marital status: Single    Spouse name: Not on file  . Number of children: 4  . Years of education: 9  . Highest education level: Not on file  Occupational History  . Occupation: retired  Tobacco Use  . Smoking status: Never Smoker  . Smokeless tobacco: Never Used  Vaping Use  . Vaping Use: Never used  Substance and Sexual Activity  . Alcohol use: No  . Drug use: Never  . Sexual  activity: Not on file  Other Topics Concern  . Not on file  Social History Narrative   Right handed   Drinks caffeine coffee    An apartment down stairs   Social Determinants of Health   Financial Resource Strain:   . Difficulty of Paying Living Expenses: Not on file  Food Insecurity:   . Worried About Charity fundraiser in the Last Year: Not on file  . Ran Out of Food in the Last Year: Not on file  Transportation Needs:   . Lack of Transportation (Medical): Not on file  . Lack of Transportation (Non-Medical): Not on file  Physical Activity:   . Days of Exercise per Week: Not on file  . Minutes of Exercise per Session: Not on file  Stress:   . Feeling of Stress : Not on file  Social Connections:   . Frequency of Communication with Friends and Family: Not on file  . Frequency of Social Gatherings with Friends and Family: Not on file  . Attends Religious Services: Not on file  . Active Member of Clubs or Organizations: Not on file  . Attends Archivist Meetings: Not on file  . Marital Status: Not on file  Intimate Partner Violence:   . Fear of Current or Ex-Partner: Not on file  . Emotionally Abused: Not on file  . Physically Abused: Not on file  . Sexually Abused: Not on file      ROS:  General: Negative for anorexia, weight loss, fever, chills, fatigue, weakness. Eyes: Negative for vision changes.  ENT: Negative for hoarseness, difficulty swallowing , nasal congestion. CV: Negative for chest pain, angina, palpitations, dyspnea on exertion, peripheral edema.  Respiratory: Negative for dyspnea at rest, dyspnea on exertion, cough, sputum, wheezing.  GI: See history of present illness. GU:  Negative for dysuria, hematuria, urinary incontinence, urinary frequency, nocturnal urination.  MS: Negative for joint pain, low back pain.  Derm: Negative for rash or itching.  Neuro: Negative for weakness, abnormal sensation, seizure,  memory loss, confusion.  Positive  headache  psych: Negative for anxiety, depression, suicidal ideation, hallucinations.  Endo: Negative for unusual weight change.  Heme: Negative for bruising or bleeding. Allergy: Negative for rash or hives.    Physical Examination:  BP 132/79   Pulse 82   Temp (!) 96.8 F (36 C) (Temporal)   Ht 5' 7"  (1.702 m)   Wt 286 lb 9.6 oz (130 kg)   BMI 44.89 kg/m    General: Well-nourished, well-developed in no acute distress.  Head: Normocephalic, atraumatic.  Eyes: Conjunctiva pink, no icterus. Mouth: masked Neck: Supple without thyromegaly, masses, or lymphadenopathy.  Lungs: Clear to auscultation bilaterally.  Heart: Regular rate and rhythm, no murmurs rubs or gallops.  Abdomen: Bowel sounds are normal, nontender, nondistended, no hepatosplenomegaly or masses, no abdominal bruits or    hernia , no rebound or guarding.   Rectal: not performed Extremities: No lower extremity edema. No clubbing or deformities.  Neuro: Alert and oriented x 4 , grossly normal neurologically.  Skin: Warm and dry, no rash or jaundice.   Psych: Alert and cooperative, normal mood and affect.  Labs: Lab Results  Component Value Date   CREATININE 0.97 10/24/2019   BUN 12 10/24/2019   NA 133 (L) 10/24/2019   K 4.4 10/24/2019   CL 98 10/24/2019   CO2 24 10/24/2019    Lab Results  Component Value Date   WBC 7.7 04/09/2015   HGB 12.7 04/09/2015   HCT 38.8 04/09/2015   MCV 85 04/09/2015   PLT 257 04/09/2015   Lab Results  Component Value Date   HGBA1C 15.5 (H) 10/24/2019     Imaging Studies: No results found.  Impression/plan:  69 year old female presenting to schedule 10-year screening colonoscopy.  She denies any symptoms.  No family history of colon cancer.  We will plan on colonoscopy in the near future with Dr. Abbey Chatters. ASA III.  I have discussed the risks, alternatives, benefits with regards to but not limited to the risk of reaction to medication, bleeding, infection, perforation and  the patient is agreeable to proceed. Written consent to be obtained.  Discussed with her the need for her to follow-up with PCP as per their instructions with documented daily blood glucose levels so appropriate adjustments could be made. She is aware that if her sugar is too high day of procedure, her colonoscopy could be cancelled.

## 2019-12-13 NOTE — Telephone Encounter (Signed)
Called pt, no answer and VM not set up yet. She needs TCS with Dr. Abbey Chatters, ASA 3

## 2019-12-13 NOTE — Patient Instructions (Signed)
Colonoscopy as scheduled. See separate instructions.  

## 2019-12-14 ENCOUNTER — Encounter: Payer: Self-pay | Admitting: Gastroenterology

## 2019-12-14 NOTE — Progress Notes (Signed)
Cc'ed to pcp °

## 2019-12-15 NOTE — Telephone Encounter (Signed)
Called pt, no answer and unable to leave VM. Letter mailed

## 2020-01-09 ENCOUNTER — Ambulatory Visit: Payer: Medicare HMO | Admitting: Orthopedic Surgery

## 2020-01-30 ENCOUNTER — Ambulatory Visit: Payer: Medicare HMO | Admitting: Neurology

## 2020-02-06 ENCOUNTER — Ambulatory Visit: Payer: Medicare HMO

## 2020-02-06 ENCOUNTER — Ambulatory Visit: Payer: Medicare HMO | Admitting: Orthopedic Surgery

## 2020-02-06 ENCOUNTER — Other Ambulatory Visit: Payer: Self-pay

## 2020-02-06 ENCOUNTER — Encounter: Payer: Self-pay | Admitting: Orthopedic Surgery

## 2020-02-06 VITALS — BP 166/79 | HR 99 | Ht 67.0 in | Wt 286.0 lb

## 2020-02-06 DIAGNOSIS — M1712 Unilateral primary osteoarthritis, left knee: Secondary | ICD-10-CM | POA: Diagnosis not present

## 2020-02-06 DIAGNOSIS — Z96652 Presence of left artificial knee joint: Secondary | ICD-10-CM

## 2020-02-06 DIAGNOSIS — Z96651 Presence of right artificial knee joint: Secondary | ICD-10-CM

## 2020-02-06 DIAGNOSIS — M1711 Unilateral primary osteoarthritis, right knee: Secondary | ICD-10-CM | POA: Diagnosis not present

## 2020-02-06 NOTE — Progress Notes (Signed)
Chief Complaint  Patient presents with  . Routine Post Op    Lt knee DOS 09/02/10, Rt knee 06/12/15 (In [place], [place])    [name] is 70 years old she is 10 years status post left total knee with a Sigma fixed-bearing posterior stabilized replacement and 5 years status post right total knee which was done in [place] with a similar implant  She does not have any complaints related to her knees  Review of systems she complains of occasional hip pain but this is controlled with Aleve  BP (!) 166/79   Pulse 99   Ht 5\' 7"  (1.702 m)   Wt 286 lb (129.7 kg)   BMI 44.79 kg/m   Physical Exam Constitutional:      Appearance: Normal appearance. She is obese. She is not ill-appearing.  Neurological:     General: No focal deficit present.     Mental Status: She is alert.     Sensory: No sensory deficit.     Motor: No weakness.     Coordination: Coordination normal.     Gait: Gait normal.  Psychiatric:        Mood and Affect: Mood normal.        Behavior: Behavior normal.        Thought Content: Thought content normal.        Judgment: Judgment normal.    Images were obtained standing both knees with no evidence of implant abnormality.  Lateral x-rays of the knee show no problems as well  Patient is comfortable now with symptomatic follow-up  She will follow up as needed  Encounter Diagnoses  Name Primary?  . Status post total left knee replacement Yes  . S/P total knee replacement, right

## 2020-04-23 ENCOUNTER — Ambulatory Visit: Payer: Medicare HMO | Admitting: Family Medicine

## 2020-05-03 ENCOUNTER — Encounter: Payer: Self-pay | Admitting: Family Medicine

## 2020-05-03 ENCOUNTER — Ambulatory Visit (HOSPITAL_COMMUNITY)
Admission: RE | Admit: 2020-05-03 | Discharge: 2020-05-03 | Disposition: A | Discharge status: Home / Self Care | Payer: Medicare HMO | Source: Ambulatory Visit | Attending: Family Medicine | Admitting: Family Medicine

## 2020-05-03 ENCOUNTER — Ambulatory Visit (INDEPENDENT_AMBULATORY_CARE_PROVIDER_SITE_OTHER): Payer: Medicare HMO | Admitting: Family Medicine

## 2020-05-03 ENCOUNTER — Other Ambulatory Visit: Payer: Self-pay

## 2020-05-03 VITALS — BP 132/82 | HR 89 | Temp 98.0°F | Ht 67.0 in | Wt 282.4 lb

## 2020-05-03 DIAGNOSIS — M25512 Pain in left shoulder: Secondary | ICD-10-CM | POA: Insufficient documentation

## 2020-05-03 DIAGNOSIS — M79642 Pain in left hand: Secondary | ICD-10-CM | POA: Diagnosis not present

## 2020-05-03 DIAGNOSIS — M19012 Primary osteoarthritis, left shoulder: Secondary | ICD-10-CM | POA: Diagnosis not present

## 2020-05-03 DIAGNOSIS — M79602 Pain in left arm: Secondary | ICD-10-CM

## 2020-05-03 DIAGNOSIS — M19042 Primary osteoarthritis, left hand: Secondary | ICD-10-CM | POA: Diagnosis not present

## 2020-05-03 MED ORDER — NAPROXEN 500 MG PO TABS: 500.0000 mg | ORAL_TABLET | Freq: Two times a day (BID) | ORAL | 0 refills | Status: DC

## 2020-05-03 NOTE — Patient Instructions (Addendum)
Shoulder Pain Many things can cause shoulder pain, including:  An injury.  Moving the shoulder in the same way again and again (overuse).  Joint pain (arthritis). Pain can come from:  Swelling and irritation (inflammation) of any part of the shoulder.  An injury to the shoulder joint.  An injury to: ? Tissues that connect muscle to bone (tendons). ? Tissues that connect bones to each other (ligaments). ? Bones. Follow these instructions at home: Watch for changes in your symptoms. Let your doctor know about them. Follow these instructions to help with your pain. If you have a sling:  Wear the sling as told by your doctor. Remove it only as told by your doctor.  Loosen the sling if your fingers: ? Tingle. ? Become numb. ? Turn cold and blue.  Keep the sling clean.  If the sling is not waterproof: ? Do not let it get wet. ? Take the sling off when you shower or bathe. Managing pain, stiffness, and swelling  If told, put ice on the painful area: ? Put ice in a plastic bag. ? Place a towel between your skin and the bag. ? Leave the ice on for 20 minutes, 2-3 times a day. Stop putting ice on if it does not help with the pain.  Squeeze a soft ball or a foam pad as much as possible. This prevents swelling in the shoulder. It also helps to strengthen the arm.   General instructions  Take over-the-counter and prescription medicines only as told by your doctor.  Keep all follow-up visits as told by your doctor. This is important. Contact a doctor if:  Your pain gets worse.  Medicine does not help your pain.  You have new pain in your arm, hand, or fingers. Get help right away if:  Your arm, hand, or fingers: ? Tingle. ? Are numb. ? Are swollen. ? Are painful. ? Turn white or blue. Summary  Shoulder pain can be caused by many things. These include injury, moving the shoulder in the same away again and again, and joint pain.  Watch for changes in your  symptoms. Let your doctor know about them.  This condition may be treated with a sling, ice, and pain medicine.  Contact your doctor if the pain gets worse or you have new pain. Get help right away if your arm, hand, or fingers tingle or get numb, swollen, or painful.  Keep all follow-up visits as told by your doctor. This is important. This information is not intended to replace advice given to you by your health care provider. Make sure you discuss any questions you have with your health care provider. Document Revised: 07/14/2017 Document Reviewed: 07/14/2017 Elsevier Patient Education  2021 [place] for Routine Care of Injuries Many injuries can be cared for with rest, ice, compression, and elevation (RICE therapy). This includes: Resting the injured body part. Putting ice on the injury. Putting pressure (compression) on the injury. Raising the injured part (elevation). Using RICE therapy can help to lessen pain and swelling. Supplies needed: Ice. Plastic bag. Towel. Elastic bandage. Pillow or pillows to raise your injured body part. How to care for your injury with RICE therapy Rest Try to rest the injured part of your body. You can go back to your normal activities when your doctor says it is okay to do them and when you can do them without pain. If you rest the injury too much, it may not heal as well.  Some injuries heal better with early movement instead of resting for too long. Ask your doctor if you should do exercises to help your injury get better. Ice If told, put ice on the injured area. To do this: Put ice in a plastic bag. Place a towel between your skin and the bag. Leave the ice on for 20 minutes, 2-3 times a day. Take off the ice if your skin turns bright red. This is very important. If you cannot feel pain, heat, or cold, you have a greater risk of damage to the area. Do not put ice on your bare skin. Use ice for as many days as your doctor  tells you to use it.   Compression Put pressure on the injured area. This can be done with an elastic bandage. If this type of bandage has been put on your injury: Follow instructions on the package the bandage came in about how to use it. Do not wrap the bandage too tightly. Wrap the bandage more loosely if part of your body beyond the bandage is blue, swollen, cold, painful, or loses feeling. Take off the bandage and put it on again every 3-4 hours or as told by your doctor. See your doctor if the bandage seems to make your problems worse.   Elevation Raise the injured area above the level of your heart while you are sitting or lying down. Follow these instructions at home: If your symptoms get worse or last a long time, make a follow-up appointment with your doctor. You may need to have imaging tests, such as X-rays or an MRI. If you have imaging tests, ask how to get your results when they are ready. Return to your normal activities when your doctor says that it is safe. Keep all follow-up visits. Contact a doctor if: You keep having pain and swelling. Your symptoms get worse. Get help right away if: You have sudden, very bad pain at your injury or lower than your injury. You have redness or more swelling around your injury. You have tingling or numbness at your injury or lower than your injury, and it does not go away when you take off the bandage. Summary Many injuries can be cared for using rest, ice, compression, and elevation (RICE therapy). You can go back to your normal activities when your doctor says it is okay and when you can do them without pain. Put ice on the injured area as told by your doctor. Get help if your symptoms get worse or if you keep having pain and swelling. This information is not intended to replace advice given to you by your health care provider. Make sure you discuss any questions you have with your health care provider. Document Revised: 10/20/2019  Document Reviewed: 10/20/2019 Elsevier Patient Education  2021 [employer].

## 2020-05-03 NOTE — Progress Notes (Signed)
Patient ID: [name], female    DOB: 08-10-1950, 70 y.o.   MRN: [id number]   Chief Complaint  Patient presents with  . left shoulder pain    Patient needs letter for church stating she can not keep mask on at church because she cant breath with mask on   Subjective:  CC: left shoulder pain   This is a new problem.  Presents today for an acute visit with a complaint of left shoulder pain.  Reports that the pain starts in the left little finger, radiates up to the left shoulder.  Unable to lift shoulder overhead.  Reports that symptoms started on Tuesday night.  Reports that she has not done any new activities, denies any injury.  No fever no chills no swelling in the joint.    Medical History [name] has a past medical history of Arthritis, Essential hypertension, Finger amputation, traumatic (Age 40), Hyperlipidemia, Obesity, PONV (postoperative nausea and vomiting), Type 2 diabetes mellitus ([place]), and Type 2 diabetes mellitus with diabetic neuropathy, unspecified ([place]) (02/04/2018).   Outpatient Encounter Medications as of 05/03/2020  Medication Sig  . naproxen (NAPROSYN) 500 MG tablet Take 1 tablet (500 mg total) by mouth 2 (two) times daily with a meal.  . aspirin 81 MG tablet Take 81 mg by mouth daily.  . blood glucose meter kit and supplies Dispense based on patient and insurance preference. Use to check glucose up to three times a day. ( For E11.9).  Marland Kitchen Cinnamon 500 MG capsule Take 500 mg by mouth daily.  . Flaxseed, Linseed, (FLAX SEED OIL PO) Take 3 capsules by mouth daily.  . furosemide (LASIX) 20 MG tablet TAKE 1 TO 2 TABLETS BY MOUTH ONCE DAILY IN THE MORNING AS NEEDED FOR  PEDAL  EDEMA  . gabapentin (NEURONTIN) 300 MG capsule Take 2 capsules (600 mg total) by mouth 3 (three) times daily.  . insulin detemir (LEVEMIR FLEXTOUCH) 100 UNIT/ML FlexPen INJECT 80 UNITS SUBCUTANEOUSLY AT BEDTIME  . Lancets (ONETOUCH DELICA PLUS [id number]) MISC USE 1  TO CHECK GLUCOSE UP TO THREE  TIMES DAILY  . metFORMIN (GLUCOPHAGE) 500 MG tablet Take 2 tablets (1,000 mg total) by mouth 2 (two) times daily with a meal.  . Multiple Minerals-Vitamins (BONE ESSENTIALS PO) Take 1 tablet by mouth daily.  . potassium chloride (KLOR-CON) 10 MEQ tablet 1 or 2 each am as directed  . RELION PEN NEEDLES 32G X 4 MM MISC USE AS DIRECTED  . rosuvastatin (CRESTOR) 40 MG tablet TAKE 1 TABLET BY MOUTH ONCE DAILY  . [DISCONTINUED] meloxicam (MOBIC) 7.5 MG tablet Take 1 tablet (7.5 mg total) by mouth daily.   No facility-administered encounter medications on file as of 05/03/2020.     Review of Systems  Constitutional: Negative for chills and fever.  Respiratory: Negative for shortness of breath.   Cardiovascular: Negative for chest pain, palpitations and leg swelling.  Gastrointestinal: Negative for abdominal pain.  Musculoskeletal: Negative for joint swelling.       Pain starts in left little finger and radiates to left shoulder.   Neurological: Negative for dizziness, light-headedness and headaches.     Vitals BP 132/82   Pulse 89   Temp 98 F (36.7 C) (Oral)   Ht 5' 7"  (1.702 m)   Wt 282 lb 6.4 oz (128.1 kg)   SpO2 99%   BMI 44.23 kg/m   Objective:   Physical Exam Vitals reviewed.  Constitutional:      Appearance: Normal appearance.  Cardiovascular:  Rate and Rhythm: Normal rate and regular rhythm.     Heart sounds: Normal heart sounds.  Pulmonary:     Effort: Pulmonary effort is normal.     Breath sounds: Normal breath sounds.  Musculoskeletal:     Left shoulder: Tenderness present. No swelling, deformity, effusion or laceration. Decreased range of motion. Decreased strength. Normal pulse.     Comments: extreme tenderness to touch on left shoulder area.   Skin:    General: Skin is warm and dry.  Neurological:     General: No focal deficit present.     Mental Status: She is alert.  Psychiatric:        Behavior: Behavior normal.      Assessment and Plan   1.  Acute pain of left shoulder - DG Shoulder Left - naproxen (NAPROSYN) 500 MG tablet; Take 1 tablet (500 mg total) by mouth 2 (two) times daily with a meal.  Dispense: 28 tablet; Refill: 0 - Ambulatory referral to Orthopedic Surgery  2. Left arm pain - EKG 12-Lead  3. Left hand pain - DG Hand Complete Left      Extreme tenderness upon physical exam of the left shoulder, reports pain starts in little finger. .  Unable to lift arm overhead.  Denies injury.  Will get EKG to rule out cardiac involvement, x-ray of left hand with fingers and left shoulder to rule out fracture.   EKG: Normal sinus rhythm, no ectopy no ischemia evident today.  No ST elevation.  Reviewed with patient.  If x-rays negative for fracture, will treat conservatively with NSAIDS for 2 weeks and make referral to ortho for further evaluation/treatment. Denies history of GI bleed, HTN, and cardiac issues.   Update:  Rotator cuff tendinopathy, recommend RICE and Naproxen twice per day for 2 weeks.  Referral sent for orthopedics, Dr. Aline Brochure for further evaluation/treatment.   Agrees with plan of care discussed today. Understands warning signs to seek further care: chest pain, shortness of breath, any significant change in health.  Understands to follow-up in one  month, sooner if anything changes.   Pecolia Ades, NP 05/03/20

## 2020-05-23 ENCOUNTER — Encounter: Payer: Self-pay | Admitting: Orthopedic Surgery

## 2020-05-23 ENCOUNTER — Other Ambulatory Visit: Payer: Self-pay

## 2020-05-23 ENCOUNTER — Ambulatory Visit: Payer: Medicare HMO | Admitting: Family Medicine

## 2020-05-23 ENCOUNTER — Ambulatory Visit: Payer: Medicare HMO | Admitting: Orthopedic Surgery

## 2020-05-23 ENCOUNTER — Encounter: Payer: Self-pay | Admitting: Family Medicine

## 2020-05-23 VITALS — BP 135/67 | HR 79 | Ht 67.0 in | Wt 282.0 lb

## 2020-05-23 DIAGNOSIS — M7552 Bursitis of left shoulder: Secondary | ICD-10-CM

## 2020-05-23 DIAGNOSIS — Z6841 Body Mass Index (BMI) 40.0 and over, adult: Secondary | ICD-10-CM

## 2020-05-23 DIAGNOSIS — S76302A Unspecified injury of muscle, fascia and tendon of the posterior muscle group at thigh level, left thigh, initial encounter: Secondary | ICD-10-CM

## 2020-05-23 DIAGNOSIS — M5126 Other intervertebral disc displacement, lumbar region: Secondary | ICD-10-CM

## 2020-05-23 MED ORDER — GABAPENTIN 300 MG PO CAPS: 600.0000 mg | ORAL_CAPSULE | Freq: Four times a day (QID) | ORAL | 5 refills | Status: DC

## 2020-05-23 NOTE — Progress Notes (Signed)
NEW PROBLEM//OFFICE VISIT  Encounter Diagnoses  Name Primary?  . 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 Bursitis of left shoulder Yes  . Left hamstring injury, initial encounter   . Body mass index 40.0-44.9, adult ([place])   . Morbid obesity (HCC)    Increase gabapentin for the L5 root pain she is having left side Bursitis has resolved patient education made aware he may return, osteoarthritis shoulder with peripheral osteophytes joint space normal Left hamstring ibuprofen as needed The patient meets the AMA guidelines for Morbid (severe) obesity with a BMI > 40.0 and I have recommended weight loss.  Meds ordered this encounter  Medications  . gabapentin (NEURONTIN) 300 MG capsule    Sig: Take 2 capsules (600 mg total) by mouth 4 (four) times daily.    Dispense:  180 capsule    Refill:  5     Chief Complaint  Patient presents with  . Shoulder Pain    Left- no injury woke up one morning couldn't move it or raise it. It is fine now I have my movement back and no pain    Ms. Bring is 70 years old she came in for acute pain left shoulder which is subsequently resolved.  When she had it she could not move her arm denies any injury subsequently improved on its own  She has 2 other complaints she was in the yard with her dog turned and pulled her left hamstring still having some pain there  Also has a pain that runs from her left hip down to her left knee on the lateral side  Noted on x-ray that she has previous L5-S1 disc disease  She is on 600 mg gabapentin 3 times a day   Review of Systems  Constitutional: Negative for fever.  Neurological: Negative for tingling and sensory change.     Past Medical History:  Diagnosis Date  . Arthritis   . Essential hypertension   . Finger amputation, traumatic Age 7   [place]xe  . Hyperlipidemia   . Obesity   . PONV (postoperative nausea and vomiting)   . Type 2 diabetes mellitus ([place])   . Type 2 diabetes mellitus  with diabetic neuropathy, unspecified ([place]) 02/04/2018    Past Surgical History:  Procedure Laterality Date  . ABDOMINAL HYSTERECTOMY    . COLONOSCOPY  12/2008   Dr. Oneida Alar: slightly tortuous colon. internal hemorrhoids. next colonoscopy in 10 years.   Marland Kitchen HERNIA REPAIR     x2  . OOPHORECTOMY    . REPLACEMENT TOTAL KNEE Right   . TOTAL HIP ARTHROPLASTY Left 2008?   [facility], [facility]  . TOTAL KNEE ARTHROPLASTY  09/09/2010   Procedure: TOTAL KNEE ARTHROPLASTY;  Surgeon: Arther Abbott, MD;  Location: [facility];  Service: Orthopedics;  Laterality: Left;  With DePuy    Family History  Problem Relation Age of Onset  . Arthritis Other   . Heart attack Sister   . Breast cancer Sister   . Colon cancer Neg Hx    Social History   Tobacco Use  . Smoking status: Never Smoker  . Smokeless tobacco: Never Used  Vaping Use  . Vaping Use: Never used  Substance Use Topics  . Alcohol use: No  . Drug use: Never    Allergies  Allergen Reactions  . Penicillins Itching    Has patient had a PCN reaction causing immediate rash, facial/tongue/throat swelling, SOB or lightheadedness with hypotension: No Has patient had a PCN reaction causing severe  rash involving mucus membranes or skin necrosis: No Has patient had a PCN reaction that required hospitalization: No Has patient had a PCN reaction occurring within the last 10 years: No If all of the above answers are "NO", then may proceed with Cephalosporin use.   . Tramadol Itching  . Hydrocodone Itching    Current Meds  Medication Sig  . aspirin 81 MG tablet Take 81 mg by mouth daily.  . blood glucose meter kit and supplies Dispense based on patient and insurance preference. Use to check glucose up to three times a day. ( For E11.9).  Marland Kitchen Cinnamon 500 MG capsule Take 500 mg by mouth daily.  . Flaxseed, Linseed, (FLAX SEED OIL PO) Take 3 capsules by mouth daily.  . furosemide (LASIX) 20 MG tablet TAKE 1 TO 2 TABLETS BY MOUTH ONCE DAILY IN THE MORNING  AS NEEDED FOR  PEDAL  EDEMA  . insulin detemir (LEVEMIR FLEXTOUCH) 100 UNIT/ML FlexPen INJECT 80 UNITS SUBCUTANEOUSLY AT BEDTIME  . Lancets (ONETOUCH DELICA PLUS [id number]) MISC USE 1  TO CHECK GLUCOSE UP TO THREE TIMES DAILY  . metFORMIN (GLUCOPHAGE) 500 MG tablet Take 2 tablets (1,000 mg total) by mouth 2 (two) times daily with a meal.  . Multiple Minerals-Vitamins (BONE ESSENTIALS PO) Take 1 tablet by mouth daily.  . naproxen (NAPROSYN) 500 MG tablet Take 1 tablet (500 mg total) by mouth 2 (two) times daily with a meal.  . potassium chloride (KLOR-CON) 10 MEQ tablet 1 or 2 each am as directed  . RELION PEN NEEDLES 32G X 4 MM MISC USE AS DIRECTED  . rosuvastatin (CRESTOR) 40 MG tablet TAKE 1 TABLET BY MOUTH ONCE DAILY  . [DISCONTINUED] gabapentin (NEURONTIN) 300 MG capsule Take 2 capsules (600 mg total) by mouth 3 (three) times daily.    BP 135/67   Pulse 79   Ht 5' 7"  (1.702 m)   Wt 282 lb (127.9 kg)   BMI 44.17 kg/m   Physical Exam  General appearance: Well-developed well-nourished no gross deformities  Cardiovascular normal pulse and perfusion normal color without edema  Neurologically o sensation loss or deficits or pathologic reflexes  Psychological: Awake alert and oriented x3 mood and affect normal  Skin no lacerations or ulcerations no nodularity no palpable masses, no erythema or nodularity  Musculoskeletal:   Slightly flexed spine from chronic spinal stenosis but ambulates well.  Has a slight limp on the left side.  Left hamstring is tender and tight  Left hip range of motion is normal  Lumbar spine mildly tender  Left shoulder full range of motion normal strength normal stability  X-ray of the shoulder was done at an outside facility she has degenerative arthritis with peripheral bone spurs but the glenohumeral joint is intact and normal     MEDICAL DECISION MAKING  A.  Encounter Diagnoses  Name Primary?  . Displacement of left side of L4-L5  intervertebral disc   . Bursitis of left shoulder Yes  . Left hamstring injury, initial encounter     B. DATA ANALYSED:  Outside film read  Encounter Diagnoses  Name Primary?  . 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 Bursitis of left shoulder Yes  . Left hamstring injury, initial encounter   . Body mass index 40.0-44.9, adult ([place])   . Morbid obesity ([place])     I increased her gabapentin to 600 mg 4 times a day Shoulder Patient education May return no need for surgery at this time  Hamstring normal activities returned slowly ibuprofen as needed  Meds ordered this encounter  Medications  . gabapentin (NEURONTIN) 300 MG capsule    Sig: Take 2 capsules (600 mg total) by mouth 4 (four) times daily.    Dispense:  180 capsule    Refill:  5      Arther Abbott, MD  05/23/2020 9:15 AM

## 2020-06-18 ENCOUNTER — Other Ambulatory Visit: Payer: Self-pay | Admitting: "Endocrinology

## 2020-06-18 ENCOUNTER — Telehealth: Payer: Self-pay

## 2020-06-18 DIAGNOSIS — E782 Mixed hyperlipidemia: Secondary | ICD-10-CM

## 2020-06-18 DIAGNOSIS — E1165 Type 2 diabetes mellitus with hyperglycemia: Secondary | ICD-10-CM

## 2020-06-18 NOTE — Telephone Encounter (Signed)
Ordered

## 2020-06-18 NOTE — Telephone Encounter (Signed)
Patient is asking for an appt, last office visit was 02/03/19, which labs does she need?

## 2020-06-18 NOTE — Telephone Encounter (Signed)
If patient calls back, she needs fasting blood work before an appt. She left a Vm to schedule with Dr Dorris Fetch

## 2020-06-18 NOTE — Telephone Encounter (Signed)
Pt will do bloodwork then call for appt

## 2020-06-19 ENCOUNTER — Telehealth: Payer: Self-pay | Admitting: Family Medicine

## 2020-06-19 NOTE — Telephone Encounter (Signed)
I attempted to leave message for patient to call back and schedule Medicare Annual Wellness Visit (AWV), but she didn't have voice mail.   If patient returns my call, please offer to do virtually or by telephone.   Due for AWVI  Please schedule at anytime with Nurse Health Advisor.

## 2020-07-05 ENCOUNTER — Other Ambulatory Visit: Payer: Self-pay

## 2020-07-06 ENCOUNTER — Encounter: Payer: Self-pay | Admitting: Emergency Medicine

## 2020-07-06 ENCOUNTER — Ambulatory Visit
Admission: EM | Admit: 2020-07-06 | Discharge: 2020-07-06 | Disposition: A | Discharge status: Home / Self Care | Payer: Medicare HMO

## 2020-07-06 DIAGNOSIS — N644 Mastodynia: Secondary | ICD-10-CM

## 2020-07-06 NOTE — Discharge Instructions (Addendum)
Breast ultrasound ordered.  [facility] should be in contact with you to schedule that appointment.  Please call them or the office if you have not heard from them by next week.   Please follow up with PCP following the results of your test(s) Return or go to the ED if you have any new or worsening symptoms

## 2020-07-06 NOTE — ED Triage Notes (Signed)
Bilateral breast pain episode that happened earlier this week.  Denies any pain currently

## 2020-07-06 NOTE — ED Provider Notes (Signed)
[place]   [id number] 07/06/20 Arrival Time: 0806   CC: Breast pain  SUBJECTIVE:  [name] is a 70 y.o. female who presents with a intermittent bilateral breast pain that occurred earlier this week.  Denies trauma, or precipitating event.  Denies pain at this time.  Describes it as pulling sensation.  Denies aggravating or alleviating factors.  Denies similar symptoms in the past.  Denies previous abnormal mammogram.   Denies fever, chills, nausea, vomiting, erythema, skin changes, dimpling, nipple discharge, LAD, SOB, chest pain, abdominal pain, changes in bowel or bladder function.     No LMP recorded. Patient has had a hysterectomy.  ROS: As per HPI.  All other pertinent ROS negative.     Past Medical History:  Diagnosis Date   Arthritis    Essential hypertension    Finger amputation, traumatic Age 89   Axe   Hyperlipidemia    Obesity    PONV (postoperative nausea and vomiting)    Type 2 diabetes mellitus ([place])    Type 2 diabetes mellitus with diabetic neuropathy, unspecified ([place]) 02/04/2018   Past Surgical History:  Procedure Laterality Date   ABDOMINAL HYSTERECTOMY     COLONOSCOPY  12/2008   Dr. Oneida Alar: slightly tortuous colon. internal hemorrhoids. next colonoscopy in 10 years.    HERNIA REPAIR     x2   OOPHORECTOMY     REPLACEMENT TOTAL KNEE Right    TOTAL HIP ARTHROPLASTY Left 2008?   [facility], [place]   TOTAL KNEE ARTHROPLASTY  09/09/2010   Procedure: TOTAL KNEE ARTHROPLASTY;  Surgeon: Arther Abbott, MD;  Location: [facility];  Service: Orthopedics;  Laterality: Left;  With DePuy   Allergies  Allergen Reactions   Penicillins Itching    Has patient had a PCN reaction causing immediate rash, facial/tongue/throat swelling, SOB or lightheadedness with hypotension: No Has patient had a PCN reaction causing severe rash involving mucus membranes or skin necrosis: No Has patient had a PCN reaction that required hospitalization: No Has patient had a PCN  reaction occurring within the last 10 years: No If all of the above answers are "NO", then may proceed with Cephalosporin use.    Tramadol Itching   Hydrocodone Itching   No current facility-administered medications on file prior to encounter.   Current Outpatient Medications on File Prior to Encounter  Medication Sig Dispense Refill   aspirin 81 MG tablet Take 81 mg by mouth daily.     blood glucose meter kit and supplies Dispense based on patient and insurance preference. Use to check glucose up to three times a day. ( For E11.9). 1 each 5   Cinnamon 500 MG capsule Take 500 mg by mouth daily.     Flaxseed, Linseed, (FLAX SEED OIL PO) Take 3 capsules by mouth daily.     furosemide (LASIX) 20 MG tablet TAKE 1 TO 2 TABLETS BY MOUTH ONCE DAILY IN THE MORNING AS NEEDED FOR  PEDAL  EDEMA 90 tablet 1   gabapentin (NEURONTIN) 300 MG capsule Take 2 capsules (600 mg total) by mouth 4 (four) times daily. 180 capsule 5   insulin detemir (LEVEMIR FLEXTOUCH) 100 UNIT/ML FlexPen INJECT 80 UNITS SUBCUTANEOUSLY AT BEDTIME 30 mL 5   Lancets (ONETOUCH DELICA PLUS [id number]) MISC USE 1  TO CHECK GLUCOSE UP TO THREE TIMES DAILY 100 each 5   metFORMIN (GLUCOPHAGE) 500 MG tablet Take 2 tablets (1,000 mg total) by mouth 2 (two) times daily with a meal. 360 tablet 1   Multiple Minerals-Vitamins (  BONE ESSENTIALS PO) Take 1 tablet by mouth daily.     naproxen (NAPROSYN) 500 MG tablet Take 1 tablet (500 mg total) by mouth 2 (two) times daily with a meal. 28 tablet 0   potassium chloride (KLOR-CON) 10 MEQ tablet 1 or 2 each am as directed 180 tablet 1   RELION PEN NEEDLES 32G X 4 MM MISC USE AS DIRECTED 50 each 11   rosuvastatin (CRESTOR) 40 MG tablet TAKE 1 TABLET BY MOUTH ONCE DAILY 90 tablet 1    Social History   Socioeconomic History   Marital status: Single    Spouse name: Not on file   Number of children: 4   Years of education: 9   Highest education level: Not on file  Occupational History    Occupation: retired  Tobacco Use   Smoking status: Never   Smokeless tobacco: Never  Vaping Use   Vaping Use: Never used  Substance and Sexual Activity   Alcohol use: No   Drug use: Never   Sexual activity: Not on file  Other Topics Concern   Not on file  Social History Narrative   Right handed   Drinks caffeine coffee    An apartment down stairs   Social Determinants of Health   Financial Resource Strain: Not on file  Food Insecurity: Not on file  Transportation Needs: Not on file  Physical Activity: Not on file  Stress: Not on file  Social Connections: Not on file  Intimate Partner Violence: Not on file   Family History  Problem Relation Age of Onset   Arthritis Other    Heart attack Sister    Breast cancer Sister    Colon cancer Neg Hx     OBJECTIVE:  Vitals:   07/06/20 0825  BP: 132/76  Pulse: 90  Resp: 16  Temp: 98.2 F (36.8 C)  TempSrc: Oral  SpO2: 96%    General appearance: Alert, NAD, appears stated age Head: NCAT Throat: lips, mucosa, and tongue normal; teeth and gums normal Lungs: CTA bilaterally without adventitious breath sounds Heart: regular rate and rhythm.   Chest: Breast appear symmetrical without obvious skin changes or erythema; no axillary LAD; NTTP, no obvious mass, lumps or cyst appreciated; no nipple inversions or retractions Skin: warm and dry Psychological:  Alert and cooperative. Normal mood and affect.  ASSESSMENT & PLAN:  1. Breast pain    Breast ultrasound ordered.  [facility] should be in contact with you to schedule that appointment.  Please call them or the office if you have not heard from them by next week.   Please follow up with PCP following the results of your test(s) Return or go to the ED if you have any new or worsening symptoms   Reviewed expectations re: course of current medical issues. Questions answered. Outlined signs and symptoms indicating need for more acute intervention. Patient  verbalized understanding. After Visit Summary given.        Lestine Box, PA-C 07/06/20 0840

## 2020-07-12 ENCOUNTER — Telehealth: Payer: Self-pay | Admitting: Family Medicine

## 2020-07-12 DIAGNOSIS — N644 Mastodynia: Secondary | ICD-10-CM

## 2020-07-12 NOTE — Telephone Encounter (Signed)
Please advise. Thank you

## 2020-07-12 NOTE — Telephone Encounter (Signed)
Nurses-patient was seen at urgent care please see urgent care notes #1-please verify that patient is having breast pain Verify is it left, or right or both? With breast pain I recommend diagnostic mammogram and ultrasound to be ordered along with a follow-up office visit within 2 weeks of the test  Also if her insurance requires precertification we may have to bring her by for an office visit before this can be ordered

## 2020-07-12 NOTE — Telephone Encounter (Signed)
Pt calling in stating that she went to the urgent care, Due to her breast because there were no app's. They told her that she needed to reach out to [place] to get a referral. Pt would like a call back about this matter    [facility]

## 2020-07-12 NOTE — Telephone Encounter (Signed)
Diagnostic mammo and ultrasound of both breast scheduled for 08/14/20 at 10:20 at [facility]. Per Urgent Care notes the Breast Center should be reaching out to her regarding ultrasound. Informed pt that if Breast Center calls her for earlier appt, to go ahead and do the Breast center appt. Pt verbalized understanding.

## 2020-08-14 ENCOUNTER — Ambulatory Visit (HOSPITAL_COMMUNITY)
Admission: RE | Admit: 2020-08-14 | Discharge: 2020-08-14 | Disposition: A | Discharge status: Home / Self Care | Payer: Medicare HMO | Source: Ambulatory Visit | Attending: Family Medicine | Admitting: Family Medicine

## 2020-08-14 ENCOUNTER — Ambulatory Visit (HOSPITAL_COMMUNITY): Admission: RE | Admit: 2020-08-14 | Payer: Medicare HMO | Source: Ambulatory Visit

## 2020-08-14 ENCOUNTER — Other Ambulatory Visit: Payer: Self-pay

## 2020-08-14 DIAGNOSIS — N644 Mastodynia: Secondary | ICD-10-CM | POA: Diagnosis not present

## 2020-08-14 DIAGNOSIS — R922 Inconclusive mammogram: Secondary | ICD-10-CM | POA: Diagnosis not present

## 2020-09-03 ENCOUNTER — Other Ambulatory Visit: Payer: Self-pay

## 2020-09-03 ENCOUNTER — Ambulatory Visit (INDEPENDENT_AMBULATORY_CARE_PROVIDER_SITE_OTHER): Payer: Medicare HMO | Admitting: Family Medicine

## 2020-09-03 VITALS — BP 114/72 | HR 71 | Temp 96.9°F | Ht 67.0 in | Wt 284.0 lb

## 2020-09-03 DIAGNOSIS — R2689 Other abnormalities of gait and mobility: Secondary | ICD-10-CM

## 2020-09-03 DIAGNOSIS — L659 Nonscarring hair loss, unspecified: Secondary | ICD-10-CM | POA: Diagnosis not present

## 2020-09-03 DIAGNOSIS — N644 Mastodynia: Secondary | ICD-10-CM | POA: Diagnosis not present

## 2020-09-03 DIAGNOSIS — N898 Other specified noninflammatory disorders of vagina: Secondary | ICD-10-CM

## 2020-09-03 DIAGNOSIS — E119 Type 2 diabetes mellitus without complications: Secondary | ICD-10-CM

## 2020-09-03 DIAGNOSIS — I10 Essential (primary) hypertension: Secondary | ICD-10-CM | POA: Diagnosis not present

## 2020-09-03 NOTE — Progress Notes (Signed)
Subjective:    Patient ID: [name], female    DOB: [birth year], 70 y.o.   MRN: [id number]   -Hair loss- has discontinued all prescription medications since November 2021 due to hair loss Patient has not taken any of the medicine since last fall she states she feels she did not need them she thought they were causing the alopecia she is willing to go back on it if her lab work shows that she needs to Please see below for further discussion Occasionally gets a sharp chest pain in the upper left chest recent mammogram negative. She also had a small nodule in her groin on the left side that she would like to see gynecology for.  In the past she has had a hysterectomy with according to the history in epic had oophorectomy.  Patient needs further evaluation. -Vaginal Itching Left groin area discomfort Use -Follow up breast pain- negative mammo results   Review of Systems     Objective:   Physical Exam General-in no acute distress Eyes-no discharge Lungs-respiratory rate normal, CTA CV-no murmurs,RRR Extremities skin warm dry no edema Neuro grossly normal Behavior normal, alert Alopecia is noted Slight balance issues walking up and down the hall but she certainly has orthopedic issues which makes it more difficult for her to have her balance No tremors no sign of Parkinson's      Assessment & Plan:   1. Hypertension, unspecified type Patient not taking blood pressure medicine but her blood pressure looks very good we will check labs possibly have to reinitiate some medicines after seeing her labs - Hemoglobin A1c - TSH + free T4 - Lipid panel - Basic metabolic panel - Hepatic function panel  2. Type 2 diabetes mellitus with hemoglobin A1c goal of less than 7.5% (HCC) Patient not taking her medicines but try to watch diet she does agree that she would go back on medicine if her numbers indicate the need to do so check labs - Hemoglobin A1c - TSH + free T4 - Lipid  panel - Basic metabolic panel - Hepatic function panel  3. Morbid obesity ([place]) Portion control regular activity try to lose weight - Hemoglobin A1c - TSH + free T4 - Lipid panel - Basic metabolic panel - Hepatic function panel  4. Alopecia Moderate alopecia for the past year patient has come to accept it.  She stated that she stopped all of her medicines because she thought that was causing her alopecia.  We discussed how unlikely that it is. - Hemoglobin A1c - TSH + free T4 - Lipid panel - Basic metabolic panel - Hepatic function panel  5. Balance problem Intermittent balance problems, physical therapy would be beneficial to teach her some exercises to do to improve balance - Ambulatory referral to Physical Therapy  6. Breast pain Occasional breast pain recent mammogram negative.  Patient requesting gynecologic visit.  7. Vaginal itching  Has had a little bit of itching  She states she noticed a small nodule in her privates she wanted to see a gynecologist.  She has a history of a hysterectomy and suturectomy which was back in 2000 at [facility] care everywhere does not have record of this we will try to get her paper chart from our [facility] to see if this reflects that finding but certainly is possible that was before we were taking care of her.  She may need to have an ultrasound when she sees gynecologist regarding this nodule - Ambulatory referral to Gynecology  Patient states she is getting colonoscopy later this year

## 2020-09-04 LAB — HEPATIC FUNCTION PANEL
ALT: 25 IU/L (ref 0–32)
AST: 22 IU/L (ref 0–40)
Albumin: 4 g/dL (ref 3.8–4.8)
Alkaline Phosphatase: 155 IU/L — ABNORMAL HIGH (ref 44–121)
Bilirubin Total: 0.3 mg/dL (ref 0.0–1.2)
Bilirubin, Direct: <0.1 mg/dL (ref 0.00–0.40)
Total Protein: 7.8 g/dL (ref 6.0–8.5)

## 2020-09-04 LAB — LIPID PANEL
Chol/HDL Ratio: 7.3 ratio — ABNORMAL HIGH (ref 0.0–4.4)
Cholesterol, Total: 341 mg/dL — ABNORMAL HIGH (ref 100–199)
HDL: 47 mg/dL (ref >39)
LDL Chol Calc (NIH): 256 mg/dL — ABNORMAL HIGH (ref 0–99)
Triglycerides: 190 mg/dL — ABNORMAL HIGH (ref 0–149)
VLDL Cholesterol Cal: 38 mg/dL (ref 5–40)

## 2020-09-04 LAB — BASIC METABOLIC PANEL
BUN/Creatinine Ratio: 11 — ABNORMAL LOW (ref 12–28)
BUN: 10 mg/dL (ref 8–27)
CO2: 20 mmol/L (ref 20–29)
Calcium: 9.5 mg/dL (ref 8.7–10.3)
Chloride: 98 mmol/L (ref 96–106)
Creatinine, Ser: 0.95 mg/dL (ref 0.57–1.00)
Glucose: 361 mg/dL — ABNORMAL HIGH (ref 65–99)
Potassium: 4.6 mmol/L (ref 3.5–5.2)
Sodium: 135 mmol/L (ref 134–144)
eGFR: 64 mL/min/{1.73_m2} (ref >59)

## 2020-09-04 LAB — HEMOGLOBIN A1C
Est. average glucose Bld gHb Est-mCnc: 390 mg/dL
Hgb A1c MFr Bld: 15.2 % — ABNORMAL HIGH (ref 4.8–5.6)

## 2020-09-04 LAB — TSH+FREE T4
Free T4: 1.15 ng/dL (ref 0.82–1.77)
TSH: 1.55 u[IU]/mL (ref 0.450–4.500)

## 2020-09-10 ENCOUNTER — Other Ambulatory Visit: Payer: Self-pay

## 2020-09-10 DIAGNOSIS — E119 Type 2 diabetes mellitus without complications: Secondary | ICD-10-CM

## 2020-09-10 MED ORDER — METFORMIN HCL 500 MG PO TABS: 500.0000 mg | ORAL_TABLET | Freq: Two times a day (BID) | ORAL | 0 refills | Status: DC

## 2020-09-24 ENCOUNTER — Ambulatory Visit (HOSPITAL_COMMUNITY): Payer: Medicare HMO | Attending: Family Medicine | Admitting: Physical Therapy

## 2020-09-24 ENCOUNTER — Other Ambulatory Visit: Payer: Self-pay

## 2020-09-24 ENCOUNTER — Encounter (HOSPITAL_COMMUNITY): Payer: Self-pay | Admitting: Physical Therapy

## 2020-09-24 DIAGNOSIS — R2689 Other abnormalities of gait and mobility: Secondary | ICD-10-CM | POA: Insufficient documentation

## 2020-09-24 NOTE — Patient Instructions (Signed)
Access Code: [email or web address] URL: [email or web address] Date: 09/24/2020 Prepared by: Josue Hector  Exercises Sit to Stand with Counter Support - 2 x daily - 7 x weekly - 3 sets - 5 reps Standing Tandem Balance with Counter Support - 2 x daily - 7 x weekly - 1 sets - 3 reps - 20 second hold Heel Raises with Counter Support - 2 x daily - 7 x weekly - 2 sets - 10 reps

## 2020-09-24 NOTE — Therapy (Signed)
[place], [place], [address] Phone: [phone]   Fax:  [phone]  Physical Therapy Evaluation  Patient Details  Name: [name] MRN: [id number] Date of Birth: 26-Nov-1950 Referring Provider (PT): Sallee Lange MD   Encounter Date: 09/24/2020   PT End of Session - 09/24/20 1100     Visit Number 1    Number of Visits 8    Date for PT Re-Evaluation 10/22/20    Authorization Type [employer]    Authorization Time Period Check auth    PT Start Time 1030    PT Stop Time 1110    PT Time Calculation (min) 40 min    Activity Tolerance Patient tolerated treatment well;Patient limited by fatigue    Behavior During Therapy [facility] for tasks assessed/performed             Past Medical History:  Diagnosis Date   Arthritis    Essential hypertension    Finger amputation, 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 Oneida Alar: slightly tortuous colon. internal hemorrhoids. next colonoscopy in 10 years.    HERNIA REPAIR     x2   OOPHORECTOMY     REPLACEMENT TOTAL KNEE Right    TOTAL HIP ARTHROPLASTY Left 2008?   [facility], [place]   TOTAL KNEE ARTHROPLASTY  09/09/2010   Procedure: TOTAL KNEE ARTHROPLASTY;  Surgeon: Arther Abbott, MD;  Location: [facility];  Service: Orthopedics;  Laterality: Left;  With DePuy    There were no vitals filed for this visit.    Subjective Assessment - 09/24/20 1035     Subjective Patient presents to therapy with complaint of balance difficulty. She states things have gotten progressively worse over the past year. She does use AD such as a cane as needed and going long distances. Denies recent falls.     Limitations Standing;Walking;House hold activities    Patient Stated Goals "I dont want to stagger"    Currently in Pain? No/denies                [facility] PT Assessment - 09/24/20 0001       Assessment   Medical Diagnosis Balance difficulty    Referring Provider (PT) Sallee Lange MD    Prior Therapy Yes      Precautions   Precautions Fall      Restrictions   Weight Bearing Restrictions No      Balance Screen   Has the patient fallen in the past 6 months No      [place] residence      Prior Function   Level of Independence Independent with basic ADLs      Cognition   Overall Cognitive Status Within Functional Limits for tasks assessed      ROM / Strength   AROM / PROM / Strength Strength      Strength   Strength Assessment Site Hip;Knee;Ankle    Right/Left Hip Right;Left    Right Hip Flexion 4+/5    Right Hip ABduction 4+/5    Left Hip Flexion 4+/5    Left Hip ABduction 4+/5  Right/Left Knee Right;Left    Right Knee Extension 4+/5    Left Knee Extension 4+/5    Right/Left Ankle Right;Left    Right Ankle Dorsiflexion 5/5    Left Ankle Dorsiflexion 5/5      Transfers   Five time sit to stand comments  17.4 sec with min use of UEs      Ambulation/Gait   Ambulation/Gait Yes    Ambulation/Gait Assistance 5: Supervision    Ambulation Distance (Feet) 265 Feet    Assistive device None    Gait Pattern Decreased stride length;Decreased step length - right;Decreased step length - left;Trendelenburg    Ambulation Surface Level;Indoor    Gait velocity Decreased    Gait Comments 2MWT      Standardized Balance Assessment   Standardized Balance Assessment Berg Balance Test      Berg Balance Test   Sit to Stand Able to stand without using hands and stabilize independently    Standing Unsupported Able to stand safely 2 minutes    Sitting with Back Unsupported but Feet Supported on Floor or Stool Able to sit safely and  securely 2 minutes    Stand to Sit Sits safely with minimal use of hands    Transfers Able to transfer safely, minor use of hands    Standing Unsupported with Eyes Closed Able to stand 10 seconds safely    Standing Unsupported with Feet Together Able to place feet together independently but unable to hold for 30 seconds    From Standing, Reach Forward with Outstretched Arm Can reach forward >12 cm safely (5")    From Standing Position, Pick up Object from Floor Able to pick up shoe, needs supervision    From Standing Position, Turn to Look Behind Over each Shoulder Looks behind one side only/other side shows less weight shift    Turn 360 Degrees Able to turn 360 degrees safely one side only in 4 seconds or less    Standing Unsupported, Alternately Place Feet on Step/Stool Able to stand independently and safely and complete 8 steps in 20 seconds    Standing Unsupported, One Foot in Front Able to take small step independently and hold 30 seconds    Standing on One Leg Able to lift leg independently and hold equal to or more than 3 seconds    Total Score 46                        Objective measurements completed on examination: See above findings.                PT Education - 09/24/20 1035     Education Details on evalution findings, POC and HEP    Person(s) Educated Patient    Methods Explanation;Handout    Comprehension Verbalized understanding              PT Short Term Goals - 09/24/20 1103       PT SHORT TERM GOAL #1   Title Patient will be independent with initial HEP and self-management strategies to improve functional outcomes    Time 2    Period Weeks    Status New    Target Date 10/08/20               PT Long Term Goals - 09/24/20 1103       PT LONG TERM GOAL #1   Title Patient will be independent with advanced HEP and self-management strategies to improve functional  outcomes    Time 4    Period Weeks    Status New    Target  Date 10/22/20      PT LONG TERM GOAL #2   Title Patient will be able to perform stand x 5 in < 12 seconds to demonstrate improvement in functional mobility and reduced risk for falls.    Time 4    Period Weeks    Status New    Target Date 10/22/20      PT LONG TERM GOAL #3   Title Patient will report at least 75% overall improvement in subjective complaint to indicate improvement in ability to perform ADLs.    Time 4    Period Weeks    Status New    Target Date 10/22/20      PT LONG TERM GOAL #4   Title Patient will be able to ambulate at least 310 feet during 2MWT with LRAD to demonstrate improved ability to perform functional mobility and associated tasks.    Time 4    Period Weeks    Status New    Target Date 10/22/20      PT LONG TERM GOAL #5   Title Patient will improve BERG score by at least 5 points to indicate improvement in balance and functional outcomes    Time 4    Period Weeks    Status New    Target Date 10/22/20                    Plan - 09/24/20 1100     Clinical Impression Statement Patient is a 70 y.o. female who presents to physical therapy with complaint of balance difficulty. Patient demonstrates decreased strength, balance deficits and gait abnormalities which are negatively impacting patient ability to perform ADLs and functional mobility tasks. Patient will benefit from skilled physical therapy services to address these deficits to improve level of function with ADLs, functional mobility tasks, and reduce risk for falls.    Examination-Activity Limitations Transfers;Stairs;Locomotion Level    Examination-Participation Restrictions Cleaning;Yard Work;Community Activity;Laundry;Church    Stability/Clinical Decision Making Stable/Uncomplicated    Clinical Decision Making Low    Rehab Potential Good    PT Frequency 2x / week    PT Duration 4 weeks    PT Treatment/Interventions ADLs/Self Care Home Management;Aquatic  Therapy;Biofeedback;Cryotherapy;Electrical Stimulation;Contrast Bath;Fluidtherapy;Parrafin;Therapeutic activities;Therapeutic exercise;Orthotic Fit/Training;Patient/family education;Functional mobility training;Neuromuscular re-education;Stair training;Iontophoresis '4mg'$ /ml Dexamethasone;Moist Heat;Traction;Ultrasound;DME Instruction;Gait training;Balance training;Scar mobilization;Passive range of motion;Vestibular;Visual/perceptual remediation/compensation;Dry needling;Manual techniques;Manual lymph drainage;Energy conservation;Splinting;Taping;Compression bandaging;Vasopneumatic Device;Joint Manipulations;Spinal Manipulations    PT Next Visit Plan Review goals and HEP. Progress functional strength and balance as tolerated. Static>dynamic> gait    PT Home Exercise Plan Eval: sit to stand, tandem stance at counter, heel raise    Consulted and Agree with Plan of Care Patient             Patient will benefit from skilled therapeutic intervention in order to improve the following deficits and impairments:  Improper body mechanics, Abnormal gait, Decreased balance, Difficulty walking, Decreased activity tolerance, Decreased endurance, Decreased strength, Decreased mobility  Visit Diagnosis: Other abnormalities of gait and mobility     Problem List Patient Active Problem List   Diagnosis Date Noted   Encounter for screening colonoscopy 12/13/2019   Occipital neuralgia of left side 06/24/2019   Chronic intractable headache 06/24/2019   Uncontrolled type 2 diabetes mellitus with hyperglycemia ([place]) 01/19/2019   S/P total knee replacement, right 2007 01/12/2019   Special screening for malignant neoplasms, colon 12/22/2018  Type 2 diabetes mellitus with diabetic neuropathy, unspecified ([place]) 02/04/2018   Carpal tunnel syndrome 05/16/2015   Morbid obesity ([place]) 03/13/2015   Elevated alkaline phosphatase level 09/15/2013   HTN (hypertension) 09/14/2013   Type 2 diabetes mellitus with  hemoglobin A1c goal of less than 7.5% ([place]) 10/04/2012   Lumbago with sciatica of left side 10/22/2010   Difficulty in walking(719.7) 10/16/2010   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 09/23/2010   Acquired trigger finger 08/15/2010   OA (osteoarthritis) of knee 08/15/2010   KNEE PAIN 05/26/2007   S/P hip replacement, left 05/26/2007   DEGENERATIVE JOINT DISEASE, LEFT HIP 12/03/2006   11:10 AM, 09/24/20 Josue Hector PT DPT  Physical Therapist with [facility]  [phone] [place], [place], [address] Phone: [phone]   Fax:  [phone]  Name: [name] MRN: [id number] Date of Birth: 13-Apr-1950

## 2020-09-26 ENCOUNTER — Ambulatory Visit (HOSPITAL_COMMUNITY): Payer: Medicare HMO

## 2020-09-26 ENCOUNTER — Encounter (HOSPITAL_COMMUNITY): Payer: Self-pay

## 2020-09-26 ENCOUNTER — Other Ambulatory Visit: Payer: Self-pay

## 2020-09-26 DIAGNOSIS — R2689 Other abnormalities of gait and mobility: Secondary | ICD-10-CM | POA: Diagnosis not present

## 2020-09-26 NOTE — Therapy (Signed)
[place], [place], [address] Phone: [phone]   Fax:  [phone]  Physical Therapy Treatment  Patient Details  Name: [name] MRN: [id number] Date of Birth: 1950-04-18 Referring Provider (PT): Sallee Lange MD   Encounter Date: 09/26/2020   PT End of Session - 09/26/20 0936     Visit Number 2    Number of Visits 8    Date for PT Re-Evaluation 10/22/20    Authorization Type [employer]    PT Start Time 0930   late arrival   PT Stop Time 0959    PT Time Calculation (min) 29 min    Activity Tolerance Patient tolerated treatment well;Patient limited by fatigue    Behavior During Therapy [facility] for tasks assessed/performed             Past Medical History:  Diagnosis Date   Arthritis    Essential hypertension    Finger amputation, 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) 02/04/2018    Past Surgical History:  Procedure Laterality Date   ABDOMINAL HYSTERECTOMY     COLONOSCOPY  12/2008   Dr. Oneida Alar: slightly tortuous colon. internal hemorrhoids. next colonoscopy in 10 years.    HERNIA REPAIR     x2   OOPHORECTOMY     REPLACEMENT TOTAL KNEE Right    TOTAL HIP ARTHROPLASTY Left 2008?   [facility], [place]   TOTAL KNEE ARTHROPLASTY  09/09/2010   Procedure: TOTAL KNEE ARTHROPLASTY;  Surgeon: Arther Abbott, MD;  Location: [facility];  Service: Orthopedics;  Laterality: Left;  With DePuy    There were no vitals filed for this visit.   Subjective Assessment - 09/26/20 0930     Subjective Pt stated she is feeling fine.  No reports of pain or recent fall.  Reports she has began the HEP wihtout any questions.    Patient Stated Goals "I dont want to stagger"    Currently in Pain? No/denies                                [facility] Adult PT Treatment/Exercise - 09/26/20 0001       Exercises   Exercises Knee/Hip      Knee/Hip Exercises: Standing   Heel Raises 10 reps;3 seconds   no HHA   Heel Raises Limitations Toe raises 10x with HHA    Hip Flexion Both;10 reps    Hip Flexion Limitations toe tapping alternating 10x each on 6in step    Functional Squat 2 sets;5 reps;3 seconds    SLS 5x Rt 7", Lt 8"    Other Standing Knee Exercises tandem stance 30"    Other Standing Knee Exercises sidestep 2RT      Knee/Hip Exercises: Seated   Sit to Sand 10 reps;without UE support   eccentric control                    PT Education - 09/26/20 0940     Education Details Reviewed goals, educated importance of HEP complaince for maximal benefits, pt able to recall and demonstrate appropriate mechanics.    Person(s) Educated Patient    Methods Explanation    Comprehension Verbalized understanding;Returned demonstration  PT Short Term Goals - 09/24/20 1103       PT SHORT TERM GOAL #1   Title Patient will be independent with initial HEP and self-management strategies to improve functional outcomes    Time 2    Period Weeks    Status New    Target Date 10/08/20               PT Long Term Goals - 09/24/20 1103       PT LONG TERM GOAL #1   Title Patient will be independent with advanced HEP and self-management strategies to improve functional outcomes    Time 4    Period Weeks    Status New    Target Date 10/22/20      PT LONG TERM GOAL #2   Title Patient will be able to perform stand x 5 in < 12 seconds to demonstrate improvement in functional mobility and reduced risk for falls.    Time 4    Period Weeks    Status New    Target Date 10/22/20      PT LONG TERM GOAL #3   Title Patient will report at least 75% overall improvement in subjective complaint to indicate improvement in ability to perform ADLs.    Time 4    Period Weeks    Status New    Target Date 10/22/20       PT LONG TERM GOAL #4   Title Patient will be able to ambulate at least 310 feet during 2MWT with LRAD to demonstrate improved ability to perform functional mobility and associated tasks.    Time 4    Period Weeks    Status New    Target Date 10/22/20      PT LONG TERM GOAL #5   Title Patient will improve BERG score by at least 5 points to indicate improvement in balance and functional outcomes    Time 4    Period Weeks    Status New    Target Date 10/22/20                   Plan - 09/26/20 0943     Clinical Impression Statement Reviewed goals, educated importance of HEP complaince for maximal benefits, pt able to recall and demonstrate appropriate mechanics.  Session focus on proximal strengthening and balance training.  Pt able to complete all exercises correctly following cueing for form, mechanics and slow controlled movements.  Pt was limited by fatigue, no reports of pain.    Examination-Activity Limitations Transfers;Stairs;Locomotion Level    Examination-Participation Restrictions Cleaning;Yard Work;Community Activity;Laundry;Church    Stability/Clinical Decision Making Stable/Uncomplicated    Clinical Decision Making Low    Rehab Potential Good    PT Frequency 2x / week    PT Duration 4 weeks    PT Treatment/Interventions ADLs/Self Care Home Management;Aquatic Therapy;Biofeedback;Cryotherapy;Electrical Stimulation;Contrast Bath;Fluidtherapy;Parrafin;Therapeutic activities;Therapeutic exercise;Orthotic Fit/Training;Patient/family education;Functional mobility training;Neuromuscular re-education;Stair training;Iontophoresis '4mg'$ /ml Dexamethasone;Moist Heat;Traction;Ultrasound;DME Instruction;Gait training;Balance training;Scar mobilization;Passive range of motion;Vestibular;Visual/perceptual remediation/compensation;Dry needling;Manual techniques;Manual lymph drainage;Energy conservation;Splinting;Taping;Compression bandaging;Vasopneumatic Device;Joint Manipulations;Spinal  Manipulations    PT Next Visit Plan Progress functional strength and balance as tolerated. Static>dynamic> gait.  Add ressistance with sidestepping and begin dynamic surface next session.    PT Home Exercise Plan Eval: sit to stand, tandem stance at counter, heel raise    Consulted and Agree with Plan of Care Patient             Patient will benefit from skilled therapeutic intervention in order to  improve the following deficits and impairments:  Improper body mechanics, Abnormal gait, Decreased balance, Difficulty walking, Decreased activity tolerance, Decreased endurance, Decreased strength, Decreased mobility  Visit Diagnosis: Other abnormalities of gait and mobility     Problem List Patient Active Problem List   Diagnosis Date Noted   Encounter for screening colonoscopy 12/13/2019   Occipital neuralgia of left side 06/24/2019   Chronic intractable headache 06/24/2019   Uncontrolled type 2 diabetes mellitus with hyperglycemia ([place]) 01/19/2019   S/P total knee replacement, right 2007 01/12/2019   Special screening for malignant neoplasms, colon 12/22/2018   Type 2 diabetes mellitus with diabetic neuropathy, unspecified ([place]) 02/04/2018   Carpal tunnel syndrome 05/16/2015   Morbid obesity ([place]) 03/13/2015   Elevated alkaline phosphatase level 09/15/2013   HTN (hypertension) 09/14/2013   Type 2 diabetes mellitus with hemoglobin A1c goal of less than 7.5% ([place]) 10/04/2012   Lumbago with sciatica of left side 10/22/2010   Difficulty in walking(719.7) 10/16/2010   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 09/23/2010   Acquired trigger finger 08/15/2010   OA (osteoarthritis) of knee 08/15/2010   KNEE PAIN 05/26/2007   S/P hip replacement, left 05/26/2007   DEGENERATIVE JOINT DISEASE, LEFT HIP 12/03/2006   Ihor Austin, LPTA/CLT; CBIS [phone]  Aldona Lento, PTA 09/26/2020, 10:08 AM  [place], [place], [address] Phone: [phone]   Fax:  [phone]  Name: Tashauna JOCLYNN PEDALINO MRN: [id number] Date of Birth: 1951/01/11

## 2020-09-30 IMAGING — MG DIGITAL SCREENING BILAT W/ TOMO W/ CAD
6 of 10 series · 6 of 30 positions shown · non-contrast
Comparison: Previous exam(s).

CLINICAL DATA: Screening.

EXAM:
DIGITAL SCREENING BILATERAL MAMMOGRAM WITH TOMO AND CAD

[R MLO synth-2D]
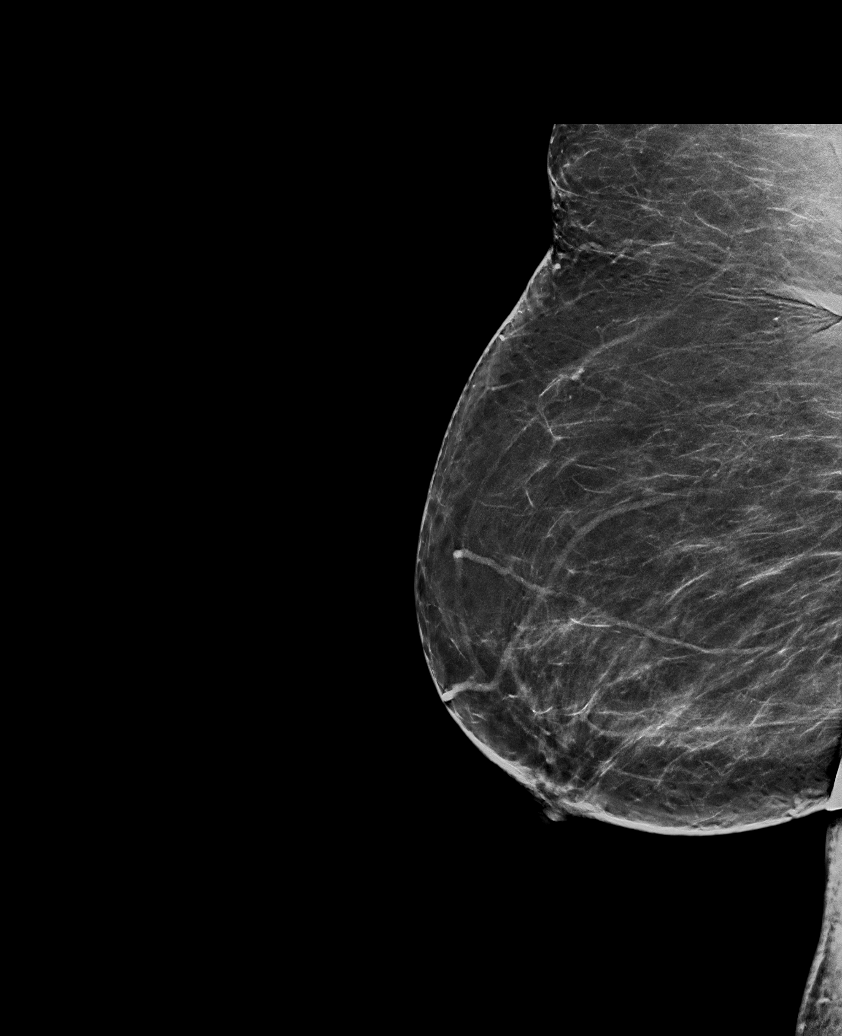

[L MLO synth-2D (1 of 2)]
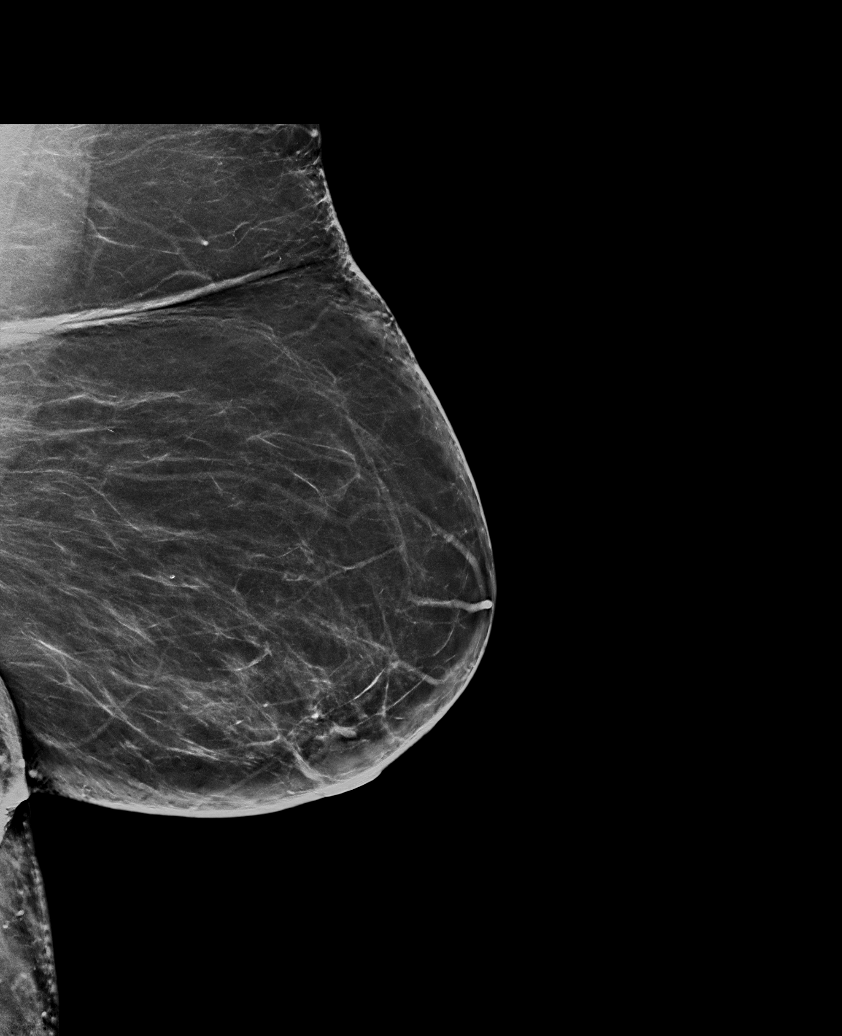

[R CC synth-2D]
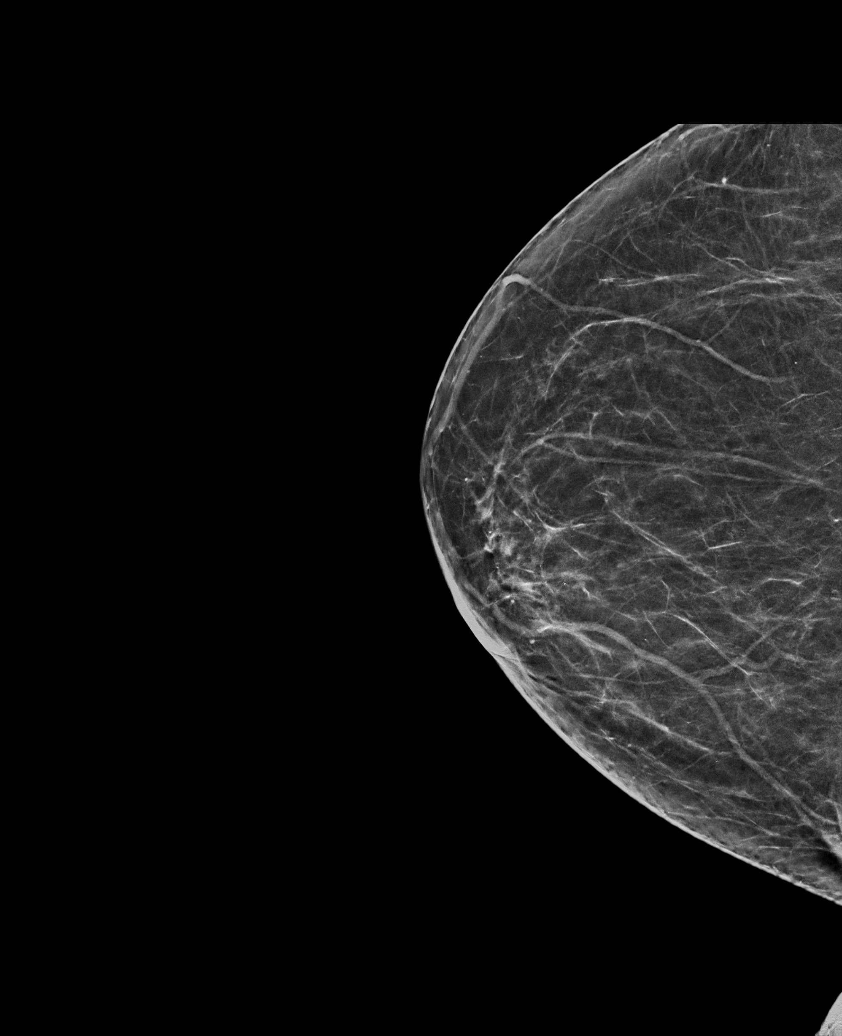

[L CC synth-2D]
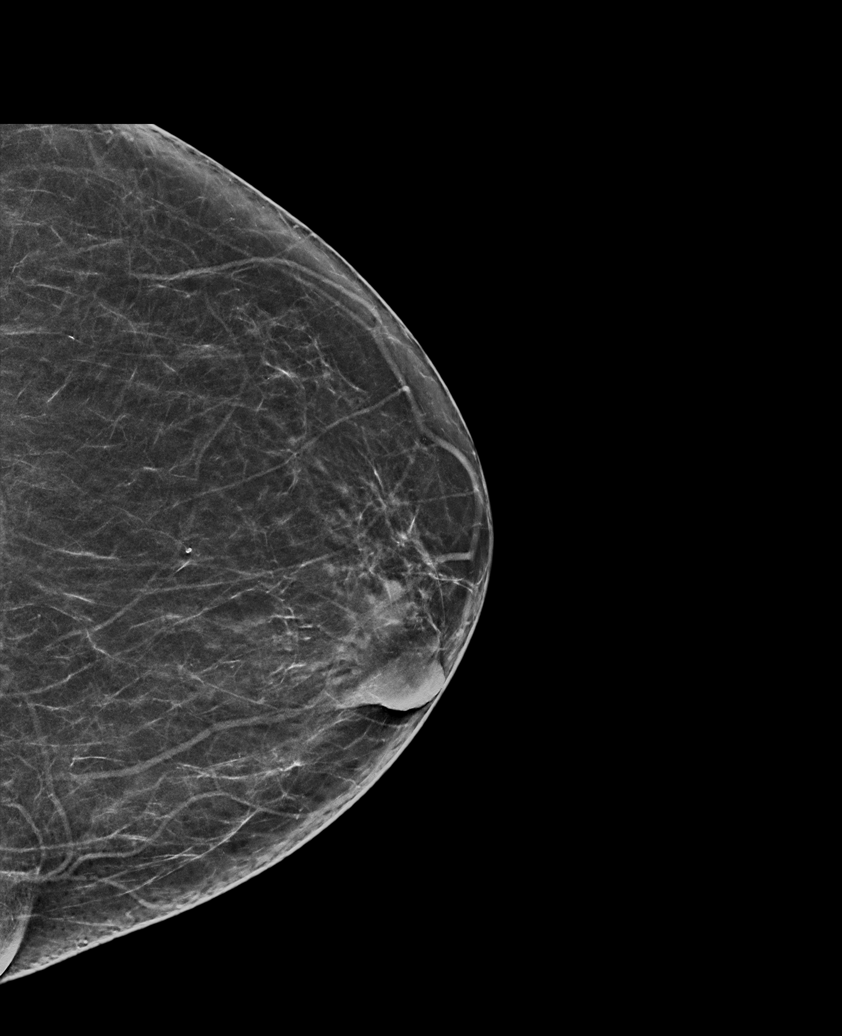

[L MLO synth-2D (2 of 2)]
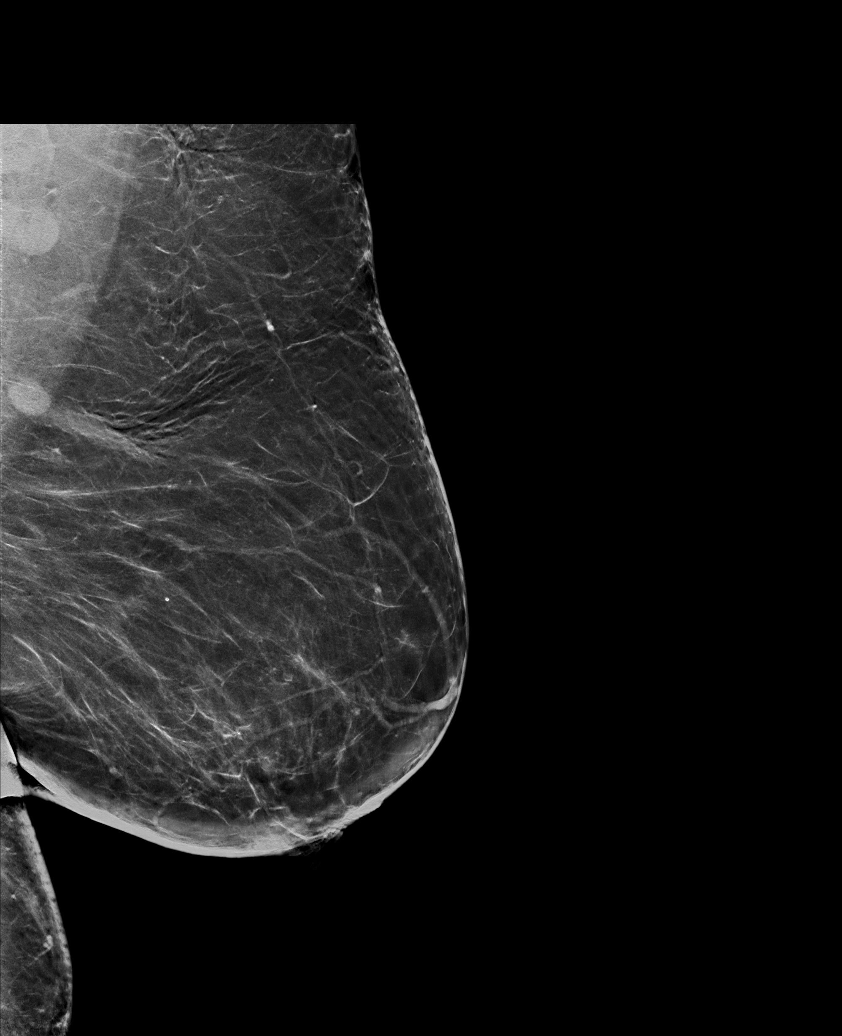

[L CC tomo · tomo slice 32/63.0]
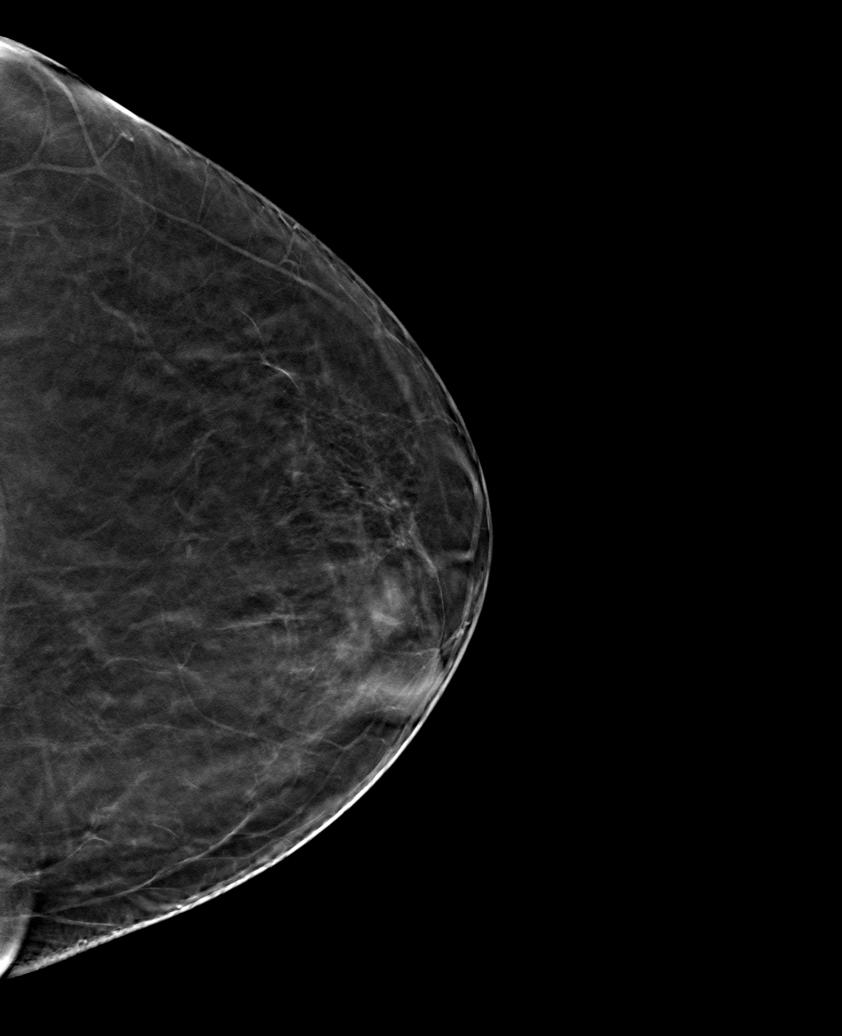

[6 of 30 positions shown; findings below may reference images not displayed]

ACR Breast Density Category b: There are scattered areas of
fibroglandular density.
FINDINGS: There are no findings suspicious for malignancy. Images were
processed with CAD.
IMPRESSION: No mammographic evidence of malignancy. A result letter of this
screening mammogram will be mailed directly to the patient.

RECOMMENDATION:
Screening mammogram in one year. (Code:[id number])

BI-RADS CATEGORY  1: Negative.

## 2020-10-01 ENCOUNTER — Other Ambulatory Visit: Payer: Self-pay

## 2020-10-01 ENCOUNTER — Ambulatory Visit (HOSPITAL_COMMUNITY): Payer: Medicare HMO

## 2020-10-01 DIAGNOSIS — R2689 Other abnormalities of gait and mobility: Secondary | ICD-10-CM | POA: Diagnosis not present

## 2020-10-01 NOTE — Therapy (Signed)
[place], [place], [address] Phone: [phone]   Fax:  [phone]  Physical Therapy Treatment  Patient Details  Name: [name] MRN: [id number] Date of Birth: [birth year] Referring Provider (PT): Sallee Lange MD   Encounter Date: 10/01/2020   PT End of Session - 10/01/20 0815     Visit Number 3    Number of Visits 8    Date for PT Re-Evaluation 10/22/20    Authorization Type [employer]    PT Start Time 0815    PT Stop Time 0900    PT Time Calculation (min) 45 min    Activity Tolerance Patient tolerated treatment well;Patient limited by fatigue    Behavior During Therapy [facility] for tasks assessed/performed             Past Medical History:  Diagnosis Date   Arthritis    Essential hypertension    Finger amputation, 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 Oneida Alar: slightly tortuous colon. internal hemorrhoids. next colonoscopy in 10 years.    HERNIA REPAIR     x2   OOPHORECTOMY     REPLACEMENT TOTAL KNEE Right    TOTAL HIP ARTHROPLASTY Left 2008?   [facility], [place]   TOTAL KNEE ARTHROPLASTY  09/09/2010   Procedure: TOTAL KNEE ARTHROPLASTY;  Surgeon: Arther Abbott, MD;  Location: [facility];  Service: Orthopedics;  Laterality: Left;  With DePuy    There were no vitals filed for this visit.   Subjective Assessment - 10/01/20 0816     Subjective Feeling pretty good, no slips trips or falls.    Patient Stated Goals "I dont want to stagger"    Currently in Pain? No/denies                [facility] PT Assessment - 10/01/20 0001       Assessment   Medical Diagnosis Balance difficulty                            [facility] Adult PT Treatment/Exercise - 10/01/20 0001       Neuro Re-ed    Neuro Re-ed Details  Forward-backward walking in // bars to react to changing surface and improve proprioception. DF/PF rocking on towel roll to facilitate ankle strategy. Walking around // bars balancing ball on tray while negotiating turns 4x20 ft. Staggered stance throwing/bouncing/catching tennis ball 2x10. Static standing on airex pad throwing/catching tennis ball 10x for challenge to ankle strategy. Trapping/kicking playground ball to facilitate weight shifting and single limb support      Knee/Hip Exercises: Standing   Heel Raises Both;2 sets;10 reps    Hip Flexion Stengthening;Both;3 sets;5 reps    Hip Flexion Limitations hip flexion+knee touch    Gait Training Retro-walking x2 min    Other Standing Knee Exercises tandem stance 3x15 sec left/right    Other Standing Knee Exercises sidestepping over towel roll x 2 min.      Knee/Hip Exercises: Seated   Sit to Sand without UE support;15 reps  PT Short Term Goals - 09/24/20 1103       PT SHORT TERM GOAL #1   Title Patient will be independent with initial HEP and self-management strategies to improve functional outcomes    Time 2    Period Weeks    Status New    Target Date 10/08/20               PT Long Term Goals - 09/24/20 1103       PT LONG TERM GOAL #1   Title Patient will be independent with advanced HEP and self-management strategies to improve functional outcomes    Time 4    Period Weeks    Status New    Target Date 10/22/20      PT LONG TERM GOAL #2   Title Patient will be able to perform stand x 5 in < 12 seconds to demonstrate improvement in functional mobility and reduced risk for falls.    Time 4    Period Weeks    Status New    Target Date 10/22/20      PT LONG TERM GOAL #3   Title Patient will report at least 75% overall improvement in subjective complaint to  indicate improvement in ability to perform ADLs.    Time 4    Period Weeks    Status New    Target Date 10/22/20      PT LONG TERM GOAL #4   Title Patient will be able to ambulate at least 310 feet during 2MWT with LRAD to demonstrate improved ability to perform functional mobility and associated tasks.    Time 4    Period Weeks    Status New    Target Date 10/22/20      PT LONG TERM GOAL #5   Title Patient will improve BERG score by at least 5 points to indicate improvement in balance and functional outcomes    Time 4    Period Weeks    Status New    Target Date 10/22/20                   Plan - 10/01/20 0857     Clinical Impression Statement Demo initial difficulty with righting reactions via ankle strategy. Improved spontaneous righting with trials.  Progressing with dynamic single limb support and weight shifting without LOB.  Pt notes fatigue/discomfort in her knees which can cause some unsteadiness. Progressing with balance reactions on compliant surfaces. Continued POC indicated to progress BLE strength and balance activities to reduce risk for falls    Examination-Activity Limitations Transfers;Stairs;Locomotion Level    Examination-Participation Restrictions Cleaning;Yard Work;Community Activity;Laundry;Church    Stability/Clinical Decision Making Stable/Uncomplicated    Rehab Potential Good    PT Frequency 2x / week    PT Duration 4 weeks    PT Treatment/Interventions ADLs/Self Care Home Management;Aquatic Therapy;Biofeedback;Cryotherapy;Electrical Stimulation;Contrast Bath;Fluidtherapy;Parrafin;Therapeutic activities;Therapeutic exercise;Orthotic Fit/Training;Patient/family education;Functional mobility training;Neuromuscular re-education;Stair training;Iontophoresis '4mg'$ /ml Dexamethasone;Moist Heat;Traction;Ultrasound;DME Instruction;Gait training;Balance training;Scar mobilization;Passive range of motion;Vestibular;Visual/perceptual remediation/compensation;Dry  needling;Manual techniques;Manual lymph drainage;Energy conservation;Splinting;Taping;Compression bandaging;Vasopneumatic Device;Joint Manipulations;Spinal Manipulations    PT Next Visit Plan Progress functional strength and balance as tolerated. Static>dynamic> gait.  Add ressistance with sidestepping and begin dynamic surface next session.    PT Home Exercise Plan Eval: sit to stand, tandem stance at counter, heel raise    Consulted and Agree with Plan of Care Patient             Patient will benefit from skilled therapeutic intervention in order to improve  the following deficits and impairments:  Improper body mechanics, Abnormal gait, Decreased balance, Difficulty walking, Decreased activity tolerance, Decreased endurance, Decreased strength, Decreased mobility  Visit Diagnosis: Other abnormalities of gait and mobility     Problem List Patient Active Problem List   Diagnosis Date Noted   Encounter for screening colonoscopy 12/13/2019   Occipital neuralgia of left side 06/24/2019   Chronic intractable headache 06/24/2019   Uncontrolled type 2 diabetes mellitus with hyperglycemia ([place]) 01/19/2019   S/P total knee replacement, right 2007 01/12/2019   Special screening for malignant neoplasms, colon 12/22/2018   Type 2 diabetes mellitus with diabetic neuropathy, unspecified ([place]) 02/04/2018   Carpal tunnel syndrome 05/16/2015   Morbid obesity ([place]) 03/13/2015   Elevated alkaline phosphatase level 09/15/2013   HTN (hypertension) 09/14/2013   Type 2 diabetes mellitus with hemoglobin A1c goal of less than 7.5% ([place]) 10/04/2012   Lumbago with sciatica of left side 10/22/2010   Difficulty in walking(719.7) 10/16/2010   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 09/23/2010   Acquired trigger finger 08/15/2010   OA (osteoarthritis) of knee 08/15/2010   KNEE PAIN 05/26/2007   S/P hip replacement, left 05/26/2007   DEGENERATIVE JOINT DISEASE, LEFT HIP 12/03/2006    Toniann Fail,  PT 10/01/2020, 9:00 AM  [facility] [address] [place], [place], [address] Phone: [phone]   Fax:  [phone]  Name: [name] MRN: [id number] Date of Birth: [birth year]

## 2020-10-04 ENCOUNTER — Ambulatory Visit (HOSPITAL_COMMUNITY): Payer: Medicare HMO

## 2020-10-04 ENCOUNTER — Encounter (HOSPITAL_COMMUNITY): Payer: Self-pay

## 2020-10-04 ENCOUNTER — Other Ambulatory Visit: Payer: Self-pay

## 2020-10-04 DIAGNOSIS — R2689 Other abnormalities of gait and mobility: Secondary | ICD-10-CM

## 2020-10-04 NOTE — Therapy (Signed)
[place], [place], [address] Phone: [phone]   Fax:  [phone]  Physical Therapy Treatment  Patient Details  Name: [name] MRN: [id number] Date of Birth: 09-21-1950 Referring Provider (PT): Sallee Lange MD   Encounter Date: 10/04/2020   PT End of Session - 10/04/20 0905     Visit Number 4    Number of Visits 8    Date for PT Re-Evaluation 10/22/20    Authorization Type [employer]    PT Start Time 0903    PT Stop Time 0943    PT Time Calculation (min) 40 min    Activity Tolerance Patient tolerated treatment well    Behavior During Therapy [facility] for tasks assessed/performed             Past Medical History:  Diagnosis Date   Arthritis    Essential hypertension    Finger amputation, 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 Oneida Alar: slightly tortuous colon. internal hemorrhoids. next colonoscopy in 10 years.    HERNIA REPAIR     x2   OOPHORECTOMY     REPLACEMENT TOTAL KNEE Right    TOTAL HIP ARTHROPLASTY Left 2008?   [facility], [place]   TOTAL KNEE ARTHROPLASTY  09/09/2010   Procedure: TOTAL KNEE ARTHROPLASTY;  Surgeon: Arther Abbott, MD;  Location: [facility];  Service: Orthopedics;  Laterality: Left;  With DePuy    There were no vitals filed for this visit.   Subjective Assessment - 10/04/20 0904     Subjective Pt reports nothing new, no pain, "everything is ok"    Currently in Pain? No/denies                  [facility] Adult PT Treatment/Exercise - 10/04/20 0001       Neuro Re-ed    Neuro Re-ed Details  Weighted walking backwards and sideways, 40#, 8 ft distance, x10 RT; weaving around cones  forward; sidestepping with foot taps to cone      Knee/Hip Exercises: Standing   Heel Raises 15 reps    Heel Raises Limitations toes parallel, toes in, toes out, 15 reps each    Forward Lunges Both;15 reps    Forward Lunges Limitations mini lunge forward, return to feet together      Knee/Hip Exercises: Seated   Sit to Sand without UE support;15 reps   gait belt around thighs for hip abduction                    PT Education - 10/04/20 0905     Education Details Exercise technique, continue HEP    Person(s) Educated Patient    Methods Explanation;Demonstration    Comprehension Verbalized understanding              PT Short Term Goals - 10/04/20 0948       PT SHORT TERM GOAL #1   Title Patient will be independent with initial HEP and self-management strategies to improve functional outcomes    Time 2    Period Weeks    Status On-going    Target Date 10/08/20  PT Long Term Goals - 10/04/20 0948       PT LONG TERM GOAL #1   Title Patient will be independent with advanced HEP and self-management strategies to improve functional outcomes    Time 4    Period Weeks    Status On-going      PT LONG TERM GOAL #2   Title Patient will be able to perform stand x 5 in < 12 seconds to demonstrate improvement in functional mobility and reduced risk for falls.    Time 4    Period Weeks    Status On-going      PT LONG TERM GOAL #3   Title Patient will report at least 75% overall improvement in subjective complaint to indicate improvement in ability to perform ADLs.    Time 4    Period Weeks    Status On-going      PT LONG TERM GOAL #4   Title Patient will be able to ambulate at least 310 feet during 2MWT with LRAD to demonstrate improved ability to perform functional mobility and associated tasks.    Time 4    Period Weeks    Status On-going      PT LONG TERM GOAL #5   Title Patient will improve BERG score by at least 5 points to indicate  improvement in balance and functional outcomes    Time 4    Period Weeks    Status On-going                   Plan - 10/04/20 3614     Clinical Impression Statement Added LE strengthening due to L hip pain/weakness. Pt able to perform STS reps with gait belt around thighs for added hip abduction. Pt with difficulty performing sidestepping with flat foot cone taps due to L hip weakness, demo good stepping strategy with LOB requiring no physical assist to recover. Pt with good weighted walking, maintains good posture and without trunk sway or LOB. Continue to progress as able.    Examination-Activity Limitations Transfers;Stairs;Locomotion Level    Examination-Participation Restrictions Cleaning;Yard Work;Community Activity;Laundry;Church    Stability/Clinical Decision Making Stable/Uncomplicated    Rehab Potential Good    PT Frequency 2x / week    PT Duration 4 weeks    PT Treatment/Interventions ADLs/Self Care Home Management;Aquatic Therapy;Biofeedback;Cryotherapy;Electrical Stimulation;Contrast Bath;Fluidtherapy;Parrafin;Therapeutic activities;Therapeutic exercise;Orthotic Fit/Training;Patient/family education;Functional mobility training;Neuromuscular re-education;Stair training;Iontophoresis 4mg /ml Dexamethasone;Moist Heat;Traction;Ultrasound;DME Instruction;Gait training;Balance training;Scar mobilization;Passive range of motion;Vestibular;Visual/perceptual remediation/compensation;Dry needling;Manual techniques;Manual lymph drainage;Energy conservation;Splinting;Taping;Compression bandaging;Vasopneumatic Device;Joint Manipulations;Spinal Manipulations    PT Next Visit Plan Progress functional strength and balance as tolerated. Focus on hip strength. Static>dynamic> gait.  Continue ressistance with sidestepping and begin dynamic surface    PT Home Exercise Plan Eval: sit to stand, tandem stance at counter, heel raise    Consulted and Agree with Plan of Care Patient              Patient will benefit from skilled therapeutic intervention in order to improve the following deficits and impairments:  Improper body mechanics, Abnormal gait, Decreased balance, Difficulty walking, Decreased activity tolerance, Decreased endurance, Decreased strength, Decreased mobility  Visit Diagnosis: Other abnormalities of gait and mobility     Problem List Patient Active Problem List   Diagnosis Date Noted   Encounter for screening colonoscopy 12/13/2019   Occipital neuralgia of left side 06/24/2019   Chronic intractable headache 06/24/2019   Uncontrolled type 2 diabetes mellitus with hyperglycemia ([place]) 01/19/2019   S/P total knee replacement, right 2007 01/12/2019  Special screening for malignant neoplasms, colon 12/22/2018   Type 2 diabetes mellitus with diabetic neuropathy, unspecified ([place]) 02/04/2018   Carpal tunnel syndrome 05/16/2015   Morbid obesity ([place]) 03/13/2015   Elevated alkaline phosphatase level 09/15/2013   HTN (hypertension) 09/14/2013   Type 2 diabetes mellitus with hemoglobin A1c goal of less than 7.5% ([place]) 10/04/2012   Lumbago with sciatica of left side 10/22/2010   Difficulty in walking(719.7) 10/16/2010   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 09/23/2010   Acquired trigger finger 08/15/2010   OA (osteoarthritis) of knee 08/15/2010   KNEE PAIN 05/26/2007   S/P hip replacement, left 05/26/2007   DEGENERATIVE JOINT DISEASE, LEFT HIP 12/03/2006     Talbot Grumbling PT, DPT 10/04/20, 9:50 AM    [facility] [place], [place], [address] Phone: [phone]   Fax:  [phone]  Name: [name] MRN: [id number] Date of Birth: [birth year]

## 2020-10-08 ENCOUNTER — Ambulatory Visit: Payer: Medicare HMO | Admitting: Adult Health

## 2020-10-08 ENCOUNTER — Encounter: Payer: Self-pay | Admitting: Adult Health

## 2020-10-08 ENCOUNTER — Other Ambulatory Visit (HOSPITAL_COMMUNITY)
Admission: RE | Admit: 2020-10-08 | Discharge: 2020-10-08 | Disposition: A | Discharge status: Home / Self Care | Payer: Medicare HMO | Source: Ambulatory Visit | Attending: Adult Health | Admitting: Adult Health

## 2020-10-08 ENCOUNTER — Other Ambulatory Visit: Payer: Self-pay

## 2020-10-08 VITALS — BP 138/77 | HR 93 | Ht 67.0 in | Wt 282.5 lb

## 2020-10-08 DIAGNOSIS — N898 Other specified noninflammatory disorders of vagina: Secondary | ICD-10-CM | POA: Diagnosis not present

## 2020-10-08 DIAGNOSIS — B373 Candidiasis of vulva and vagina: Secondary | ICD-10-CM | POA: Diagnosis not present

## 2020-10-08 DIAGNOSIS — B3731 Acute candidiasis of vulva and vagina: Secondary | ICD-10-CM | POA: Insufficient documentation

## 2020-10-08 MED ORDER — FLUCONAZOLE 100 MG PO TABS: ORAL_TABLET | ORAL | 0 refills | Status: DC

## 2020-10-08 NOTE — Progress Notes (Signed)
  Subjective:     Patient ID: [name], female   DOB: [birth year], 70 y.o.   MRN: [id number]  HPI [name] is a 70 year old black female, separated, sp hysterectomy, she is a new pt, in complaining of vaginal itching since November. She is diabetic but says sugars are good. [facility] has had [facility] PCP is Dr Wolfgang Phoenix.  Review of Systems Vaginal itching since November, it is inside and out No sexually active Reviewed past medical,surgical, social and family history. Reviewed medications and allergies.     Objective:   Physical Exam BP 138/77 (BP Location: Left Arm, Patient Position: Sitting, Cuff Size: Large)   Pulse 93   Ht 5\' 7"  (1.702 m)   Wt 282 lb 8 oz (128.1 kg)   BMI 44.25 kg/m     Skin warm and dry.Pelvic: external genitalia is red and irritated,vagina: white discharge without  odor,urethra has no lesions or masses noted, cervix and uterus are absent,adnexa: no masses or tenderness noted. Bladder is non tender and no masses felt. CV swab obtained.   AA is 0  Fall risk is low Depression screen [facility] 2/9 10/08/2020 09/03/2020 05/03/2020  Decreased Interest 0 0 0  Down, Depressed, Hopeless 0 0 0  PHQ - 2 Score 0 0 0  Altered sleeping 0 - -  Tired, decreased energy 0 - -  Change in appetite 0 - -  Feeling bad or failure about yourself  0 - -  Trouble concentrating 0 - -  Moving slowly or fidgety/restless 0 - -  Suicidal thoughts 0 - -  PHQ-9 Score 0 - -  Some recent data might be hidden    GAD 7 : Generalized Anxiety Score 10/08/2020  Nervous, Anxious, on Edge 0  Control/stop worrying 0  Worry too much - different things 0  Trouble relaxing 0  Restless 0  Easily annoyed or irritable 0  Afraid - awful might happen 0  Total GAD 7 Score 0      Upstream - 10/08/20 1017       Pregnancy Intention Screening   Does the patient want to become pregnant in the next year? N/A    Does the patient's partner want to become pregnant in the  next year? N/A    Would the patient like to discuss contraceptive options today? N/A      Contraception Wrap Up   Current Method Female Sterilization   hyst   End Method Female Sterilization   hyst   Contraception Counseling Provided No            Examination chaperoned by Levy Pupa LPN  Assessment:     1. Vaginal itching CV swab sent for yeast and BV  2. Yeast vaginitis   Painted with gentian violet and rx diflucan Meds ordered this encounter  Medications   fluconazole (DIFLUCAN) 100 MG tablet    Sig: Take 1 daily for 10 days    Dispense:  10 tablet    Refill:  0    Order Specific Question:   Supervising Provider    Answer:   Florian Buff [[id number]]    Plan:    Stay out of pool til Friday  Pat dry Follow up in 2 weeks for recheck

## 2020-10-09 ENCOUNTER — Ambulatory Visit (HOSPITAL_COMMUNITY): Payer: Medicare HMO

## 2020-10-09 ENCOUNTER — Telehealth: Payer: Self-pay | Admitting: Adult Health

## 2020-10-09 DIAGNOSIS — R2689 Other abnormalities of gait and mobility: Secondary | ICD-10-CM | POA: Diagnosis not present

## 2020-10-09 LAB — CERVICOVAGINAL ANCILLARY ONLY
Bacterial Vaginitis (gardnerella): POSITIVE — AB
Candida Glabrata: NEGATIVE
Candida Vaginitis: POSITIVE — AB
Comment: NEGATIVE
Comment: NEGATIVE
Comment: NEGATIVE

## 2020-10-09 MED ORDER — METRONIDAZOLE 500 MG PO TABS: 500.0000 mg | ORAL_TABLET | Freq: Two times a day (BID) | ORAL | 0 refills | Status: DC

## 2020-10-09 NOTE — Telephone Encounter (Signed)
Pt aware that vaginal swab +BV and yeast, already treated for yeast, will send in rx for flagyl. She is feeling better

## 2020-10-09 NOTE — Therapy (Signed)
[place], [place], [address] Phone: [phone]   Fax:  [phone]  Physical Therapy Treatment  Patient Details  Name: [name] MRN: [id number] Date of Birth: 25-Aug-1950 Referring Provider (PT): Sallee Lange MD   Encounter Date: 10/09/2020   PT End of Session - 10/09/20 0855     Visit Number 5    Number of Visits 8    Date for PT Re-Evaluation 10/22/20    Authorization Type [employer] Medicare    PT Start Time 0900    PT Stop Time 0945    PT Time Calculation (min) 45 min    Activity Tolerance Patient tolerated treatment well    Behavior During Therapy [facility] for tasks assessed/performed             Past Medical History:  Diagnosis Date   Arthritis    Essential hypertension    Finger amputation, 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 Oneida Alar: slightly tortuous colon. internal hemorrhoids. next colonoscopy in 10 years.    HERNIA REPAIR     x2   OOPHORECTOMY     REPLACEMENT TOTAL KNEE Right    TOTAL HIP ARTHROPLASTY Left 2008?   [facility], [place]   TOTAL KNEE ARTHROPLASTY  09/09/2010   Procedure: TOTAL KNEE ARTHROPLASTY;  Surgeon: Arther Abbott, MD;  Location: [facility];  Service: Orthopedics;  Laterality: Left;  With DePuy    There were no vitals filed for this visit.   Subjective Assessment - 10/09/20 0858     Subjective No issues to report. Pt denies any instances of falls or LOB    Currently in Pain? No/denies                [facility] PT Assessment - 10/09/20 0001       Assessment   Medical Diagnosis Balance difficulty                           [facility] Adult PT Treatment/Exercise -  10/09/20 0001       Transfers   Five time sit to stand comments  11 sec without use of hands      Ambulation/Gait   Ambulation/Gait Yes    Ambulation/Gait Assistance 7: Independent    Ambulation Distance (Feet) 400 Feet    Assistive device None    Gait Pattern Within Functional Limits    Ambulation Surface Level;Indoor    Gait Comments 2MWT      Knee/Hip Exercises: Aerobic   Tread Mill 1 mph x 2 min forward, 2 min backward flat ground      Knee/Hip Exercises: Standing   Heel Raises 3 sets;10 reps    Heel Raises Limitations toes parallel, toes in, toes out, 15 reps each    Hip Flexion Stengthening;Both;3 sets;10 reps    Hip Flexion Limitations lift-offs from 6" step for single limb stance    Other Standing Knee Exercises static standing on airex pad eyes closed 3x10 sec. Standing on airex pad throwing, catching, bouncing playgrond ball then tennis ball for coordination and ankle strategy    Other Standing Knee Exercises  sidestepping holding cable 4 plates 1x10 left/right                       PT Short Term Goals - 10/04/20 0948       PT SHORT TERM GOAL #1   Title Patient will be independent with initial HEP and self-management strategies to improve functional outcomes    Time 2    Period Weeks    Status On-going    Target Date 10/08/20               PT Long Term Goals - 10/09/20 0926       PT LONG TERM GOAL #1   Title Patient will be independent with advanced HEP and self-management strategies to improve functional outcomes    Time 4    Period Weeks    Status On-going      PT LONG TERM GOAL #2   Title Patient will be able to perform stand x 5 in < 12 seconds to demonstrate improvement in functional mobility and reduced risk for falls.    Baseline 11 sec test    Time 4    Period Weeks    Status Achieved      PT LONG TERM GOAL #3   Title Patient will report at least 75% overall improvement in subjective complaint to indicate improvement in  ability to perform ADLs.    Baseline 10% improvement    Time 4    Period Weeks    Status On-going      PT LONG TERM GOAL #4   Title Patient will be able to ambulate at least 310 feet during 2MWT with LRAD to demonstrate improved ability to perform functional mobility and associated tasks.    Baseline 400 ft without AD    Time 4    Period Weeks    Status Achieved      PT LONG TERM GOAL #5   Title Patient will improve BERG score by at least 5 points to indicate improvement in balance and functional outcomes    Time 4    Period Weeks    Status On-going                   Plan - 10/09/20 0940     Clinical Impression Statement Progressing with POC details and able to demo improved times/distance for 5xSTS and 2MWT.  Pt notes 10% improvement in symptoms and function and reports no LOB since starting PT. Continued services indicated to progress HEP for balance/LE strength as pt endorses habit of not going to her usual gym/pool activities during the winter.    Examination-Activity Limitations Transfers;Stairs;Locomotion Level    Examination-Participation Restrictions Cleaning;Yard Work;Community Activity;Laundry;Church    Stability/Clinical Decision Making Stable/Uncomplicated    Rehab Potential Good    PT Frequency 2x / week    PT Duration 4 weeks    PT Treatment/Interventions ADLs/Self Care Home Management;Aquatic Therapy;Biofeedback;Cryotherapy;Electrical Stimulation;Contrast Bath;Fluidtherapy;Parrafin;Therapeutic activities;Therapeutic exercise;Orthotic Fit/Training;Patient/family education;Functional mobility training;Neuromuscular re-education;Stair training;Iontophoresis 4mg /ml Dexamethasone;Moist Heat;Traction;Ultrasound;DME Instruction;Gait training;Balance training;Scar mobilization;Passive range of motion;Vestibular;Visual/perceptual remediation/compensation;Dry needling;Manual techniques;Manual lymph drainage;Energy conservation;Splinting;Taping;Compression  bandaging;Vasopneumatic Device;Joint Manipulations;Spinal Manipulations    PT Next Visit Plan Progress functional strength and balance as tolerated. Focus on hip strength. Static>dynamic> gait.  Continue ressistance with sidestepping and begin dynamic surface    PT Home Exercise Plan Eval: sit to stand, tandem stance at counter, heel raise    Consulted and Agree with Plan of Care Patient  Patient will benefit from skilled therapeutic intervention in order to improve the following deficits and impairments:  Improper body mechanics, Abnormal gait, Decreased balance, Difficulty walking, Decreased activity tolerance, Decreased endurance, Decreased strength, Decreased mobility  Visit Diagnosis: Other abnormalities of gait and mobility     Problem List Patient Active Problem List   Diagnosis Date Noted   Vaginal itching 10/08/2020   Yeast vaginitis 10/08/2020   Encounter for screening colonoscopy 12/13/2019   Occipital neuralgia of left side 06/24/2019   Chronic intractable headache 06/24/2019   Uncontrolled type 2 diabetes mellitus with hyperglycemia ([place]) 01/19/2019   S/P total knee replacement, right 2007 01/12/2019   Special screening for malignant neoplasms, colon 12/22/2018   Type 2 diabetes mellitus with diabetic neuropathy, unspecified ([place]) 02/04/2018   Carpal tunnel syndrome 05/16/2015   Morbid obesity ([place]) 03/13/2015   Elevated alkaline phosphatase level 09/15/2013   HTN (hypertension) 09/14/2013   Type 2 diabetes mellitus with hemoglobin A1c goal of less than 7.5% ([place]) 10/04/2012   Lumbago with sciatica of left side 10/22/2010   Difficulty in walking(719.7) 10/16/2010   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 09/23/2010   Acquired trigger finger 08/15/2010   OA (osteoarthritis) of knee 08/15/2010   KNEE PAIN 05/26/2007   S/P hip replacement, left 05/26/2007   DEGENERATIVE JOINT DISEASE, LEFT HIP 12/03/2006    Toniann Fail, PT 10/09/2020, 9:44  AM  [facility] [address] [place], [place], [address] Phone: [phone]   Fax:  [phone]  Name: [name] MRN: [id number] Date of Birth: May 15, 1950

## 2020-10-11 ENCOUNTER — Encounter (HOSPITAL_COMMUNITY): Payer: Medicare HMO

## 2020-10-11 ENCOUNTER — Encounter: Payer: Self-pay | Admitting: Nutrition

## 2020-10-11 ENCOUNTER — Other Ambulatory Visit: Payer: Self-pay

## 2020-10-11 ENCOUNTER — Encounter: Payer: Medicare HMO | Attending: Family Medicine | Admitting: Nutrition

## 2020-10-11 DIAGNOSIS — E119 Type 2 diabetes mellitus without complications: Secondary | ICD-10-CM | POA: Diagnosis not present

## 2020-10-11 NOTE — Progress Notes (Signed)
Medical Nutrition Therapy  Appointment Start time:  1572  Appointment End time:  37  Primary concerns today: DM Type 2  Referral diagnosis: E11.8 Preferred learning style: no preference.  Learning readiness: contemplating (not ready, contemplating, ready, change in progress)   NUTRITION ASSESSMENT  She notes she quit taking her medications and testing when she moved. Can't find her meter or testing supplies. A1C 15.2% 8/22. Willing to start testing and is taking her Metformin 500 mg BID. She is willing to go on insulin to get her blood sugars down. Current diet is insuffient to meet her needs with only eating 1-2 times per day-whenever she is hungry. She is in need of insulin to control her blood sugars. Labs reveal hyperlipidemia, and possible fatty liver with elevated Alk Phosphorus levels. Expect lipids to improve some once  blood sugars come down Anthropometrics  Wt Readings from Last 3 Encounters:  10/08/20 282 lb 8 oz (128.1 kg)  09/03/20 284 lb (128.8 kg)  05/23/20 282 lb (127.9 kg)   Ht Readings from Last 3 Encounters:  10/08/20 5' 7" (1.702 m)  09/03/20 5' 7" (1.702 m)  05/23/20 5' 7" (1.702 m)   There is no height or weight on file to calculate BMI. _0 @ Facility age limit for growth percentiles is 20 years. Facility age limit for growth percentiles is 20 years.    Clinical Medical Hx: DM Type 2, Obesity, HTN, Hyperlipidemia Medications: Metformin 500 mg BID Labs:  Lab Results  Component Value Date   HGBA1C 15.2 (H) 09/03/2020   CMP Latest Ref Rng & Units 09/03/2020 10/24/2019 11/18/2018  Glucose 65 - 99 mg/dL 361(H) 384(H) 359(H)  BUN 8 - 27 mg/dL _1 Creatinine 0.57 - 1.00 mg/dL 0.95 0.97 0.99  Sodium 134 - 144 mmol/L 135 133(L) 135  Potassium 3.5 - 5.2 mmol/L 4.6 4.4 4.0  Chloride 96 - 106 mmol/L 98 98 95(L)  CO2 20 - 29 mmol/L _2 Calcium 8.7 - 10.3 mg/dL 9.5 10.0 9.7  Total Protein 6.0 - 8.5 g/dL 7.8 7.9 7.8  Total Bilirubin 0.0 - 1.2  mg/dL 0.3 0.3 0.3  Alkaline Phos 44 - 121 IU/L 155(H) 182(H) 195(H)  AST 0 - 40 IU/L _3 ALT 0 - 32 IU/L _4 Lipid Panel     Component Value Date/Time   CHOL 341 (H) 09/03/2020 1016   TRIG 190 (H) 09/03/2020 1016   HDL 47 09/03/2020 1016   CHOLHDL 7.3 (H) 09/03/2020 1016   CHOLHDL 4.6 12/27/2013 0710   VLDL 22 12/27/2013 0710   LDLCALC 256 (H) 09/03/2020 1016   LABVLDL 38 09/03/2020 1016     Notable Signs/Symptoms: Tinggling in her hands. Tired of sticking fingers.  Lifestyle & Dietary Hx LIves by herself. Uses stove and airfryer. Occasionally goes out to eat to [employer] or [place] food.  Estimated daily fluid intake: 40 oz Supplements:  Sleep: 7 hours Stress / self-care: none Current average weekly physical activity: walks some in the house  24-Hr Dietary Recall First Meal:  8-9 am. Berniece Salines  4 eggs 2, Coffee and OJ 8 oz Snack:  Second Meal: skipped Snack:  Third Meal: Pork n beans and rice,  water Snack: sometimes of chips or cookies. Beverages: water   Estimated Energy Needs Calories: 1200 Carbohydrate: 135g Protein: 90g Fat: 33g   NUTRITION DIAGNOSIS  NB-1.1 Food and nutrition-related knowledge deficit As related to Diabetes Type 2.  As evidenced by A1C 15.2%.  NUTRITION INTERVENTION  Nutrition education (E-1) on the following topics:  Nutrition and Diabetes education provided on My Plate, CHO counting, meal planning, portion sizes, timing of meals, avoiding snacks between meals unless having a low blood sugar, target ranges for A1C and blood sugars, signs/symptoms and treatment of hyper/hypoglycemia, monitoring blood sugars, taking medications as prescribed, benefits of exercising 30 minutes per day and prevention of complications of DM. Blood Glucose Monitoring Instruction  Meter Provided: Accucheck Guide  Yes  Lot #:  Expiration Date:  Medications: Metformin 500 mg BID.     Intervention:   Explained rationale of testing BG to obtain  data as to how their diabetes is being managed. Provided Target Ranges for both pre and post meals Explained factors that effect BG including food (carbohydrate), stress, activity level and insulin availability in the body including diabetes medications Taught patient techniques for using BG monitor and lancing device Discussed need for Rx for strips and lancets  Explained rationale of recording BG both for patient and MD to assess patterns as needed.    Handouts Provided Include  My Plate Diabetes Instructions Know your numbers.   Learning Style & Readiness for Change Teaching method utilized: Visual & Auditory  Demonstrated degree of understanding via: Teach Back  Barriers to learning/adherence to lifestyle change: none  Goals Established by Pt Goals  Follow My Plate Eat meals on time Eat 2-3 carb choices per meals Drink water 100 oz per day Avoid snacks between meals Test blood sugars 4 tiimes per day Take Metformin 1 pill after breakfast and one at bedtime   MONITORING & EVALUATION Dietary intake, weekly physical activity, and blood sugars in 2 weeks.  Recommend to start long acting insulin and increase metformin to 1000 mg BID. Consider Ozempic or Monjero for needed weight loss. Next Steps  Patient is to work on meal planning and testing blood sugars..  

## 2020-10-11 NOTE — Patient Instructions (Signed)
Goals  Follow My Plate Eat meals on time Eat 2-3 carb choices per meals Drink water 100 oz per day Avoid snacks between meals Test blood sugars 4 tiimes per day Take Metformin 1 pill after breakfast and one at bedtime

## 2020-10-12 ENCOUNTER — Telehealth: Payer: Self-pay | Admitting: Family Medicine

## 2020-10-12 NOTE — Telephone Encounter (Signed)
Per 09/04/20 lab results I also recommend restarting metformin 500 mg 1 taken twice daily, #60  Diabetic educator called yesterday and I spoke with her and informed her of these results. Please advise. Thank you

## 2020-10-12 NOTE — Telephone Encounter (Signed)
Pt contacted and verbalized understanding. Lab order placed already. Strip and lancet script faxed to [facility]. Pt schedule for mid October to follow up on sugar readings.

## 2020-10-12 NOTE — Telephone Encounter (Signed)
Patient stopped by office states that she was told to take metformin 500 mg tab 2 in the morning and 2 at night--however the prescription bottle she has it states take 1 tablet by mouth twice a day with a meal. She would like to know which way she is supposed to be taken.  She also states that she seen the diabetic specialist yesterday and they gave her a new meter it is the accu chek guide me meter she needs testing strips and lantus called into [facility] in [place].  CB# [phone]

## 2020-10-12 NOTE — Telephone Encounter (Signed)
Nurses When I saw her she stated that she was not taking any of her medicine I told her that if her A1c came back elevated we would want her to restart the metformin at 500 mg 1 twice daily and repeat met 7 in approximately 1 month I would  recommend we send prescription for strips and testing supply for her new meter Also recommend that she tests on a once a day basis sometimes checked in the morning sometimes checked before supper She should write these numbers down She should do a follow-up visit with [place] within 3 weeks to 4 weeks to review these readings She may need to be on additional medications. If there are any questions problems issues please let me know

## 2020-10-15 ENCOUNTER — Encounter (HOSPITAL_COMMUNITY): Payer: Self-pay

## 2020-10-15 ENCOUNTER — Other Ambulatory Visit: Payer: Self-pay

## 2020-10-15 ENCOUNTER — Ambulatory Visit (HOSPITAL_COMMUNITY): Payer: Medicare HMO | Attending: Family Medicine

## 2020-10-15 DIAGNOSIS — R2689 Other abnormalities of gait and mobility: Secondary | ICD-10-CM | POA: Insufficient documentation

## 2020-10-15 NOTE — Patient Instructions (Signed)
Hip Extension (Standing)    Stand with support. Squeeze pelvic floor and hold. Move right leg backward with straight knee. Hold for 1-3_ seconds.  Repeat 10-15 times. Do _1__ times a day. Repeat with other leg. When strong enough add ___ lb weight.   Copyright  VHI. All rights reserved.   HIP: Abduction - Standing    Squeeze glutes. Raise leg out and slightly back. _10-15__ reps per set, __1_ sets per day. Hold onto a support.  Copyright  VHI. All rights reserved.

## 2020-10-15 NOTE — Therapy (Signed)
[place], [place], [address] Phone: [phone]   Fax:  [phone]  Physical Therapy Treatment  Patient Details  Name: [name] MRN: [id number] Date of Birth: Jul 19, 1950 Referring Provider (PT): Sallee Lange MD   Encounter Date: 10/15/2020   PT End of Session - 10/15/20 0852     Visit Number 6    Number of Visits 8    Date for PT Re-Evaluation 10/22/20    Authorization Type [employer] Medicare    PT Start Time 0853    PT Stop Time 0933    PT Time Calculation (min) 40 min    Activity Tolerance Patient tolerated treatment well    Behavior During Therapy [facility] for tasks assessed/performed             Past Medical History:  Diagnosis Date   Arthritis    Essential hypertension    Finger amputation, traumatic Age 22   Axe   Hyperlipidemia    Obesity    PONV (postoperative nausea and vomiting)    Type 2 diabetes mellitus ([place])    Type 2 diabetes mellitus with diabetic neuropathy, unspecified ([place]) 02/04/2018    Past Surgical History:  Procedure Laterality Date   ABDOMINAL HYSTERECTOMY     COLONOSCOPY  12/2008   Dr. Oneida Alar: slightly tortuous colon. internal hemorrhoids. next colonoscopy in 10 years.    HERNIA REPAIR     x2   OOPHORECTOMY     REPLACEMENT TOTAL KNEE Right    TOTAL HIP ARTHROPLASTY Left 2008?   [facility], [place]   TOTAL KNEE ARTHROPLASTY  09/09/2010   Procedure: TOTAL KNEE ARTHROPLASTY;  Surgeon: Arther Abbott, MD;  Location: [facility];  Service: Orthopedics;  Laterality: Left;  With DePuy    There were no vitals filed for this visit.   Subjective Assessment - 10/15/20 0851     Subjective Patient reports doing HEP regularly. Feels 80% better.               [place] Adult PT Treatment/Exercise - 10/15/20 0001       Knee/Hip Exercises: Standing   Heel Raises 3 sets;15 reps    Heel Raises Limitations toes parallel, toes in, toes out, 15 reps each    Hip Flexion  Stengthening;Both;3 sets;10 reps    Hip Flexion Limitations lift-offs from 6" step for single limb stance    Hip Abduction Stengthening;Both;1 set;10 reps    Hip Extension Stengthening;Both;1 set;10 reps    Other Standing Knee Exercises static standing on airex pad eyes closed 3x10 sec. tandem on foam 30 sec x2 each; throw catching on tramp 500 g and 2000 g ball in NBOS and tandem x10 each, ball for coordination and ankle strategy    Other Standing Knee Exercises sidestepping holding cable 4 plates 1x15 left/right                     PT Education - 10/15/20 0928     Education Details Exercise technique; add pillow to tandem stance balance at home. Advanced HEP.    Person(s) Educated Patient    Methods Explanation;Handout    Comprehension Verbalized understanding              PT Short Term Goals - 10/15/20 0852       PT SHORT TERM GOAL #1   Title Patient will be independent with initial HEP and self-management strategies to improve functional outcomes    Time 2    Period  Weeks    Status Achieved    Target Date 10/08/20               PT Long Term Goals - 10/15/20 4854       PT LONG TERM GOAL #1   Title Patient will be independent with advanced HEP and self-management strategies to improve functional outcomes    Time 4    Period Weeks    Status On-going      PT LONG TERM GOAL #2   Title Patient will be able to perform stand x 5 in < 12 seconds to demonstrate improvement in functional mobility and reduced risk for falls.    Baseline 11 sec test    Time 4    Period Weeks    Status Achieved      PT LONG TERM GOAL #3   Title Patient will report at least 75% overall improvement in subjective complaint to indicate improvement in ability to perform ADLs.    Baseline 80% improvement    Time 4    Period Weeks    Status On-going      PT LONG TERM GOAL #4   Title Patient will be able to ambulate at least 310 feet during 2MWT with LRAD to demonstrate  improved ability to perform functional mobility and associated tasks.    Baseline 400 ft without AD    Time 4    Period Weeks    Status Achieved      PT LONG TERM GOAL #5   Title Patient will improve BERG score by at least 5 points to indicate improvement in balance and functional outcomes    Time 4    Period Weeks    Status On-going                   Plan - 10/15/20 6270     Clinical Impression Statement Session focused on lower extremity strengthening and dynamic balance challenges.  Advanced HEP by adding compliant surface to tandem stance balance and standing hip extension and abduction. Increased reps on heel raises and side stepping. Performed dynamic balance using weight ball and throwing against trampoline and catching ball. This was performed in narrow base of support and tandem stance balance.  Patient would continue to benefit from skilled physical therapy to reduce impairment, improve functional ability and ensure comprehensive HEP for continuation post physical therapy.    Examination-Activity Limitations Transfers;Stairs;Locomotion Level    Examination-Participation Restrictions Cleaning;Yard Work;Community Activity;Laundry;Church    Stability/Clinical Decision Making Stable/Uncomplicated    Rehab Potential Good    PT Frequency 2x / week    PT Duration 4 weeks    PT Treatment/Interventions ADLs/Self Care Home Management;Aquatic Therapy;Biofeedback;Cryotherapy;Electrical Stimulation;Contrast Bath;Fluidtherapy;Parrafin;Therapeutic activities;Therapeutic exercise;Orthotic Fit/Training;Patient/family education;Functional mobility training;Neuromuscular re-education;Stair training;Iontophoresis 4mg /ml Dexamethasone;Moist Heat;Traction;Ultrasound;DME Instruction;Gait training;Balance training;Scar mobilization;Passive range of motion;Vestibular;Visual/perceptual remediation/compensation;Dry needling;Manual techniques;Manual lymph drainage;Energy  conservation;Splinting;Taping;Compression bandaging;Vasopneumatic Device;Joint Manipulations;Spinal Manipulations    PT Next Visit Plan Progress functional strength and balance as tolerated. Focus on hip strength. Static>dynamic> gait.  Continue ressistance with sidestepping and begin dynamic surface. Ensure solid HEP in prep for discharge from PT.    PT Home Exercise Plan Eval: sit to stand, tandem stance at counter, heel raise; 10/3 - add pillow to tandem stance balance; hip ext/abd    Consulted and Agree with Plan of Care Patient             Patient will benefit from skilled therapeutic intervention in order to improve the following deficits and impairments:  Improper  body mechanics, Abnormal gait, Decreased balance, Difficulty walking, Decreased activity tolerance, Decreased endurance, Decreased strength, Decreased mobility  Visit Diagnosis: Other abnormalities of gait and mobility     Problem List Patient Active Problem List   Diagnosis Date Noted   Vaginal itching 10/08/2020   Yeast vaginitis 10/08/2020   Encounter for screening colonoscopy 12/13/2019   Occipital neuralgia of left side 06/24/2019   Chronic intractable headache 06/24/2019   Uncontrolled type 2 diabetes mellitus with hyperglycemia ([place]) 01/19/2019   S/P total knee replacement, right 2007 01/12/2019   Special screening for malignant neoplasms, colon 12/22/2018   Type 2 diabetes mellitus with diabetic neuropathy, unspecified ([place]) 02/04/2018   Carpal tunnel syndrome 05/16/2015   Morbid obesity ([place]) 03/13/2015   Elevated alkaline phosphatase level 09/15/2013   HTN (hypertension) 09/14/2013   Type 2 diabetes mellitus with hemoglobin A1c goal of less than 7.5% ([place]) 10/04/2012   Lumbago with sciatica of left side 10/22/2010   Difficulty in walking(719.7) 10/16/2010   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 09/23/2010   Acquired trigger finger 08/15/2010   OA (osteoarthritis) of knee 08/15/2010   KNEE PAIN  05/26/2007   S/P hip replacement, left 05/26/2007   DEGENERATIVE JOINT DISEASE, LEFT HIP 12/03/2006   Floria Raveling. Hartnett-Rands, MS, PT [place] [phone]  Jeannie Done, PT 10/15/2020, 9:44 AM  [facility] [address] [place], [place], [address] Phone: [phone]   Fax:  [phone]  Name: [name] MRN: [id number] Date of Birth: 1950-04-04

## 2020-10-16 ENCOUNTER — Ambulatory Visit: Payer: Medicare HMO | Admitting: Nutrition

## 2020-10-17 DIAGNOSIS — H25813 Combined forms of age-related cataract, bilateral: Secondary | ICD-10-CM | POA: Diagnosis not present

## 2020-10-17 LAB — HM DIABETES EYE EXAM

## 2020-10-22 ENCOUNTER — Other Ambulatory Visit: Payer: Self-pay | Admitting: *Deleted

## 2020-10-22 ENCOUNTER — Other Ambulatory Visit: Payer: Self-pay

## 2020-10-22 ENCOUNTER — Ambulatory Visit (HOSPITAL_COMMUNITY): Payer: Medicare HMO | Admitting: Physical Therapy

## 2020-10-22 ENCOUNTER — Encounter (HOSPITAL_COMMUNITY): Payer: Self-pay | Admitting: Physical Therapy

## 2020-10-22 DIAGNOSIS — R2689 Other abnormalities of gait and mobility: Secondary | ICD-10-CM | POA: Diagnosis not present

## 2020-10-22 MED ORDER — METFORMIN HCL 500 MG PO TABS: 1000.0000 mg | ORAL_TABLET | Freq: Two times a day (BID) | ORAL | 0 refills | Status: DC

## 2020-10-22 NOTE — Therapy (Signed)
[facility] [address] [place], [place], [address] Phone: [phone]   Fax:  [phone]  Physical Therapy Treatment  Patient Details  Name: [name] MRN: [id number] Date of Birth: [birth year] Referring Provider (PT): Sallee Lange MD  PHYSICAL THERAPY DISCHARGE SUMMARY  Visits from Start of Care: 7  Current functional level related to goals / functional outcomes: See below    Remaining deficits: See below    Education / Equipment: See assessment    Patient agrees to discharge. Patient goals were met. Patient is being discharged due to meeting the stated rehab goals.  Encounter Date: 10/22/2020   PT End of Session - 10/22/20 0907     Visit Number 7    Number of Visits 8    Date for PT Re-Evaluation 10/22/20    Authorization Type [employer]    PT Start Time 0903    PT Stop Time 0931    PT Time Calculation (min) 28 min    Activity Tolerance Patient tolerated treatment well    Behavior During Therapy WFL for tasks assessed/performed             Past Medical History:  Diagnosis Date   Arthritis    Essential hypertension    Finger amputation, traumatic Age 70   [place]   Hyperlipidemia    Obesity    PONV (postoperative nausea and vomiting)    Type 2 diabetes mellitus ([place])    Type 2 diabetes mellitus with diabetic neuropathy, unspecified ([place]) 02/04/2018    Past Surgical History:  Procedure Laterality Date   ABDOMINAL HYSTERECTOMY     COLONOSCOPY  12/2008   Dr. Oneida Alar: slightly tortuous colon. internal hemorrhoids. next colonoscopy in 10 years.    HERNIA REPAIR     x2   OOPHORECTOMY     REPLACEMENT TOTAL KNEE Right    TOTAL HIP ARTHROPLASTY Left 2008?   [facility], [place]   TOTAL KNEE ARTHROPLASTY  09/09/2010   Procedure: TOTAL KNEE ARTHROPLASTY;  Surgeon: Arther Abbott, MD;  Location: [facility];  Service: Orthopedics;  Laterality: Left;  With [place]    There were no vitals filed for this visit.    Subjective Assessment - 10/22/20 0907     Subjective Patient says she feels "more than fine".  Patient reports 100% improvement since starting therapy.    Limitations Standing;Walking;House hold activities    Patient Stated Goals "I dont want to stagger"    Currently in Pain? No/denies                [facility] PT Assessment - 10/22/20 0001       Assessment   Medical Diagnosis Balance difficulty    Referring Provider (PT) Sallee Lange MD    Prior Therapy Yes      Precautions   Precautions Fall      Restrictions   Weight Bearing Restrictions No      Balance Screen   Has the patient fallen in the past 6 months No      [place] residence      Prior Function   Level of Independence Independent with basic ADLs      Transfers   Five time sit to stand comments  9.4 sec with no UE      Ambulation/Gait   Ambulation/Gait Yes    Ambulation/Gait Assistance 7: Independent    Ambulation Distance (Feet) 380 Feet    Assistive device None    Gait  Pattern Within Functional Limits    Ambulation Surface Level;Indoor    Gait Comments 2MWT   fatigued     Berg Balance Test   Sit to Stand Able to stand without using hands and stabilize independently    Standing Unsupported Able to stand safely 2 minutes    Sitting with Back Unsupported but Feet Supported on Floor or Stool Able to sit safely and securely 2 minutes    Stand to Sit Sits safely with minimal use of hands    Transfers Able to transfer safely, minor use of hands    Standing Unsupported with Eyes Closed Able to stand 10 seconds safely    Standing Unsupported with Feet Together Able to place feet together independently and stand 1 minute safely    From Standing, Reach Forward with Outstretched Arm Can reach confidently >25 cm (10")    From Standing Position, Pick up Object from Floor Able to pick up shoe safely and easily    From Standing Position, Turn to Look Behind Over each Shoulder Looks  behind from both sides and weight shifts well    Turn 360 Degrees Able to turn 360 degrees safely in 4 seconds or less    Standing Unsupported, Alternately Place Feet on Step/Stool Able to stand independently and safely and complete 8 steps in 20 seconds    Standing Unsupported, One Foot in Front Able to place foot tandem independently and hold 30 seconds    Standing on One Leg Able to lift leg independently and hold equal to or more than 3 seconds    Total Score 54                                      PT Short Term Goals - 10/15/20 0852       PT SHORT TERM GOAL #1   Title Patient will be independent with initial HEP and self-management strategies to improve functional outcomes    Time 2    Period Weeks    Status Achieved    Target Date 10/08/20               PT Long Term Goals - 10/22/20 0924       PT LONG TERM GOAL #1   Title Patient will be independent with advanced HEP and self-management strategies to improve functional outcomes    Baseline Reviewed and answered all patient questions    Time 4    Period Weeks    Status Achieved      PT LONG TERM GOAL #2   Title Patient will be able to perform stand x 5 in < 12 seconds to demonstrate improvement in functional mobility and reduced risk for falls.    Baseline 9.6 sec    Time 4    Period Weeks    Status Achieved      PT LONG TERM GOAL #3   Title Patient will report at least 75% overall improvement in subjective complaint to indicate improvement in ability to perform ADLs.    Baseline 100% improvement    Time 4    Period Weeks    Status Achieved      PT LONG TERM GOAL #4   Title Patient will be able to ambulate at least 310 feet during 2MWT with LRAD to demonstrate improved ability to perform functional mobility and associated tasks.    Baseline 400 ft without AD  Time 4    Period Weeks    Status Achieved      PT LONG TERM GOAL #5   Title Patient will improve BERG score by at  least 5 points to indicate improvement in balance and functional outcomes    Baseline current 54    Time 4    Period Weeks    Status Achieved                   Plan - 10/22/20 0934     Clinical Impression Statement Patient demos very good progress toward therapy goals and has currently met all goals. Patient showing significant improvement in static balance via BERG testing and gait speed/ mechanics. Patient reports 100% subjective improvement and currently reporting no deficits. Patient will be DC today with all goals met. Reviewed HEP and answered all patient questions. Patient encouraged to follow up with therapy services with any further questions or concerns.    Examination-Activity Limitations Transfers;Stairs;Locomotion Level    Examination-Participation Restrictions Cleaning;Yard Work;Community Activity;Laundry;Church    Stability/Clinical Decision Making Stable/Uncomplicated    Rehab Potential Good    PT Treatment/Interventions ADLs/Self Care Home Management;Aquatic Therapy;Biofeedback;Cryotherapy;Electrical Stimulation;Contrast Bath;Fluidtherapy;Parrafin;Therapeutic activities;Therapeutic exercise;Orthotic Fit/Training;Patient/family education;Functional mobility training;Neuromuscular re-education;Stair training;Iontophoresis 33m/ml Dexamethasone;Moist Heat;Traction;Ultrasound;DME Instruction;Gait training;Balance training;Scar mobilization;Passive range of motion;Vestibular;Visual/perceptual remediation/compensation;Dry needling;Manual techniques;Manual lymph drainage;Energy conservation;Splinting;Taping;Compression bandaging;Vasopneumatic Device;Joint Manipulations;Spinal Manipulations    PT Next Visit Plan DC to HEP    PT Home Exercise Plan Eval: sit to stand, tandem stance at counter, heel raise; 10/3 - add pillow to tandem stance balance; hip ext/abd    Consulted and Agree with Plan of Care Patient             Patient will benefit from skilled therapeutic  intervention in order to improve the following deficits and impairments:  Improper body mechanics, Abnormal gait, Decreased balance, Difficulty walking, Decreased activity tolerance, Decreased endurance, Decreased strength, Decreased mobility  Visit Diagnosis: Other abnormalities of gait and mobility     Problem List Patient Active Problem List   Diagnosis Date Noted   Vaginal itching 10/08/2020   Yeast vaginitis 10/08/2020   Encounter for screening colonoscopy 12/13/2019   Occipital neuralgia of left side 06/24/2019   Chronic intractable headache 06/24/2019   Uncontrolled type 2 diabetes mellitus with hyperglycemia ([place] 01/19/2019   S/P total knee replacement, right 2007 01/12/2019   Special screening for malignant neoplasms, colon 12/22/2018   Type 2 diabetes mellitus with diabetic neuropathy, unspecified ([place] 02/04/2018   Carpal tunnel syndrome 05/16/2015   [place]bid [place] 03/13/2015   Elevated alkaline phosphatase level 09/15/2013   HTN (hypertension) 09/14/2013   Type 2 diabetes mellitus with hemoglobin A1c goal of less than 7.5% ([facility] and E 10/04/2012   Lumbago with sciatica of left side 10/22/2010   Difficulty in walking(719.7) 10/16/2010   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 09/23/2010   Acquired trigger finger 08/15/2010   OA (osteoarthritis) of knee 08/15/2010   KNEE PAIN 05/26/2007   S/P hip replacement, left 05/26/2007   DEGENERATIVE JOINT DISEASE, LEFT HIP 12/03/2006   9:38 AM, 10/22/20 CJosue HectorPT DPT  [facility] [phone] [address]Phone: [phone]  Fax:  [phone] Name: Jocelyn MSAAMIYA JEPPSENMRN: [id number] of Birth: [birth year]

## 2020-10-23 ENCOUNTER — Ambulatory Visit: Payer: Medicare HMO | Admitting: Nutrition

## 2020-10-24 ENCOUNTER — Encounter (HOSPITAL_COMMUNITY): Payer: Medicare HMO

## 2020-10-25 ENCOUNTER — Ambulatory Visit: Payer: Medicare HMO | Admitting: Adult Health

## 2020-10-29 ENCOUNTER — Encounter: Payer: Medicare HMO | Attending: Family Medicine | Admitting: Nutrition

## 2020-10-29 ENCOUNTER — Other Ambulatory Visit: Payer: Self-pay

## 2020-10-29 DIAGNOSIS — E782 Mixed hyperlipidemia: Secondary | ICD-10-CM | POA: Insufficient documentation

## 2020-10-29 DIAGNOSIS — E1165 Type 2 diabetes mellitus with hyperglycemia: Secondary | ICD-10-CM | POA: Insufficient documentation

## 2020-10-29 DIAGNOSIS — I10 Essential (primary) hypertension: Secondary | ICD-10-CM | POA: Insufficient documentation

## 2020-10-29 NOTE — Patient Instructions (Addendum)
Goals Take 2 pills (500 mg each) of Metformin after breakfast and 2-(500 mg pill) after supper/before bed. Walk around the house 1-2 times per day for exercise Limit starch vegetables to 1 serving per day. Increase lower carb vegetables with lunch and dinner Keep drinking water-100 oz per day. Get A1C down to 7%.

## 2020-10-29 NOTE — Progress Notes (Signed)
Medical Nutrition Therapy  Appointment Start time:  [phone]  Appointment End time:  1660  Primary concerns today: DM Type 2  Referral diagnosis: E11.8 Preferred learning style: no preference.  Learning readiness: contemplating (not ready, contemplating, ready, change in progress)   NUTRITION ASSESSMENT  Finally got her meter and testing supplies. Having trouble getting blood out of fingers. BS was 334 at 10 am and 241 mg/dl before breakfast Feels better and has cut out chips, sodas and sweets. Urine is more  clear. Bowels are WNL. Has more energy and not as tired. BS are improving slowly.  Anthropometrics  Wt Readings from Last 3 Encounters:  10/11/20 288 lb (130.6 kg)  10/08/20 282 lb 8 oz (128.1 kg)  09/03/20 284 lb (128.8 kg)   Ht Readings from Last 3 Encounters:  10/11/20 _0  (1.702 m)  10/08/20 _1  (1.702 m)  09/03/20 _2  (1.702 m)   There is no height or weight on file to calculate BMI. _3 @ Facility age limit for growth percentiles is 20 years. Facility age limit for growth percentiles is 20 years.    Clinical Medical Hx: DM Type 2, Obesity, HTN, Hyperlipidemia Medications: Metformin 500 mg BID Labs:  Lab Results  Component Value Date   HGBA1C 15.2 (H) 09/03/2020   CMP Latest Ref Rng & Units 09/03/2020 10/24/2019 11/18/2018  Glucose 65 - 99 mg/dL 361(H) 384(H) 359(H)  BUN 8 - 27 mg/dL _4 Creatinine 0.57 - 1.00 mg/dL 0.95 0.97 0.99  Sodium 134 - 144 mmol/L 135 133(L) 135  Potassium 3.5 - 5.2 mmol/L 4.6 4.4 4.0  Chloride 96 - 106 mmol/L 98 98 95(L)  CO2 20 - 29 mmol/L _5 Calcium 8.7 - 10.3 mg/dL 9.5 10.0 9.7  Total Protein 6.0 - 8.5 g/dL 7.8 7.9 7.8  Total Bilirubin 0.0 - 1.2 mg/dL 0.3 0.3 0.3  Alkaline Phos 44 - 121 IU/L 155(H) 182(H) 195(H)  AST 0 - 40 IU/L _6 ALT 0 - 32 IU/L _7 Lipid Panel     Component Value Date/Time   CHOL 341 (H) 09/03/2020 1016   TRIG 190 (H) 09/03/2020 1016   HDL 47 09/03/2020 1016   CHOLHDL  7.3 (H) 09/03/2020 1016   CHOLHDL 4.6 12/27/2013 0710   VLDL 22 12/27/2013 0710   LDLCALC 256 (H) 09/03/2020 1016   LABVLDL 38 09/03/2020 1016     Notable Signs/Symptoms: Tinggling in her hands. Tired of sticking fingers.  Lifestyle & Dietary Hx LIves by herself. Uses stove and airfryer. Occasionally goes out to eat to [employer] or [place] food.  Estimated daily fluid intake: 40 oz Supplements:  Sleep: 7 hours Stress / self-care: none Current average weekly physical activity: walks some in the house  24-Hr Dietary Recall First Meal:  8-9 am. Boiled and toast and whole banana, Snack:  Second Meal: [place] Sandwich, with slice tomatoes, water, orange Snack:  Third Meal: Chicken, greens, potato salad and mac/cheese and cornbread, water. Snack: sometimes of chips or cookies. Beverages: water   Estimated Energy Needs Calories: 1200 Carbohydrate: 135g Protein: 90g Fat: 33g   NUTRITION DIAGNOSIS  NB-1.1 Food and nutrition-related knowledge deficit As related to Diabetes Type 2.  As evidenced by A1C 15.2%.   NUTRITION INTERVENTION  Nutrition education (E-1) on the following topics:  Nutrition and Diabetes education provided on My Plate, CHO counting, meal planning, portion sizes, timing of meals, avoiding snacks between meals unless having a low blood sugar,  target ranges for A1C and blood sugars, signs/symptoms and treatment of hyper/hypoglycemia, monitoring blood sugars, taking medications as prescribed, benefits of exercising 30 minutes per day and prevention of complications of DM. Blood Glucose Monitoring Instruction  Meter Provided: Accucheck Guide  Yes  Lot #:  Expiration Date:  Medications: Metformin 500 mg BID.     Intervention:   Explained rationale of testing BG to obtain data as to how their diabetes is being managed. Provided Target Ranges for both pre and post meals Explained factors that effect BG including food (carbohydrate), stress, activity level and  insulin availability in the body including diabetes medications Taught patient techniques for using BG monitor and lancing device Discussed need for Rx for strips and lancets  Explained rationale of recording BG both for patient and MD to assess patterns as needed.    Handouts Provided Include  My Plate Diabetes Instructions Know your numbers.   Learning Style & Readiness for Change Teaching method utilized: Visual & Auditory  Demonstrated degree of understanding via: Teach Back  Barriers to learning/adherence to lifestyle change: none  Goals Established by Pt Goals Take 2 pills (500 mg each) of Metformin after breakfast and 2-(500 mg pill) after supper/before bed. Walk around the house 1-2 times per day for exercise Limit starch vegetables to 1 serving per day. Increase lower carb vegetables with lunch and dinner Keep drinking water-100 oz per day. Get A1C down to 7%.  MONITORING & EVALUATION Dietary intake, weekly physical activity, and blood sugars in 2 weeks.  Recommend to start long acting insulin and increase metformin to 1000 mg BID. Consider Ozempic or Monjero for needed weight loss. Next Steps  Patient is to work on meal planning and testing blood sugars.Marland Kitchen

## 2020-10-30 ENCOUNTER — Encounter: Payer: Self-pay | Admitting: Family Medicine

## 2020-10-31 ENCOUNTER — Other Ambulatory Visit: Payer: Self-pay

## 2020-10-31 ENCOUNTER — Ambulatory Visit (INDEPENDENT_AMBULATORY_CARE_PROVIDER_SITE_OTHER): Payer: Medicare HMO | Admitting: Family Medicine

## 2020-10-31 VITALS — BP 108/67 | HR 80 | Temp 96.3°F | Ht 67.0 in | Wt 285.0 lb

## 2020-10-31 DIAGNOSIS — Z23 Encounter for immunization: Secondary | ICD-10-CM

## 2020-10-31 DIAGNOSIS — E1165 Type 2 diabetes mellitus with hyperglycemia: Secondary | ICD-10-CM | POA: Diagnosis not present

## 2020-10-31 NOTE — Progress Notes (Signed)
   Subjective:    Patient ID: [name], female    DOB: 10-16-1950, 70 y.o.   MRN: [id number]  Diabetes She presents for her follow-up diabetic visit. She has type 2 diabetes mellitus. Current diabetic treatments: metformin.   Patient presents for follow-up regarding diabetes.  She has seen the nutritionist.  She does not want to see the endocrinologist.  Her A1c was very high she was restarted on metformin.  Her current numbers still range around 200.  Strong probability she will need to be on long-acting insulin.  She denies chest pain shortness of breath swelling in the legs.  Review of Systems     Objective:   Physical Exam Lungs clear heart regular pulse normal BP good       Assessment & Plan:  Discussion was held today regarding her diabetes. We reviewed over GLP-1 She is tolerating the metformin well We reviewed over the potential side effects of GLP-1 We also reviewed over some of the examples including Ozempic and Mounjero I printed her information on GLP-1 benefits and risk as well as how to give. She is interested with meeting with the clinical pharmacist to initiate these.  We will have clinical pharmacist work with her insurance company to see which medication is covered There is still a possibility she will need to be on long-acting insulin if she does not get the desired effect She will repeat your A1c next month and a follow-up office visit at that time as well  Flu vaccine today.

## 2020-11-05 ENCOUNTER — Telehealth: Payer: Self-pay | Admitting: *Deleted

## 2020-11-05 NOTE — Progress Notes (Signed)
Referral placed in encounter. Please let me know if you need anything else. Thank you.

## 2020-11-05 NOTE — Addendum Note (Signed)
Addended by: Vicente Males on: 11/05/2020 09:09 AM   Modules accepted: Orders

## 2020-11-05 NOTE — Chronic Care Management (AMB) (Signed)
  Chronic Care Management   Note  11/05/2020 Name: [name] MRN: [id number] DOB: [birth year]  [name] is a 70 y.o. year old female who is a primary care patient of Luking, Elayne Snare, MD. I reached out to [place] by phone today in response to a referral sent by [name]'s PCP.  [name] was given information about Chronic Care Management services today including:  CCM service includes personalized support from designated clinical staff supervised by her physician, including individualized plan of care and coordination with other care providers 24/7 contact phone numbers for assistance for urgent and routine care needs. Service will only be billed when office clinical staff spend 20 minutes or more in a month to coordinate care. Only one practitioner may furnish and bill the service in a calendar month. The patient may stop CCM services at any time (effective at the end of the month) by phone call to the office staff. The patient is responsible for co-pay (up to 20% after annual deductible is met) if co-pay is required by the individual health plan.   Patient agreed to services and verbal consent obtained.   Follow up plan: Telephone appointment with care management team member scheduled for: 11/12/20  [place] [facility]  Direct Dial: [phone]

## 2020-11-12 ENCOUNTER — Ambulatory Visit (INDEPENDENT_AMBULATORY_CARE_PROVIDER_SITE_OTHER): Payer: Medicare HMO | Admitting: Pharmacist

## 2020-11-12 ENCOUNTER — Other Ambulatory Visit: Payer: Self-pay

## 2020-11-12 DIAGNOSIS — E785 Hyperlipidemia, unspecified: Secondary | ICD-10-CM | POA: Diagnosis not present

## 2020-11-12 DIAGNOSIS — E1165 Type 2 diabetes mellitus with hyperglycemia: Secondary | ICD-10-CM | POA: Diagnosis not present

## 2020-11-12 DIAGNOSIS — E1169 Type 2 diabetes mellitus with other specified complication: Secondary | ICD-10-CM

## 2020-11-12 DIAGNOSIS — I10 Essential (primary) hypertension: Secondary | ICD-10-CM

## 2020-11-12 DIAGNOSIS — E119 Type 2 diabetes mellitus without complications: Secondary | ICD-10-CM | POA: Diagnosis not present

## 2020-11-12 MED ORDER — TIRZEPATIDE 2.5 MG/0.5ML ~~LOC~~ SOAJ: 2.5000 mg | SUBCUTANEOUS | 0 refills | Status: DC

## 2020-11-12 MED ORDER — GLUCOSE BLOOD VI STRP
ORAL_STRIP | 12 refills | Status: DC
Start: 2020-11-12 — End: 2021-12-04

## 2020-11-12 MED ORDER — MOUNJARO 5 MG/0.5ML ~~LOC~~ SOAJ: 5.0000 mg | SUBCUTANEOUS | 2 refills | Status: DC

## 2020-11-12 MED ORDER — ROSUVASTATIN CALCIUM 40 MG PO TABS: 40.0000 mg | ORAL_TABLET | Freq: Every day | ORAL | 11 refills | Status: DC

## 2020-11-12 NOTE — Patient Instructions (Signed)
[name],  It was great to talk to you today!  Please call me with any questions or concerns.   Visit Information   PATIENT GOALS:   Goals Addressed             This Visit's Progress    Medication Management       Patient Goals/Self-Care Activities Patient will:  Take medications as prescribed Check blood sugar three times a day at the following times: fasting (at least 8 hours since last food consumption), 5-15 minutes before lunch, bedtime, and whenever patient experiences symptoms of hypo/hyperglycemia, document, and provide at future appointments Collaborate with provider on medication access solutions Engage in dietary modifications by less frequent dining out, decreased fat intake, and fewer sweetened foods & beverages          Consent to CCM Services: [name] was given information about Chronic Care Management services including:  CCM service includes personalized support from designated clinical staff supervised by her physician, including individualized plan of care and coordination with other care providers 24/7 contact phone numbers for assistance for urgent and routine care needs. Service will only be billed when office clinical staff spend 20 minutes or more in a month to coordinate care. Only one practitioner may furnish and bill the service in a calendar month. The patient may stop CCM services at any time (effective at the end of the month) by phone call to the office staff. The patient will be responsible for cost sharing (co-pay) of up to 20% of the service fee (after annual deductible is met).  Patient agreed to services and verbal consent obtained.   The patient verbalized understanding of instructions, educational materials, and care plan provided today and declined offer to receive copy of patient instructions, educational materials, and care plan.   Face to Face appointment with care management team member scheduled for:  12/03/20  Kennon Holter, PharmD Clinical Pharmacist [facility] [phone]  CLINICAL CARE PLAN: Patient Care Plan: Medication Management     Problem Identified: T2DM, HTN, HLD, Weight Management   Priority: High  Onset Date: 11/12/2020     Long-Range Goal: Disease Progression Prevention   Start Date: 11/12/2020  Expected End Date: 02/10/2021  This Visit's Progress: On track  Priority: High  Note:   Current Barriers:  Unable to achieve control of diabetes and hyperlipidemia Suboptimal therapeutic regimen for diabetes and hyperlipidemia  Pharmacist Clinical Goal(s):  Patient will achieve control of diabetes and hyperlipidemia as evidenced by improved fasting blood sugar, improved A1c, improved LDL, and improved triglycerides adhere to plan to optimize therapeutic regimen for diabetes and hyperlipidemia as evidenced by report of adherence to recommended medication management changes through collaboration with PharmD and provider.   Interventions: 1:1 collaboration with Kathyrn Drown, MD regarding development and update of comprehensive plan of care as evidenced by provider attestation and co-signature Inter-disciplinary care team collaboration (see longitudinal plan of care) Comprehensive medication review performed; medication list updated in electronic medical record  Type 2 Diabetes - New goal.: Uncontrolled; Most recent A1c above goal of <7% per ADA guidelines Current medications: metformin 1,000 mg by mouth twice daily Patient reports fear of needles and did not like using insulin in the past. Does not want to follow-up with endocrinology. Intolerances: none Taking medications as directed: yes Side effects thought to be attributed to current medication regimen: no Hypoglycemia prevention: not indicated at this time Current meal patterns:  follows with [name] dialectician;  is working on cutting  out soda, juice, and sweet tea; also working on  reducing carb intake from foods such as sweets, rice, potatoes, and breads Current exercise: not discussed today On a statin: no, but will restart rosuvastatin 40 mg by mouth daily On aspirin 81 mg daily: no Last microalbumin: <6; on an ACEi/ARB: no Last eye exam: completed within last year Last foot exam: overdue Current glucose readings:  Patient did not bring meter of logs today. Verbally reports wide range of blood glucose in the 100s-200s but none in the 300s. She reports that she checks blood glucose three time daily (fasting, before lunch, and at bedtime). Unclear if blood glucose recently improved due to diet changes. Unable to verify today. Will follow-up in 3 weeks to review meter.   Continue metformin 1,000 mg by mouth twice daily Add tirzepatide (Mounjaro) 2.5 mg subcutaneously once weekly for 4 doses then increase to 5 mg subcutaneously once weekly Restart statin Instructed to monitor blood sugars three times a day at the following times: fasting (at least 8 hours since last food consumption), 5-15 minutes before lunch, bedtime, and whenever patient experiences symptoms of hypo/hyperglycemia  Patient was instructed on appropriate self administration technique for injection of Mounjaro Patient identified as a good candidate for a GLP-1 receptor agonist given reduction in cardiovascular disease, slowed chronic kidney disease progression, low risk of hypoglycemia, increased satiety , and weight loss. Patient denies a personal or family history of medullary thyroid carcinoma (MTC) or Multiple Endocrine Neoplasia syndrome type 2 (MEN 2). Patient also denies any history of pancreatitis or biliary disease. Patient instructed to contact local [place] office to make sure she has Medicare Extra Help (AKA Medicare LIS) to make sure Mounjaro copay is affordable. Patient requests refill on blood glucose test strips today. Refill sent.   Hypertension - Goal Met.: Blood pressure under good control. Blood  pressure is at goal of <130/80 mmHg per 2017 AHA/ACC guidelines. Current medications:  none Current home blood pressure: not discussed today Blood pressure medications not indicated at present Continue to monitor blood pressure  Hyperlipidemia - New goal.: Uncontrolled. LDL above goal of <70 due to very high risk given 10-year risk >20% per 2020 AACE/ACE guidelines. Triglycerides above goal of <150 per 2020 AACE/ACE guidelines. Current medications:  none, patient was previously taking rosuvastatin 40 mg by mouth daily but stopped all of her medications when she started having severe itching Intolerances: none Taking medications as directed: n/a Side effects thought to be attributed to current medication regimen: n/a Encourage dietary reduction of high fat containing foods such as butter, nuts, bacon, egg yolks, etc. Discussed need for and importance of continued work on weight loss Reviewed risks of hyperlipidemia, principles of treatment and consequences of untreated hyperlipidemia Discussed need for medication compliance Re-check lipid panel in 4-12 weeks Restart rosuvastatin 40 mg by mouth daily If LDL remains above goal on high intensity statin then will recommend addition of ezetimibe 10 mg by mouth daily.   Overweight/Obesity - New goal.: Unable to achieve goal weight loss through lifestyle modification alone Current treatment:  none   History of bariatric surgery:  unknown Baseline weight: 285 lbs; most recent weight: 285 lbs Current meal patterns:  follows with dietician Current exercise: not discussed today Recommend that the patient emphasize lean proteins, fruits and vegetables, whole grains and increased fiber consumption, adequate hydration Recommend diet modification to induce energy deficit of 500 kcal/day or greater Add Mounjaro as above  Patient Goals/Self-Care Activities Patient will:  Take medications as prescribed  Check blood sugar three times a day at the  following times: fasting (at least 8 hours since last food consumption), 5-15 minutes before lunch, bedtime, and whenever patient experiences symptoms of hypo/hyperglycemia, document, and provide at future appointments Collaborate with provider on medication access solutions Engage in dietary modifications by less frequent dining out, decreased fat intake, and fewer sweetened foods & beverages  Follow Up Plan: Face to Face appointment with care management team member scheduled for: 12/03/20        Based on our conversation today, please contact the local social security office to discuss applying for Medicare Extra Help (also known as low income subsidy).    Social Security Office [address] [place], [place] [address] [phone]   Darcel Bayley (tirzepatide)   What is this medicine used for: Used to lower your blood sugar and treat your diabetes  How is this medicine supplied? One pen is for one use only Each box contains 4 pens  How to take the medicine:  Preparing the pen before each use: Remove the pen from the refrigerator Make sure the pen is locked Hold the pen straight up and pull the gray base cap straight off. Throw the base cap away in your trash can. Using the pen and injecting your dose: Clean the injection site with an alcohol swab or soap and water. Do not touch the injection site after cleaning. Place the clear base firmly against the skin on your stomach, thigh, or upper arm Unlock the pen by turning the [employer] to the unlock symbol. Press down and hold on the purple injection button. You will hear a loud click. Continue holding the pen against your skin until you hear a second click (about 5 to 10 seconds) Remove the pen from your skin  Immediately throw away the pen by placing in a puncture-resistant/sharps container The injection is given once per week. Use a different injection site each week.  Most common side effects: Nausea Diarrhea or constipation   Vomiting Decreased appetite  Storage: Store unused pens in the refrigerator. Do not freeze. Your pen can be stored at room temperature for up to a total of 21 days.

## 2020-11-12 NOTE — Chronic Care Management (AMB) (Signed)
Chronic Care Management Pharmacy Note  11/12/2020 Name:  [name] MRN:  [id number] DOB:  March 10, 1950  Summary:  Type 2 Diabetes Current glucose readings:  Patient did not bring meter of logs today. Verbally reports wide range of blood glucose in the 100s-200s but none in the 300s. She reports that she checks blood glucose three time daily (fasting, before lunch, and at bedtime). Unclear if blood glucose recently improved due to diet changes. Unable to verify today. Will follow-up in 3 weeks to review meter.   Continue metformin 1,000 mg by mouth twice daily Add tirzepatide (Mounjaro) 2.5 mg subcutaneously once weekly for 4 doses then increase to 5 mg subcutaneously once weekly Restart statin Patient instructed to contact local [employer] office to make sure she has Medicare Extra Help (AKA Medicare LIS) to make sure Mounjaro copay is affordable. Patient requests refill on blood glucose test strips today. Refill sent.   Hyperlipidemia  Restart rosuvastatin 40 mg by mouth daily Re-check lipid panel in 4-12 weeks If LDL remains above goal on high intensity statin then will recommend addition of ezetimibe 10 mg by mouth daily.   Overweight/Obesity Follows with dietician Recommend that the patient emphasize lean proteins, fruits and vegetables, whole grains and increased fiber consumption, adequate hydration Recommend diet modification to induce energy deficit of 500 kcal/day or greater Add Mounjaro as above  Subjective: [name] is an 70 y.o. year old female who is a primary patient of Luking, Elayne Snare, MD.  The CCM team was consulted for assistance with disease management and care coordination needs.    Engaged with patient face to face for initial visit in response to provider referral for pharmacy case management and/or care coordination services.   Consent to Services:  The patient was given the following information about Chronic Care Management services today, agreed to  services, and gave verbal consent: 1. CCM service includes personalized support from designated clinical staff supervised by the primary care provider, including individualized plan of care and coordination with other care providers 2. 24/7 contact phone numbers for assistance for urgent and routine care needs. 3. Service will only be billed when office clinical staff spend 20 minutes or more in a month to coordinate care. 4. Only one practitioner may furnish and bill the service in a calendar month. 5.The patient may stop CCM services at any time (effective at the end of the month) by phone call to the office staff. 6. The patient will be responsible for cost sharing (co-pay) of up to 20% of the service fee (after annual deductible is met). Patient agreed to services and consent obtained.  Patient Care Team: Kathyrn Drown, MD as PCP - General (Family Medicine) Satira Sark, MD as PCP - Cardiology (Cardiology) Pieter Partridge, DO as Consulting Physician (Neurology) Eloise Harman, DO as Consulting Physician (Internal Medicine) Beryle Lathe, [facility] (Pharmacist)  Objective:  Lab Results  Component Value Date   CREATININE 0.95 09/03/2020   CREATININE 0.97 10/24/2019   CREATININE 0.99 11/18/2018    Lab Results  Component Value Date   HGBA1C 15.2 (H) 09/03/2020   Last diabetic Eye exam:  Lab Results  Component Value Date/Time   HMDIABEYEEXA No Retinopathy 10/17/2020 12:00 AM    Last diabetic Foot exam: No results found for: HMDIABFOOTEX      Component Value Date/Time   CHOL 341 (H) 09/03/2020 1016   TRIG 190 (H) 09/03/2020 1016   HDL 47 09/03/2020 1016   CHOLHDL  7.3 (H) 09/03/2020 1016   CHOLHDL 4.6 12/27/2013 0710   VLDL 22 12/27/2013 0710   LDLCALC 256 (H) 09/03/2020 1016    Hepatic Function Latest Ref Rng & Units 09/03/2020 10/24/2019 11/18/2018  Total Protein 6.0 - 8.5 g/dL 7.8 7.9 7.8  Albumin 3.8 - 4.8 g/dL 4.0 4.1 4.2  AST 0 - 40 IU/L 22 17 14   ALT 0 - 32  IU/L 25 21 15   Alk Phosphatase 44 - 121 IU/L 155(H) 182(H) 195(H)  Total Bilirubin 0.0 - 1.2 mg/dL 0.3 0.3 0.3  Bilirubin, Direct 0.00 - 0.40 mg/dL <0.10 - 0.08    Lab Results  Component Value Date/Time   TSH 1.550 09/03/2020 10:16 AM   FREET4 1.15 09/03/2020 10:16 AM    CBC Latest Ref Rng & Units 04/09/2015 09/14/2013 09/12/2010  WBC 3.4 - 10.8 x10E3/uL 7.7 10.0 10.5  Hemoglobin 11.1 - 15.9 g/dL 12.7 14.1 10.4(L)  Hematocrit 34.0 - 46.6 % 38.8 41.8 31.3(L)  Platelets 150 - 379 x10E3/uL 257 PLATELET CLUMPS NOTED ON SMEAR, COUNT APPEARS ADEQUATE 229    No results found for: VD25OH  Clinical ASCVD: No  The ASCVD Risk score (Arnett DK, et al., 2019) failed to calculate for the following reasons:   The valid total cholesterol range is 130 to 320 mg/dL    Social History   Tobacco Use  Smoking Status Never  Smokeless Tobacco Never   BP Readings from Last 3 Encounters:  10/31/20 108/67  10/08/20 138/77  09/03/20 114/72   Pulse Readings from Last 3 Encounters:  10/31/20 80  10/08/20 93  09/03/20 71   Wt Readings from Last 3 Encounters:  10/31/20 285 lb (129.3 kg)  10/11/20 288 lb (130.6 kg)  10/08/20 282 lb 8 oz (128.1 kg)    Assessment: Review of patient past medical history, allergies, medications, health status, including review of consultants reports, laboratory and other test data, was performed as part of comprehensive evaluation and provision of chronic care management services.   SDOH:  (Social Determinants of Health) assessments and interventions performed:    CCM Care Plan  Allergies  Allergen Reactions   Penicillins Itching    Has patient had a PCN reaction causing immediate rash, facial/tongue/throat swelling, SOB or lightheadedness with hypotension: No Has patient had a PCN reaction causing severe rash involving mucus membranes or skin necrosis: No Has patient had a PCN reaction that required hospitalization: No Has patient had a PCN reaction occurring  within the last 10 years: No If all of the above answers are "NO", then may proceed with Cephalosporin use.    Tramadol Itching   Hydrocodone Itching    Medications Reviewed Today     Reviewed by Beryle Lathe, [facility] (Pharmacist) on 11/12/20 at 1132  Med List Status: <None>   Medication Order Taking? Sig Documenting Provider Last Dose Status Informant  metFORMIN (GLUCOPHAGE) 500 MG tablet [id number] Yes Take 2 tablets (1,000 mg total) by mouth 2 (two) times daily with a meal. Kathyrn Drown, MD Taking Active             Patient Active Problem List   Diagnosis Date Noted   Vaginal itching 10/08/2020   Yeast vaginitis 10/08/2020   Encounter for screening colonoscopy 12/13/2019   Occipital neuralgia of left side 06/24/2019   Chronic intractable headache 06/24/2019   Uncontrolled type 2 diabetes mellitus with hyperglycemia ([place]) 01/19/2019   S/P total knee replacement, right 2007 01/12/2019   Special screening for malignant neoplasms, colon 12/22/2018  Type 2 diabetes mellitus with diabetic neuropathy, unspecified ([place]) 02/04/2018   Carpal tunnel syndrome 05/16/2015   Morbid obesity ([place]) 03/13/2015   Elevated alkaline phosphatase level 09/15/2013   HTN (hypertension) 09/14/2013   Type 2 diabetes mellitus with hemoglobin A1c goal of less than 7.5% ([place]) 10/04/2012   Lumbago with sciatica of left side 10/22/2010   Difficulty in walking(719.7) 10/16/2010   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 09/23/2010   Acquired trigger finger 08/15/2010   OA (osteoarthritis) of knee 08/15/2010   KNEE PAIN 05/26/2007   S/P hip replacement, left 05/26/2007   DEGENERATIVE JOINT DISEASE, LEFT HIP 12/03/2006    Immunization History  Administered Date(s) Administered   Fluad Quad(high Dose 65+) 10/24/2019, 10/31/2020   Influenza Split 10/04/2012, 09/13/2013   Influenza,inj,Quad PF,6+ Mos 09/22/2014, 10/05/2015, 11/04/2017   Influenza-Unspecified 09/14/2018   Moderna  Sars-Covid-2 Vaccination 03/15/2019, 04/12/2019   Pneumococcal Conjugate-13 04/24/2016   Pneumococcal Polysaccharide-23 06/14/2013, 10/24/2019   Td 06/14/2013   Zoster Recombinat (Shingrix) 02/03/2018    Conditions to be addressed/monitored: HTN, HLD, DMII, and weight management  Care Plan : Medication Management  Updates made by Beryle Lathe, [place] since 11/12/2020 12:00 AM     Problem: T2DM, HTN, HLD, Weight Management   Priority: High  Onset Date: 11/12/2020     Long-Range Goal: Disease Progression Prevention   Start Date: 11/12/2020  Expected End Date: 02/10/2021  This Visit's Progress: On track  Priority: High  Note:   Current Barriers:  Unable to achieve control of diabetes and hyperlipidemia Suboptimal therapeutic regimen for diabetes and hyperlipidemia  Pharmacist Clinical Goal(s):  Patient will achieve control of diabetes and hyperlipidemia as evidenced by improved fasting blood sugar, improved A1c, improved LDL, and improved triglycerides adhere to plan to optimize therapeutic regimen for diabetes and hyperlipidemia as evidenced by report of adherence to recommended medication management changes through collaboration with PharmD and provider.   Interventions: 1:1 collaboration with Kathyrn Drown, MD regarding development and update of comprehensive plan of care as evidenced by provider attestation and co-signature Inter-disciplinary care team collaboration (see longitudinal plan of care) Comprehensive medication review performed; medication list updated in electronic medical record  Type 2 Diabetes - New goal.: Uncontrolled; Most recent A1c above goal of <7% per ADA guidelines Current medications: metformin 1,000 mg by mouth twice daily Patient reports fear of needles and did not like using insulin in the past. Does not want to follow-up with endocrinology. Intolerances: none Taking medications as directed: yes Side effects thought to be attributed to  current medication regimen: no Hypoglycemia prevention: not indicated at this time Current meal patterns:  follows with [name] dialectician;  is working on cutting out soda, juice, and sweet tea; also working on reducing carb intake from foods such as sweets, rice, potatoes, and breads Current exercise: not discussed today On a statin: no, but will restart rosuvastatin 40 mg by mouth daily On aspirin 81 mg daily: no Last microalbumin: <6; on an ACEi/ARB: no Last eye exam: completed within last year Last foot exam: overdue Current glucose readings:  Patient did not bring meter of logs today. Verbally reports wide range of blood glucose in the 100s-200s but none in the 300s. She reports that she checks blood glucose three time daily (fasting, before lunch, and at bedtime). Unclear if blood glucose recently improved due to diet changes. Unable to verify today. Will follow-up in 3 weeks to review meter.   Continue metformin 1,000 mg by mouth twice daily Add tirzepatide Darcel Bayley)  2.5 mg subcutaneously once weekly for 4 doses then increase to 5 mg subcutaneously once weekly Restart statin Instructed to monitor blood sugars three times a day at the following times: fasting (at least 8 hours since last food consumption), 5-15 minutes before lunch, bedtime, and whenever patient experiences symptoms of hypo/hyperglycemia  Patient was instructed on appropriate self administration technique for injection of Mounjaro Patient identified as a good candidate for a GLP-1 receptor agonist given reduction in cardiovascular disease, slowed chronic kidney disease progression, low risk of hypoglycemia, increased satiety , and weight loss. Patient denies a personal or family history of medullary thyroid carcinoma (MTC) or Multiple Endocrine Neoplasia syndrome type 2 (MEN 2). Patient also denies any history of pancreatitis or biliary disease. Patient instructed to contact local [place] office to make sure she has  Medicare Extra Help (AKA Medicare LIS) to make sure Mounjaro copay is affordable. Patient requests refill on blood glucose test strips today. Refill sent.   Hypertension - Goal Met.: Blood pressure under good control. Blood pressure is at goal of <130/80 mmHg per 2017 AHA/ACC guidelines. Current medications:  none Current home blood pressure: not discussed today Blood pressure medications not indicated at present Continue to monitor blood pressure  Hyperlipidemia - New goal.: Uncontrolled. LDL above goal of <70 due to very high risk given 10-year risk >20% per 2020 AACE/ACE guidelines. Triglycerides above goal of <150 per 2020 AACE/ACE guidelines. Current medications:  none, patient was previously taking rosuvastatin 40 mg by mouth daily but stopped all of her medications when she started having severe itching Intolerances: none Taking medications as directed: n/a Side effects thought to be attributed to current medication regimen: n/a Encourage dietary reduction of high fat containing foods such as butter, nuts, bacon, egg yolks, etc. Discussed need for and importance of continued work on weight loss Reviewed risks of hyperlipidemia, principles of treatment and consequences of untreated hyperlipidemia Discussed need for medication compliance Re-check lipid panel in 4-12 weeks Restart rosuvastatin 40 mg by mouth daily If LDL remains above goal on high intensity statin then will recommend addition of ezetimibe 10 mg by mouth daily.   Overweight/Obesity - New goal.: Unable to achieve goal weight loss through lifestyle modification alone Current treatment:  none   History of bariatric surgery:  unknown Baseline weight: 285 lbs; most recent weight: 285 lbs Current meal patterns:  follows with dietician Current exercise: not discussed today Recommend that the patient emphasize lean proteins, fruits and vegetables, whole grains and increased fiber consumption, adequate hydration Recommend  diet modification to induce energy deficit of 500 kcal/day or greater Add Mounjaro as above  Patient Goals/Self-Care Activities Patient will:  Take medications as prescribed Check blood sugar three times a day at the following times: fasting (at least 8 hours since last food consumption), 5-15 minutes before lunch, bedtime, and whenever patient experiences symptoms of hypo/hyperglycemia, document, and provide at future appointments Collaborate with provider on medication access solutions Engage in dietary modifications by less frequent dining out, decreased fat intake, and fewer sweetened foods & beverages  Follow Up Plan: Face to Face appointment with care management team member scheduled for: 12/03/20      Medication Assistance:  Patient to make sure she has Medicare Extra Help  Patient's preferred pharmacy is:  [facility] - [place], [address] [place] [address] Phone: [phone] Fax: [phone]  Follow Up:  Patient agrees to Care Plan and Follow-up.  Plan: Face to Face  appointment with care management team member scheduled for: 12/03/20  Kennon Holter, PharmD Clinical Pharmacist [place] [phone]

## 2020-11-15 ENCOUNTER — Encounter: Payer: Self-pay | Admitting: Nutrition

## 2020-12-03 ENCOUNTER — Ambulatory Visit (INDEPENDENT_AMBULATORY_CARE_PROVIDER_SITE_OTHER): Payer: Medicare HMO | Admitting: Pharmacist

## 2020-12-03 ENCOUNTER — Other Ambulatory Visit: Payer: Self-pay

## 2020-12-03 VITALS — Wt 288.0 lb

## 2020-12-03 DIAGNOSIS — I10 Essential (primary) hypertension: Secondary | ICD-10-CM

## 2020-12-03 DIAGNOSIS — E1169 Type 2 diabetes mellitus with other specified complication: Secondary | ICD-10-CM

## 2020-12-03 DIAGNOSIS — E1165 Type 2 diabetes mellitus with hyperglycemia: Secondary | ICD-10-CM

## 2020-12-03 NOTE — Chronic Care Management (AMB) (Signed)
Chronic Care Management Pharmacy Note  12/03/2020 Name:  [name] MRN:  [id number] DOB:  [birth year]  Summary:  Type 2 Diabetes  Patient has cut out soda, juice, sweet tea, and carb intake from foods such as sweets, rice, potatoes, and breads Review of blood glucose meter and log today demonstrated drastic improvement in blood glucose over last 3 weeks. BG remained mostly in the 200s last week of October but have improved to mostly 100s without hypoglycemia since November 1st. Average blood glucose over last 7 days has been 148   Continue metformin 1,000 mg by mouth twice daily Continue tirzepatide (Mounjaro) 2.5 mg subcutaneously once weekly for 1 more dose (4 doses total) then increase to 5 mg subcutaneously once weekly  Hyperlipidemia Continue rosuvastatin 40 mg by mouth daily If LDL remains above goal on high intensity statin then will recommend addition of ezetimibe 10 mg by mouth daily.   Overweight/Obesity Recommend that the patient emphasize lean proteins, fruits and vegetables, whole grains and increased fiber consumption, adequate hydration Recommend diet modification to induce energy deficit of 500 kcal/day or greater Continue to titrate Mounjaro as able to tolerate  Subjective: [name] is an 70 y.o. year old female who is a primary patient of Luking, Elayne Snare, MD.  The CCM team was consulted for assistance with disease management and care coordination needs.    Engaged with patient face to face for follow up visit in response to provider referral for pharmacy case management and/or care coordination services.   Consent to Services:  The patient was given information about Chronic Care Management services, agreed to services, and gave verbal consent prior to initiation of services.  Please see initial visit note for detailed documentation.   Patient Care Team: Kathyrn Drown, MD as PCP - General (Family Medicine) Satira Sark, MD as PCP - Cardiology  (Cardiology) Pieter Partridge, DO as Consulting Physician (Neurology) Eloise Harman, DO as Consulting Physician (Internal Medicine) Beryle Lathe, [facility] (Pharmacist)  Objective:  Lab Results  Component Value Date   CREATININE 0.95 09/03/2020   CREATININE 0.97 10/24/2019   CREATININE 0.99 11/18/2018    Lab Results  Component Value Date   HGBA1C 15.2 (H) 09/03/2020   Last diabetic Eye exam:  Lab Results  Component Value Date/Time   HMDIABEYEEXA No Retinopathy 10/17/2020 12:00 AM    Last diabetic Foot exam: No results found for: HMDIABFOOTEX      Component Value Date/Time   CHOL 341 (H) 09/03/2020 1016   TRIG 190 (H) 09/03/2020 1016   HDL 47 09/03/2020 1016   CHOLHDL 7.3 (H) 09/03/2020 1016   CHOLHDL 4.6 12/27/2013 0710   VLDL 22 12/27/2013 0710   LDLCALC 256 (H) 09/03/2020 1016    Hepatic Function Latest Ref Rng & Units 09/03/2020 10/24/2019 11/18/2018  Total Protein 6.0 - 8.5 g/dL 7.8 7.9 7.8  Albumin 3.8 - 4.8 g/dL 4.0 4.1 4.2  AST 0 - 40 IU/L _0 ALT 0 - 32 IU/L _1 Alk Phosphatase 44 - 121 IU/L 155(H) 182(H) 195(H)  Total Bilirubin 0.0 - 1.2 mg/dL 0.3 0.3 0.3  Bilirubin, Direct 0.00 - 0.40 mg/dL <0.10 - 0.08    Lab Results  Component Value Date/Time   TSH 1.550 09/03/2020 10:16 AM   FREET4 1.15 09/03/2020 10:16 AM    CBC Latest Ref Rng & Units 04/09/2015 09/14/2013 09/12/2010  WBC 3.4 - 10.8 x10E3/uL 7.7 10.0 10.5  Hemoglobin 11.1 -  15.9 g/dL 12.7 14.1 10.4(L)  Hematocrit 34.0 - 46.6 % 38.8 41.8 31.3(L)  Platelets 150 - 379 x10E3/uL 257 PLATELET CLUMPS NOTED ON SMEAR, COUNT APPEARS ADEQUATE 229    No results found for: VD25OH  Clinical ASCVD: No  The ASCVD Risk score (Arnett DK, et al., 2019) failed to calculate for the following reasons:   The valid total cholesterol range is 130 to 320 mg/dL    Social History   Tobacco Use  Smoking Status Never  Smokeless Tobacco Never   BP Readings from Last 3 Encounters:  10/31/20 108/67   10/08/20 138/77  09/03/20 114/72   Pulse Readings from Last 3 Encounters:  10/31/20 80  10/08/20 93  09/03/20 71   Wt Readings from Last 3 Encounters:  10/31/20 285 lb (129.3 kg)  10/11/20 288 lb (130.6 kg)  10/08/20 282 lb 8 oz (128.1 kg)    Assessment: Review of patient past medical history, allergies, medications, health status, including review of consultants reports, laboratory and other test data, was performed as part of comprehensive evaluation and provision of chronic care management services.   SDOH:  (Social Determinants of Health) assessments and interventions performed:    CCM Care Plan  Allergies  Allergen Reactions   Penicillins Itching    Has patient had a PCN reaction causing immediate rash, facial/tongue/throat swelling, SOB or lightheadedness with hypotension: No Has patient had a PCN reaction causing severe rash involving mucus membranes or skin necrosis: No Has patient had a PCN reaction that required hospitalization: No Has patient had a PCN reaction occurring within the last 10 years: No If all of the above answers are "NO", then may proceed with Cephalosporin use.    Tramadol Itching   Hydrocodone Itching    Medications Reviewed Today     Reviewed by Beryle Lathe, [facility] (Pharmacist) on 12/03/20 at 1039  Med List Status: <None>   Medication Order Taking? Sig Documenting Provider Last Dose Status Informant  glucose blood test strip [id number] Yes Use as instructed to test blood glucose three time daily Luking, Elayne Snare, MD Taking Active   metFORMIN (GLUCOPHAGE) 500 MG tablet [id number] Yes Take 2 tablets (1,000 mg total) by mouth 2 (two) times daily with a meal. Luking, Elayne Snare, MD Taking Active   rosuvastatin (CRESTOR) 40 MG tablet [id number] Yes Take 1 tablet (40 mg total) by mouth daily. Kathyrn Drown, MD Taking Active   tirzepatide [facility]) 2.5 MG/0.5ML Pen [id number] Yes Inject 2.5 mg into the skin once a week. Kathyrn Drown, MD Taking  Active   tirzepatide [facility]) 5 MG/0.5ML Pen [id number] No Inject 5 mg into the skin once a week.  Patient not taking: Reported on 12/03/2020   Kathyrn Drown, MD Not Taking Active             Patient Active Problem List   Diagnosis Date Noted   Vaginal itching 10/08/2020   Yeast vaginitis 10/08/2020   Encounter for screening colonoscopy 12/13/2019   Occipital neuralgia of left side 06/24/2019   Chronic intractable headache 06/24/2019   Uncontrolled type 2 diabetes mellitus with hyperglycemia ([place]) 01/19/2019   S/P total knee replacement, right 2007 01/12/2019   Special screening for malignant neoplasms, colon 12/22/2018   Type 2 diabetes mellitus with diabetic neuropathy, unspecified ([place]) 02/04/2018   Carpal tunnel syndrome 05/16/2015   Morbid obesity ([place]) 03/13/2015   Elevated alkaline phosphatase level 09/15/2013   HTN (hypertension) 09/14/2013   Type 2 diabetes mellitus with hemoglobin A1c goal of  less than 7.5% ([place]) 10/04/2012   Lumbago with sciatica of left side 10/22/2010   Difficulty in walking(719.7) 10/16/2010   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 09/23/2010   Acquired trigger finger 08/15/2010   OA (osteoarthritis) of knee 08/15/2010   KNEE PAIN 05/26/2007   S/P hip replacement, left 05/26/2007   DEGENERATIVE JOINT DISEASE, LEFT HIP 12/03/2006    Immunization History  Administered Date(s) Administered   Fluad Quad(high Dose 65+) 10/24/2019, 10/31/2020   Influenza Split 10/04/2012, 09/13/2013   Influenza,inj,Quad PF,6+ Mos 09/22/2014, 10/05/2015, 11/04/2017   Influenza-Unspecified 09/14/2018   Moderna Sars-Covid-2 Vaccination 03/15/2019, 04/12/2019   Pneumococcal Conjugate-13 04/24/2016   Pneumococcal Polysaccharide-23 06/14/2013, 10/24/2019   Td 06/14/2013   Zoster Recombinat (Shingrix) 02/03/2018    Conditions to be addressed/monitored: HTN, HLD, DMII, and weight management  Care Plan : Medication Management  Updates made by Beryle Lathe, [place] since 12/03/2020 12:00 AM     Problem: T2DM, HTN, HLD, Weight Management   Priority: High  Onset Date: 11/12/2020     Long-Range Goal: Disease Progression Prevention   Start Date: 11/12/2020  Expected End Date: 02/10/2021  Recent Progress: On track  Priority: High  Note:   Current Barriers:  Unable to achieve control of diabetes and hyperlipidemia Suboptimal therapeutic regimen for diabetes and hyperlipidemia  Pharmacist Clinical Goal(s):  Patient will achieve control of diabetes and hyperlipidemia as evidenced by improved fasting blood sugar, improved A1c, improved LDL, and improved triglycerides adhere to plan to optimize therapeutic regimen for diabetes and hyperlipidemia as evidenced by report of adherence to recommended medication management changes through collaboration with PharmD and provider.   Interventions: 1:1 collaboration with Kathyrn Drown, MD regarding development and update of comprehensive plan of care as evidenced by provider attestation and co-signature Inter-disciplinary care team collaboration (see longitudinal plan of care) Comprehensive medication review performed; medication list updated in electronic medical record  Type 2 Diabetes - Goal on Track (progressing): YES. Uncontrolled; Most recent A1c above goal of <7% per ADA guidelines Current medications: metformin 1,000 mg by mouth twice daily and tirzepatide (Mounjaro) 2.5 mg subcutaneously once weekly Intolerances: none Taking medications as directed: yes Side effects thought to be attributed to current medication regimen: no Hypoglycemia prevention: not indicated at this time Current meal patterns:  follows with [name] dietician;  has cut out soda, juice, sweet tea, and carb intake from foods such as sweets, rice, potatoes, and breads Current exercise:  does some home workouts a few times per week On a statin: yes On aspirin 81 mg daily: no Last microalbumin: <6; on an  ACEi/ARB: no Last eye exam: completed within last year Last foot exam: overdue Current glucose readings:  Review of blood glucose meter and log today demonstrated drastic improvement in blood glucose over last 3 weeks. BG remained mostly in the 200s last week of October but have improved to mostly 100s without hypoglycemia since November 1st. Average blood glucose over last 7 days has been 148   Continue metformin 1,000 mg by mouth twice daily Continue tirzepatide (Mounjaro) 2.5 mg subcutaneously once weekly for 1 more dose (4 doses total) then increase to 5 mg subcutaneously once weekly Instructed to monitor blood sugars three times a day at the following times: fasting (at least 8 hours since last food consumption), 5-15 minutes before lunch, bedtime, and whenever patient experiences symptoms of hypo/hyperglycemia  Patient was instructed on treatment of hypoglycemia. If you have a low blood sugar less than 70, please eat 15 grams  of carbohydrates (4 oz of juice, soda, 4 glucose tablets, or 3-4 pieces of hard candy). Wait 15 minutes and then recheck your blood sugar. If your sugar is still less than 70, eat another 15 grams of carbohydrates. Wait another 15 minutes and recheck your glucose. Continue this until your sugar is over 70. Then, eat a snack with protein in it to prevent your sugar from dropping again. Patient was previously instructed on appropriate self administration technique for injection of Mounjaro  Hypertension - Goal Met.: Blood pressure under good control. Blood pressure is at goal of <130/80 mmHg per 2017 AHA/ACC guidelines. Current medications:  none Current home blood pressure: not discussed today Blood pressure medications not indicated at present Continue to monitor blood pressure  Hyperlipidemia - Goal on Track (progressing): YES.: Uncontrolled. LDL above goal of <70 due to very high risk given 10-year risk >20% per 2020 AACE/ACE guidelines. Triglycerides above goal of  <150 per 2020 AACE/ACE guidelines. Current medications: rosuvastatin 40 mg by mouth once daily Intolerances: none Taking medications as directed: yes Side effects thought to be attributed to current medication regimen: no Encourage dietary reduction of high fat containing foods such as butter, nuts, bacon, egg yolks, etc. Discussed need for and importance of continued work on weight loss Reviewed risks of hyperlipidemia, principles of treatment and consequences of untreated hyperlipidemia Discussed need for medication compliance Re-check lipid panel in 4-12 weeks Continue rosuvastatin 40 mg by mouth daily If LDL remains above goal on high intensity statin then will recommend addition of ezetimibe 10 mg by mouth daily.   Overweight/Obesity - Goal on Track (progressing): YES.: Unable to achieve goal weight loss through lifestyle modification alone Current treatment: tirzepatide (Mounjaro) 2.5 mg subcutaneously once weekly  History of bariatric surgery:  unknown Baseline weight: 285 lbs; most recent weight: 288 lbs Current meal patterns:  follows with dietician Recommend that the patient emphasize lean proteins, fruits and vegetables, whole grains and increased fiber consumption, adequate hydration Recommend diet modification to induce energy deficit of 500 kcal/day or greater Continue to titrate Mounjaro as able to tolerate  Patient Goals/Self-Care Activities Patient will:  Take medications as prescribed Check blood sugar three times a day at the following times: fasting (at least 8 hours since last food consumption), 5-15 minutes before lunch, bedtime, and whenever patient experiences symptoms of hypo/hyperglycemia, document, and provide at future appointments Collaborate with provider on medication access solutions Engage in dietary modifications by less frequent dining out, decreased fat intake, and fewer sweetened foods & beverages  Follow Up Plan: Face to Face appointment with care  management team member scheduled for: 12/31/20      Medication Assistance: None required.  Patient affirms current coverage meets needs.  Patient's preferred pharmacy is:  [facility] [place], [place] [address] Phone: [phone] Fax: [phone]  Follow Up:  Patient agrees to Care Plan and Follow-up.  Plan: Face to Face appointment with care management team member scheduled for: 12/31/20  Kennon Holter, PharmD, [place] Pharmacist [place] [phone]

## 2020-12-03 NOTE — Patient Instructions (Addendum)
[name],  It was great to talk to you today!  Please call me with any questions or concerns.   Visit Information  Thank you for taking time to visit with me today. Please don't hesitate to contact me if I can be of assistance to you before our next scheduled telephone appointment.  Following are the goals we discussed today:  Patient Goals/Self-Care Activities Patient will:  Take medications as prescribed Check blood sugar three times a day at the following times: fasting (at least 8 hours since last food consumption), 5-15 minutes before lunch, bedtime, and whenever patient experiences symptoms of hypo/hyperglycemia, document, and provide at future appointments Collaborate with provider on medication access solutions Engage in dietary modifications by less frequent dining out, decreased fat intake, and fewer sweetened foods & beverages  Your next appointment with me is on 12/31/20 at 10:30 AM  Please call the care guide team at [phone] if you need to cancel or reschedule your appointment.   Please call [phone]-TALK (toll free, 24 hour hotline) if you are experiencing a Mental Health or [place] or need someone to talk to.  Print copy of patient instructions, educational materials, and care plan provided in person.  Kennon Holter, PharmD, [place] Clinical Pharmacist [place] [phone]  It can be hard choosing a Medicare plan. A group that I always recommend for my patients is called [facility]). With this program, they offer free counseling to Medicare beneficiaries and caregivers about Medicare, Medicare supplements, Medicare Advantage, Medicare Part D, and long-term care insurance. [place] counselors are not Lex[employer], and they do not sell or endorse any product, plan, or company. The counselors are volunteers and they always offer unbiased information regarding  Medicare health care products.  [facility] has counselors in every county across the state who are trained to be the [place] people for seniors and Medicare beneficiaries in their local communities. Local counseling is done by appointment in each county.   [facility] local counseling team's information can be found below. I recommend that you give them a call and set up an appointment (Ask for [place]).    [facility]     [address] [place]  [address] [phone]   For more information: [email or web address] or just google "[place] [facility]"

## 2020-12-04 ENCOUNTER — Ambulatory Visit (INDEPENDENT_AMBULATORY_CARE_PROVIDER_SITE_OTHER): Payer: Medicare HMO | Admitting: Family Medicine

## 2020-12-04 ENCOUNTER — Other Ambulatory Visit: Payer: Self-pay

## 2020-12-04 ENCOUNTER — Encounter: Payer: Self-pay | Admitting: Gastroenterology

## 2020-12-04 ENCOUNTER — Telehealth: Payer: Self-pay

## 2020-12-04 ENCOUNTER — Ambulatory Visit (INDEPENDENT_AMBULATORY_CARE_PROVIDER_SITE_OTHER): Payer: Medicare HMO | Admitting: Gastroenterology

## 2020-12-04 VITALS — BP 128/72 | HR 88 | Ht 67.0 in | Wt 288.4 lb

## 2020-12-04 VITALS — BP 129/74 | HR 77 | Temp 97.0°F | Ht 67.0 in | Wt 288.6 lb

## 2020-12-04 DIAGNOSIS — Z1211 Encounter for screening for malignant neoplasm of colon: Secondary | ICD-10-CM | POA: Diagnosis not present

## 2020-12-04 DIAGNOSIS — E785 Hyperlipidemia, unspecified: Secondary | ICD-10-CM

## 2020-12-04 DIAGNOSIS — E119 Type 2 diabetes mellitus without complications: Secondary | ICD-10-CM

## 2020-12-04 DIAGNOSIS — E1169 Type 2 diabetes mellitus with other specified complication: Secondary | ICD-10-CM

## 2020-12-04 LAB — HEMOGLOBIN A1C
Est. average glucose Bld gHb Est-mCnc: 249 mg/dL
Hgb A1c MFr Bld: 10.3 % — ABNORMAL HIGH (ref 4.8–5.6)

## 2020-12-04 LAB — BASIC METABOLIC PANEL
BUN/Creatinine Ratio: 10 — ABNORMAL LOW (ref 12–28)
BUN: 11 mg/dL (ref 8–27)
CO2: 24 mmol/L (ref 20–29)
Calcium: 9.7 mg/dL (ref 8.7–10.3)
Chloride: 101 mmol/L (ref 96–106)
Creatinine, Ser: 1.08 mg/dL — ABNORMAL HIGH (ref 0.57–1.00)
Glucose: 114 mg/dL — ABNORMAL HIGH (ref 70–99)
Potassium: 4.3 mmol/L (ref 3.5–5.2)
Sodium: 139 mmol/L (ref 134–144)
eGFR: 55 mL/min/{1.73_m2} — ABNORMAL LOW (ref >59)

## 2020-12-04 MED ORDER — PEG 3350-KCL-NA BICARB-NACL 420 G PO SOLR: 4000.0000 mL | ORAL | 0 refills | Status: DC

## 2020-12-04 NOTE — Telephone Encounter (Signed)
PA for TCS submitted via HealthHelp website. [employer]# [id number], valid 12/24/20-01/23/21.

## 2020-12-04 NOTE — H&P (View-Only) (Signed)
Primary Care Physician: Kathyrn Drown, MD  Primary Gastroenterologist:  Elon Alas. Abbey Chatters, DO   Chief Complaint  Patient presents with   Follow-up    Doing fine. No concerns. Needs to schedule TCS. Did not complete last year    HPI: [name] is a 70 y.o. female here for a follow up. She was seen in 11/2019 to schedule 10 year colonoscopy. She never scheduled procedure.   She presents wanting to schedule her colonoscopy now. Last colonoscopy in 2010, slightly tortuous colon, internal hemorrhoids. Denies constipation, diarrhea, melena, brbpr, abdominal pain, n/v, heartburn, dysphagia.      Current Outpatient Medications  Medication Sig Dispense Refill   glucose blood test strip Use as instructed to test blood glucose three time daily 100 each 12   metFORMIN (GLUCOPHAGE) 500 MG tablet Take 2 tablets (1,000 mg total) by mouth 2 (two) times daily with a meal. 360 tablet 0   rosuvastatin (CRESTOR) 40 MG tablet Take 1 tablet (40 mg total) by mouth daily. 30 tablet 11   tirzepatide (MOUNJARO) 2.5 MG/0.5ML Pen Inject 2.5 mg into the skin once a week. 2 mL 0   No current facility-administered medications for this visit.    Allergies as of 12/04/2020 - Review Complete 12/04/2020  Allergen Reaction Noted   Penicillins Itching 10/25/2016   Tramadol Itching 07/12/2018   Hydrocodone Itching 11/23/2012   Past Medical History:  Diagnosis Date   Arthritis    Essential hypertension    Finger amputation, traumatic Age 42   [place]   Hyperlipidemia    Obesity    PONV (postoperative nausea and vomiting)    Type 2 diabetes mellitus ([place])    Type 2 diabetes mellitus with diabetic neuropathy, unspecified ([place]) 02/04/2018   Past Surgical History:  Procedure Laterality Date   ABDOMINAL HYSTERECTOMY     COLONOSCOPY  12/2008   Dr. Oneida Alar: slightly tortuous colon. internal hemorrhoids. next colonoscopy in 10 years.    HERNIA REPAIR     x2   OOPHORECTOMY     REPLACEMENT TOTAL KNEE  Right    TOTAL HIP ARTHROPLASTY Left 2008?   [facility], [place]   TOTAL KNEE ARTHROPLASTY  09/09/2010   Procedure: TOTAL KNEE ARTHROPLASTY;  Surgeon: Arther Abbott, MD;  Location: [facility];  Service: Orthopedics;  Laterality: Left;  With [place]   Family History  Problem Relation Age of Onset   Alcohol abuse Father    Heart attack Mother    Other Brother        gunshot   Alcohol abuse Brother    Heart attack Sister    Breast cancer Sister    Arthritis Other    Colon cancer Neg Hx    Social History   Tobacco Use   Smoking status: Never   Smokeless tobacco: Never  Vaping Use   Vaping Use: Never used  Substance Use Topics   Alcohol use: No   Drug use: Never    ROS:  General: Negative for anorexia, weight loss, fever, chills, fatigue, weakness. ENT: Negative for hoarseness, difficulty swallowing , nasal congestion. CV: Negative for chest pain, angina, palpitations, dyspnea on exertion, peripheral edema.  Respiratory: Negative for dyspnea at rest, dyspnea on exertion, cough, sputum, wheezing.  GI: See history of present illness. GU:  Negative for dysuria, hematuria, urinary incontinence, urinary frequency, nocturnal urination.  Endo: Negative for unusual weight change.    Physical Examination:   BP 129/74   Pulse 77   Temp (!) 97  F (36.1 C)   Ht 5\' 7"  (1.702 m)   Wt 288 lb 9.6 oz (130.9 kg)   BMI 45.20 kg/m   General: Well-nourished, well-developed in no acute distress.  Eyes: No icterus. Mouth: masked Lungs: Clear to auscultation bilaterally.  Heart: Regular rate and rhythm, no murmurs rubs or gallops.  Abdomen: Bowel sounds are normal, nontender, nondistended, no hepatosplenomegaly or masses, no abdominal bruits or hernia , no rebound or guarding.   Extremities: No lower extremity edema. No clubbing or deformities. Neuro: Alert and oriented x 4   Skin: Warm and dry, no jaundice.   Psych: Alert and cooperative, normal mood and affect.  Labs:  Lab Results   Component Value Date   CREATININE 1.08 (H) 12/03/2020   BUN 11 12/03/2020   NA 139 12/03/2020   K 4.3 12/03/2020   CL 101 12/03/2020   CO2 24 12/03/2020   Lab Results  Component Value Date   ALT 25 09/03/2020   AST 22 09/03/2020   ALKPHOS 155 (H) 09/03/2020   BILITOT 0.3 09/03/2020   Lab Results  Component Value Date   HGBA1C 10.3 (H) 12/03/2020      Imaging Studies: No results found.   Assessment:  Pleasant 70 year old female presenting to schedule screening colonoscopy.  Last colonoscopy in 2018.  Denies any GI symptoms.  No family history of colon cancer.   Plan: Colonoscopy with Dr. Abbey Chatters in the near future.  ASA 3.  I have discussed the risks, alternatives, benefits with regards to but not limited to the risk of reaction to medication, bleeding, infection, perforation and the patient is agreeable to proceed. Written consent to be obtained.

## 2020-12-04 NOTE — Progress Notes (Signed)
   Subjective:    Patient ID: [name], female    DOB: 1950-06-09, 70 y.o.   MRN: [id number]  Diabetes She presents for her follow-up diabetic visit. Current diabetic treatment includes oral agent (monotherapy) (mournjaro). She is following a diabetic diet.   Type 2 diabetes mellitus with hemoglobin A1c goal of less than 7.5% (HCC) - Plan: Hemoglobin Z3G, Basic metabolic panel  Hyperlipidemia associated with type 2 diabetes mellitus ([place]) - Plan: Lipid panel Did discuss how she is doing dietary We also went over the notes from the clinical pharmacist We also explained the medication that she is on including risk and benefits   Review of Systems     Objective:   Physical Exam  General-in no acute distress Eyes-no discharge Lungs-respiratory rate normal, CTA CV-no murmurs,RRR Extremities skin warm dry no edema Neuro grossly normal Behavior normal, alert       Assessment & Plan:   1. Type 2 diabetes mellitus with hemoglobin A1c goal of less than 7.5% (HCC) Need to get A1c under control is on the right path continue current medicines follow-up again in 3 months check labs before that visit follow through with clinical pharmacist on a regular basis - Hemoglobin U4Q - Basic metabolic panel  2. Hyperlipidemia associated with type 2 diabetes mellitus ([place]) Making progress check labs again on follow-up.  Continue statin. - Lipid panel  Morbid obesity the importance of keeping diet under control regular portion control exercise and keeping weight down discussed hopefully her GLP-1 will help

## 2020-12-04 NOTE — Progress Notes (Signed)
Primary Care Physician: Kathyrn Drown, MD  Primary Gastroenterologist:  Elon Alas. Abbey Chatters, DO   Chief Complaint  Patient presents with   Follow-up    Doing fine. No concerns. Needs to schedule TCS. Did not complete last year    HPI: [name] is a 70 y.o. female here for a follow up. She was seen in 11/2019 to schedule 10 year colonoscopy. She never scheduled procedure.   She presents wanting to schedule her colonoscopy now. Last colonoscopy in 2010, slightly tortuous colon, internal hemorrhoids. Denies constipation, diarrhea, melena, brbpr, abdominal pain, n/v, heartburn, dysphagia.      Current Outpatient Medications  Medication Sig Dispense Refill   glucose blood test strip Use as instructed to test blood glucose three time daily 100 each 12   metFORMIN (GLUCOPHAGE) 500 MG tablet Take 2 tablets (1,000 mg total) by mouth 2 (two) times daily with a meal. 360 tablet 0   rosuvastatin (CRESTOR) 40 MG tablet Take 1 tablet (40 mg total) by mouth daily. 30 tablet 11   tirzepatide (MOUNJARO) 2.5 MG/0.5ML Pen Inject 2.5 mg into the skin once a week. 2 mL 0   No current facility-administered medications for this visit.    Allergies as of 12/04/2020 - Review Complete 12/04/2020  Allergen Reaction Noted   Penicillins Itching 10/25/2016   Tramadol Itching 07/12/2018   Hydrocodone Itching 11/23/2012   Past Medical History:  Diagnosis Date   Arthritis    Essential hypertension    Finger amputation, traumatic Age 19   [place]   Hyperlipidemia    Obesity    PONV (postoperative nausea and vomiting)    Type 2 diabetes mellitus ([place])    Type 2 diabetes mellitus with diabetic neuropathy, unspecified ([place]) 02/04/2018   Past Surgical History:  Procedure Laterality Date   ABDOMINAL HYSTERECTOMY     COLONOSCOPY  12/2008   Dr. Oneida Alar: slightly tortuous colon. internal hemorrhoids. next colonoscopy in 10 years.    HERNIA REPAIR     x2   OOPHORECTOMY     REPLACEMENT TOTAL KNEE  Right    TOTAL HIP ARTHROPLASTY Left 2008?   [facility], [place]   TOTAL KNEE ARTHROPLASTY  09/09/2010   Procedure: TOTAL KNEE ARTHROPLASTY;  Surgeon: Arther Abbott, MD;  Location: [facility];  Service: Orthopedics;  Laterality: Left;  With [place]   Family History  Problem Relation Age of Onset   Alcohol abuse Father    Heart attack Mother    Other Brother        gunshot   Alcohol abuse Brother    Heart attack Sister    Breast cancer Sister    Arthritis Other    Colon cancer Neg Hx    Social History   Tobacco Use   Smoking status: Never   Smokeless tobacco: Never  Vaping Use   Vaping Use: Never used  Substance Use Topics   Alcohol use: No   Drug use: Never    ROS:  General: Negative for anorexia, weight loss, fever, chills, fatigue, weakness. ENT: Negative for hoarseness, difficulty swallowing , nasal congestion. CV: Negative for chest pain, angina, palpitations, dyspnea on exertion, peripheral edema.  Respiratory: Negative for dyspnea at rest, dyspnea on exertion, cough, sputum, wheezing.  GI: See history of present illness. GU:  Negative for dysuria, hematuria, urinary incontinence, urinary frequency, nocturnal urination.  Endo: Negative for unusual weight change.    Physical Examination:   BP 129/74   Pulse 77   Temp (!) 97  F (36.1 C)   Ht 5\' 7"  (1.702 m)   Wt 288 lb 9.6 oz (130.9 kg)   BMI 45.20 kg/m   General: Well-nourished, well-developed in no acute distress.  Eyes: No icterus. Mouth: masked Lungs: Clear to auscultation bilaterally.  Heart: Regular rate and rhythm, no murmurs rubs or gallops.  Abdomen: Bowel sounds are normal, nontender, nondistended, no hepatosplenomegaly or masses, no abdominal bruits or hernia , no rebound or guarding.   Extremities: No lower extremity edema. No clubbing or deformities. Neuro: Alert and oriented x 4   Skin: Warm and dry, no jaundice.   Psych: Alert and cooperative, normal mood and affect.  Labs:  Lab Results   Component Value Date   CREATININE 1.08 (H) 12/03/2020   BUN 11 12/03/2020   NA 139 12/03/2020   K 4.3 12/03/2020   CL 101 12/03/2020   CO2 24 12/03/2020   Lab Results  Component Value Date   ALT 25 09/03/2020   AST 22 09/03/2020   ALKPHOS 155 (H) 09/03/2020   BILITOT 0.3 09/03/2020   Lab Results  Component Value Date   HGBA1C 10.3 (H) 12/03/2020      Imaging Studies: No results found.   Assessment:  Pleasant 70 year old female presenting to schedule screening colonoscopy.  Last colonoscopy in 2018.  Denies any GI symptoms.  No family history of colon cancer.   Plan: Colonoscopy with Dr. Abbey Chatters in the near future.  ASA 3.  I have discussed the risks, alternatives, benefits with regards to but not limited to the risk of reaction to medication, bleeding, infection, perforation and the patient is agreeable to proceed. Written consent to be obtained.

## 2020-12-04 NOTE — Patient Instructions (Signed)
Colonoscopy as scheduled. See separate instructions.  

## 2020-12-07 ENCOUNTER — Other Ambulatory Visit: Payer: Self-pay | Admitting: Internal Medicine

## 2020-12-10 ENCOUNTER — Telehealth: Payer: Self-pay | Admitting: Internal Medicine

## 2020-12-10 NOTE — Telephone Encounter (Signed)
Please send patient prep to her pharmacy again, they are telling her that they did not receive anything

## 2020-12-11 NOTE — Telephone Encounter (Signed)
Routing to [place] to send in prep again

## 2020-12-11 NOTE — Telephone Encounter (Signed)
Called pt, she received a package and opened it while I was on phone. She received prep.

## 2020-12-12 DIAGNOSIS — E1169 Type 2 diabetes mellitus with other specified complication: Secondary | ICD-10-CM

## 2020-12-12 DIAGNOSIS — E785 Hyperlipidemia, unspecified: Secondary | ICD-10-CM

## 2020-12-12 DIAGNOSIS — I10 Essential (primary) hypertension: Secondary | ICD-10-CM | POA: Diagnosis not present

## 2020-12-12 DIAGNOSIS — E1165 Type 2 diabetes mellitus with hyperglycemia: Secondary | ICD-10-CM | POA: Diagnosis not present

## 2020-12-17 ENCOUNTER — Telehealth: Payer: Self-pay | Admitting: Family Medicine

## 2020-12-17 DIAGNOSIS — E119 Type 2 diabetes mellitus without complications: Secondary | ICD-10-CM

## 2020-12-17 MED ORDER — TIRZEPATIDE 5 MG/0.5ML ~~LOC~~ SOAJ: SUBCUTANEOUS | 2 refills | Status: DC

## 2020-12-17 NOTE — Telephone Encounter (Signed)
Patient requesting a refill she is out and needs this for tomorrow. She is now using [facility] in [place].  CB # [phone]  tirzepatide [facility]) 2.5 MG/0.5ML Pen [[id number]]    Order Details Dose: 2.5 mg Route: Subcutaneous Frequency: Weekly  Dispense Quantity: 2 mL Refills: 0        Sig: Inject 2.5 mg into the skin once a week.  Patient taking differently: Inject 2.5 mg into the skin every Tuesday.

## 2020-12-17 NOTE — Telephone Encounter (Signed)
Please advise. Thank you

## 2020-12-17 NOTE — Telephone Encounter (Signed)
If she is tolerating this well the neck step is to go to 5 mg It would be 5 mg subcutaneous once a week 1 month of prescription with 2 refills, keep follow-up visits with clinical pharmacist and with myself notify us if any problems

## 2020-12-17 NOTE — Telephone Encounter (Signed)
Pt contacted. Pt states she is doing well on current dose. 5 mg sent to pharmacy. Pt verbalized understanding.

## 2020-12-19 ENCOUNTER — Other Ambulatory Visit: Payer: Self-pay

## 2020-12-19 ENCOUNTER — Encounter (HOSPITAL_COMMUNITY): Payer: Self-pay

## 2020-12-19 NOTE — Patient Instructions (Addendum)
[name]  12/19/2020     @PREFPERIOPPHARMACY @   Your procedure is scheduled on  12/24/2020.   Report to [facility] at  [phone]  A.M.   Call this number if you have problems the morning of surgery:  [phone]   Remember:  Follow the diet and prep instructions given to you by the office.      DO NOT take any medications for diabetes the morning of your procedure.    Take these medicines the morning of surgery with A SIP OF WATER                                                  None     Do not wear jewelry, make-up or nail polish.  Do not wear lotions, powders, or perfumes, or deodorant.  Do not shave 48 hours prior to surgery.  Men may shave face and neck.  Do not bring valuables to the hospital.  [facility] is not responsible for any belongings or valuables.  Contacts, dentures or bridgework may not be worn into surgery.  Leave your suitcase in the car.  After surgery it may be brought to your room.  For patients admitted to the hospital, discharge time will be determined by your treatment team.  Patients discharged the day of surgery will not be allowed to drive home and must have someone with them for 24 hours.    Special instructions:   DO NOT smoke tobacco or vape for 24 hours before your procedure.  Please read over the following fact sheets that you were given. Anesthesia Post-op Instructions and Care and Recovery After Surgery      Colonoscopy, Adult, Care After This sheet gives you information about how to care for yourself after your procedure. Your health care provider may also give you more specific instructions. If you have problems or questions, contact your health care provider. What can I expect after the procedure? After the procedure, it is common to have: A small amount of blood in your stool for 24 hours after the procedure. Some gas. Mild cramping or bloating of your abdomen. Follow these instructions at home: Eating and  drinking  Drink enough fluid to keep your urine pale yellow. Follow instructions from your health care provider about eating or drinking restrictions. Resume your normal diet as instructed by your health care provider. Avoid heavy or fried foods that are hard to digest. Activity Rest as told by your health care provider. Avoid sitting for a long time without moving. Get up to take short walks every 1-2 hours. This is important to improve blood flow and breathing. Ask for help if you feel weak or unsteady. Return to your normal activities as told by your health care provider. Ask your health care provider what activities are safe for you. Managing cramping and bloating  Try walking around when you have cramps or feel bloated. Apply heat to your abdomen as told by your health care provider. Use the heat source that your health care provider recommends, such as a moist heat pack or a heating pad. Place a towel between your skin and the heat source. Leave the heat on for 20-30 minutes. Remove the heat if your skin turns bright red. This is especially important if you are unable to feel pain, heat,  or cold. You may have a greater risk of getting burned. General instructions If you were given a sedative during the procedure, it can affect you for several hours. Do not drive or operate machinery until your health care provider says that it is safe. For the first 24 hours after the procedure: Do not sign important documents. Do not drink alcohol. Do your regular daily activities at a slower pace than normal. Eat soft foods that are easy to digest. Take over-the-counter and prescription medicines only as told by your health care provider. Keep all follow-up visits as told by your health care provider. This is important. Contact a health care provider if: You have blood in your stool 2-3 days after the procedure. Get help right away if you have: More than a small spotting of blood in your  stool. Large blood clots in your stool. Swelling of your abdomen. Nausea or vomiting. A fever. Increasing pain in your abdomen that is not relieved with medicine. Summary After the procedure, it is common to have a small amount of blood in your stool. You may also have mild cramping and bloating of your abdomen. If you were given a sedative during the procedure, it can affect you for several hours. Do not drive or operate machinery until your health care provider says that it is safe. Get help right away if you have a lot of blood in your stool, nausea or vomiting, a fever, or increased pain in your abdomen. This information is not intended to replace advice given to you by your health care provider. Make sure you discuss any questions you have with your health care provider. Document Revised: 11/05/2018 Document Reviewed: 07/26/2018 Elsevier Patient Education  [place] After This sheet gives you information about how to care for yourself after your procedure. Your health care provider may also give you more specific instructions. If you have problems or questions, contact your health care provider. What can I expect after the procedure? After the procedure, it is common to have: Tiredness. Forgetfulness about what happened after the procedure. Impaired judgment for important decisions. Nausea or vomiting. Some difficulty with balance. Follow these instructions at home: For the time period you were told by your health care provider:   Rest as needed. Do not participate in activities where you could fall or become injured. Do not drive or use machinery. Do not drink alcohol. Do not take sleeping pills or medicines that cause drowsiness. Do not make important decisions or sign legal documents. Do not take care of children on your own. Eating and drinking Follow the diet that is recommended by your health care provider. Drink enough fluid to  keep your urine pale yellow. If you vomit: Drink water, juice, or soup when you can drink without vomiting. Make sure you have little or no nausea before eating solid foods. General instructions Have a responsible adult stay with you for the time you are told. It is important to have someone help care for you until you are awake and alert. Take over-the-counter and prescription medicines only as told by your health care provider. If you have sleep apnea, surgery and certain medicines can increase your risk for breathing problems. Follow instructions from your health care provider about wearing your sleep device: Anytime you are sleeping, including during daytime naps. While taking prescription pain medicines, sleeping medicines, or medicines that make you drowsy. Avoid smoking. Keep all follow-up visits as told by your health care provider.  This is important. Contact a health care provider if: You keep feeling nauseous or you keep vomiting. You feel light-headed. You are still sleepy or having trouble with balance after 24 hours. You develop a rash. You have a fever. You have redness or swelling around the IV site. Get help right away if: You have trouble breathing. You have new-onset confusion at home. Summary For several hours after your procedure, you may feel tired. You may also be forgetful and have poor judgment. Have a responsible adult stay with you for the time you are told. It is important to have someone help care for you until you are awake and alert. Rest as told. Do not drive or operate machinery. Do not drink alcohol or take sleeping pills. Get help right away if you have trouble breathing, or if you suddenly become confused. This information is not intended to replace advice given to you by your health care provider. Make sure you discuss any questions you have with your health care provider. Document Revised: 09/15/2019 Document Reviewed: 12/02/2018 Elsevier Patient  Education  2022 [employer].

## 2020-12-20 ENCOUNTER — Encounter (HOSPITAL_COMMUNITY)
Admission: RE | Admit: 2020-12-20 | Discharge: 2020-12-20 | Disposition: A | Discharge status: Home / Self Care | Payer: Medicare HMO | Source: Ambulatory Visit | Attending: Internal Medicine | Admitting: Internal Medicine

## 2020-12-24 ENCOUNTER — Other Ambulatory Visit: Payer: Self-pay

## 2020-12-24 ENCOUNTER — Ambulatory Visit (HOSPITAL_COMMUNITY): Payer: Medicare HMO | Admitting: Anesthesiology

## 2020-12-24 ENCOUNTER — Encounter (HOSPITAL_COMMUNITY): Payer: Self-pay

## 2020-12-24 ENCOUNTER — Encounter (HOSPITAL_COMMUNITY)
Admission: RE | Disposition: A | Discharge status: Home / Self Care | Payer: Self-pay | Source: Home / Self Care | Attending: Internal Medicine

## 2020-12-24 ENCOUNTER — Ambulatory Visit (HOSPITAL_COMMUNITY)
Admission: RE | Admit: 2020-12-24 | Discharge: 2020-12-24 | Disposition: A | Discharge status: Home / Self Care | Payer: Medicare HMO | Attending: Internal Medicine | Admitting: Internal Medicine

## 2020-12-24 ENCOUNTER — Other Ambulatory Visit: Payer: Self-pay | Admitting: Family Medicine

## 2020-12-24 DIAGNOSIS — K648 Other hemorrhoids: Secondary | ICD-10-CM | POA: Diagnosis not present

## 2020-12-24 DIAGNOSIS — E1140 Type 2 diabetes mellitus with diabetic neuropathy, unspecified: Secondary | ICD-10-CM | POA: Diagnosis not present

## 2020-12-24 DIAGNOSIS — Z6841 Body Mass Index (BMI) 40.0 and over, adult: Secondary | ICD-10-CM | POA: Diagnosis not present

## 2020-12-24 DIAGNOSIS — I10 Essential (primary) hypertension: Secondary | ICD-10-CM | POA: Insufficient documentation

## 2020-12-24 DIAGNOSIS — Z7984 Long term (current) use of oral hypoglycemic drugs: Secondary | ICD-10-CM | POA: Diagnosis not present

## 2020-12-24 DIAGNOSIS — Z1211 Encounter for screening for malignant neoplasm of colon: Secondary | ICD-10-CM | POA: Diagnosis not present

## 2020-12-24 DIAGNOSIS — D124 Benign neoplasm of descending colon: Secondary | ICD-10-CM

## 2020-12-24 DIAGNOSIS — K635 Polyp of colon: Secondary | ICD-10-CM | POA: Diagnosis not present

## 2020-12-24 DIAGNOSIS — E1165 Type 2 diabetes mellitus with hyperglycemia: Secondary | ICD-10-CM | POA: Diagnosis not present

## 2020-12-24 HISTORY — PX: POLYPECTOMY: SHX5525

## 2020-12-24 HISTORY — PX: COLONOSCOPY WITH PROPOFOL: SHX5780

## 2020-12-24 LAB — GLUCOSE, CAPILLARY: Glucose-Capillary: 146 mg/dL — ABNORMAL HIGH (ref 70–99)

## 2020-12-24 SURGERY — COLONOSCOPY WITH PROPOFOL
Anesthesia: General

## 2020-12-24 MED ORDER — PROPOFOL 10 MG/ML IV BOLUS
INTRAVENOUS | Status: DC | PRN
  Administered 2020-12-24: 30 mg via INTRAVENOUS
  Administered 2020-12-24: 50 mg via INTRAVENOUS
  Administered 2020-12-24 (×3): 30 mg via INTRAVENOUS

## 2020-12-24 MED ORDER — LIDOCAINE HCL (CARDIAC) PF 100 MG/5ML IV SOSY
PREFILLED_SYRINGE | INTRAVENOUS | Status: DC | PRN
  Administered 2020-12-24: 60 mg via INTRAVENOUS

## 2020-12-24 MED ORDER — LACTATED RINGERS IV SOLN: INTRAVENOUS | Status: DC

## 2020-12-24 NOTE — Op Note (Signed)
[facility] Patient Name: [name] Procedure Date: 12/24/2020 8:25 AM MRN: [id number] Date of Birth: Nov 09, 1950 Attending MD: Elon Alas. Edgar Frisk CSN: [id number] Age: 70 Admit Type: Outpatient Procedure:                Colonoscopy Indications:              Screening for colorectal malignant neoplasm Providers:                Elon Alas. Abbey Chatters, DO, Lambert Mody, Dereck Leep, Technician Referring MD:              Medicines:                See the Anesthesia note for documentation of the                            administered medications Complications:            No immediate complications. Estimated Blood Loss:     Estimated blood loss was minimal. Procedure:                Pre-Anesthesia Assessment:                           - The anesthesia plan was to use monitored                            anesthesia care (MAC).                           After obtaining informed consent, the colonoscope                            was passed under direct vision. Throughout the                            procedure, the patient's blood pressure, pulse, and                            oxygen saturations were monitored continuously. The                            [id number]-HQ190L ([id number]) scope was introduced through                            the anus and advanced to the the terminal ileum,                            with identification of the appendiceal orifice and                            IC valve. The colonoscopy was performed without                            difficulty. The patient tolerated  the procedure                            well. The quality of the bowel preparation was                            evaluated using the BBPS [facility] Bowel Preparation                            Scale) with scores of: Right Colon = 3, Transverse                            Colon = 3 and Left Colon = 3 (entire mucosa seen                            well with  no residual staining, small fragments of                            stool or opaque liquid). The total BBPS score                            equals 9. Scope In: 8:48:50 AM Scope Out: 9:05:59 AM Scope Withdrawal Time: 0 hours 12 minutes 16 seconds  Total Procedure Duration: 0 hours 17 minutes 9 seconds  Findings:      The perianal and digital rectal examinations were normal.      Non-bleeding internal hemorrhoids were found during endoscopy.      A 4 mm polyp was found in the descending colon. The polyp was sessile.       The polyp was removed with a cold snare. Resection and retrieval were       complete. Impression:               - Non-bleeding internal hemorrhoids.                           - One 4 mm polyp in the descending colon, removed                            with a cold snare. Resected and retrieved. Moderate Sedation:      Per Anesthesia Care Recommendation:           - Patient has a contact number available for                            emergencies. The signs and symptoms of potential                            delayed complications were discussed with the                            patient. Return to normal activities tomorrow.                            Written discharge instructions were provided to the  patient.                           - Resume previous diet.                           - Continue present medications.                           - Await pathology results.                           - Repeat colonoscopy in 5 years for surveillance.                           - Return to GI clinic PRN. Procedure Code(s):        --- Professional ---                           [phone], Colonoscopy, flexible; with removal of                            tumor(s), polyp(s), or other lesion(s) by snare                            technique Diagnosis Code(s):        --- Professional ---                           Z12.11, Encounter for screening for malignant                             neoplasm of colon                           K63.5, Polyp of colon                           K64.8, Other hemorrhoids CPT copyright 2019 American Medical Association. All rights reserved. The codes documented in this report are preliminary and upon coder review may  be revised to meet current compliance requirements. Elon Alas. Abbey Chatters, DO [place] Abbey Chatters, DO 12/24/2020 9:11:34 AM This report has been signed electronically. Number of Addenda: 0

## 2020-12-24 NOTE — Anesthesia Preprocedure Evaluation (Addendum)
Anesthesia Evaluation  Patient identified by MRN, date of birth, ID band Patient awake    Reviewed: Allergy & Precautions, NPO status , Patient's Chart, lab work & pertinent test results  History of Anesthesia Complications (+) PONV and history of anesthetic complications  Airway Mallampati: II  TM Distance: >3 FB Neck ROM: Full    Dental  (+) Dental Advisory Given, Missing   Pulmonary    Pulmonary exam normal breath sounds clear to auscultation       Cardiovascular Exercise Tolerance: Good hypertension, Normal cardiovascular exam Rhythm:Regular Rate:Normal     Neuro/Psych  Headaches,  Neuromuscular disease negative psych ROS   GI/Hepatic negative GI ROS, Neg liver ROS,   Endo/Other  diabetes, Well Controlled, Type 2, Oral Hypoglycemic AgentsMorbid obesity  Renal/GU negative Renal ROS     Musculoskeletal  (+) Arthritis , Osteoarthritis,    Abdominal   Peds  Hematology   Anesthesia Other Findings   Reproductive/Obstetrics negative OB ROS                            Anesthesia Physical Anesthesia Plan  ASA: 3  Anesthesia Plan: General   Post-op Pain Management:    Induction:   PONV Risk Score and Plan: TIVA  Airway Management Planned: Nasal Cannula and Natural Airway  Additional Equipment:   Intra-op Plan:   Post-operative Plan:   Informed Consent: I have reviewed the patients History and Physical, chart, labs and discussed the procedure including the risks, benefits and alternatives for the proposed anesthesia with the patient or authorized representative who has indicated his/her understanding and acceptance.     Dental advisory given  Plan Discussed with: CRNA and Surgeon  Anesthesia Plan Comments:        Anesthesia Quick Evaluation

## 2020-12-24 NOTE — Anesthesia Postprocedure Evaluation (Signed)
Anesthesia Post Note  Patient: [name]  Procedure(s) Performed: COLONOSCOPY WITH PROPOFOL POLYPECTOMY  Patient location during evaluation: Phase II Anesthesia Type: General Level of consciousness: awake and alert and oriented Pain management: pain level controlled Vital Signs Assessment: post-procedure vital signs reviewed and stable Respiratory status: spontaneous breathing, nonlabored ventilation and respiratory function stable Cardiovascular status: blood pressure returned to baseline and stable Postop Assessment: no apparent nausea or vomiting Anesthetic complications: no   No notable events documented.   Last Vitals:  Vitals:   12/24/20 0923 12/24/20 0926  BP: (!) 96/58 (!) 113/56  Pulse: 68   Resp: 16   Temp: 37.4 C   SpO2: 97%     Last Pain:  Vitals:   12/24/20 0923  TempSrc: Oral  PainSc:                  Rosiland Sen C Torie Priebe

## 2020-12-24 NOTE — Interval H&P Note (Signed)
History and Physical Interval Note:  12/24/2020 8:27 AM  [name]  has presented today for surgery, with the diagnosis of screening colonoscopy.  The various methods of treatment have been discussed with the patient and family. After consideration of risks, benefits and other options for treatment, the patient has consented to  Procedure(s) with comments: COLONOSCOPY WITH PROPOFOL (N/A) - 8:00am as a surgical intervention.  The patient's history has been reviewed, patient examined, no change in status, stable for surgery.  I have reviewed the patient's chart and labs.  Questions were answered to the patient's satisfaction.     Eloise Harman

## 2020-12-24 NOTE — Transfer of Care (Signed)
Immediate Anesthesia Transfer of Care Note  Patient: Acadia M Shankles  Procedure(s) Performed: COLONOSCOPY WITH PROPOFOL POLYPECTOMY  Patient Location: PACU  Anesthesia Type:MAC  Level of Consciousness: awake, alert  and oriented  Airway & Oxygen Therapy: Patient Spontanous Breathing  Post-op Assessment: Report given to RN and Post -op Vital signs reviewed and stable  Post vital signs: Reviewed and stable  Last Vitals:  Vitals Value Taken Time  BP 96/58 12/24/20 0923  Temp 37.4 C 12/24/20 0923  Pulse 68 12/24/20 0923  Resp 16 12/24/20 0923  SpO2 97 % 12/24/20 0923    Last Pain:  Vitals:   12/24/20 0923  TempSrc: Oral  PainSc:          Complications: No notable events documented.

## 2020-12-24 NOTE — Discharge Instructions (Signed)
  Colonoscopy Discharge Instructions  Read the instructions outlined below and refer to this sheet in the next few weeks. These discharge instructions provide you with general information on caring for yourself after you leave the hospital. Your doctor may also give you specific instructions. While your treatment has been planned according to the most current medical practices available, unavoidable complications occasionally occur.   ACTIVITY You may resume your regular activity, but move at a slower pace for the next 24 hours.  Take frequent rest periods for the next 24 hours.  Walking will help get rid of the air and reduce the bloated feeling in your belly (abdomen).  No driving for 24 hours (because of the medicine (anesthesia) used during the test).   Do not sign any important legal documents or operate any machinery for 24 hours (because of the anesthesia used during the test).  NUTRITION Drink plenty of fluids.  You may resume your normal diet as instructed by your doctor.  Begin with a light meal and progress to your normal diet. Heavy or fried foods are harder to digest and may make you feel sick to your stomach (nauseated).  Avoid alcoholic beverages for 24 hours or as instructed.  MEDICATIONS You may resume your normal medications unless your doctor tells you otherwise.  WHAT YOU CAN EXPECT TODAY Some feelings of bloating in the abdomen.  Passage of more gas than usual.  Spotting of blood in your stool or on the toilet paper.  IF YOU HAD POLYPS REMOVED DURING THE COLONOSCOPY: No aspirin products for 7 days or as instructed.  No alcohol for 7 days or as instructed.  Eat a soft diet for the next 24 hours.  FINDING OUT THE RESULTS OF YOUR TEST Not all test results are available during your visit. If your test results are not back during the visit, make an appointment with your caregiver to find out the results. Do not assume everything is normal if you have not heard from your  caregiver or the medical facility. It is important for you to follow up on all of your test results.  SEEK IMMEDIATE MEDICAL ATTENTION IF: You have more than a spotting of blood in your stool.  Your belly is swollen (abdominal distention).  You are nauseated or vomiting.  You have a temperature over 101.  You have abdominal pain or discomfort that is severe or gets worse throughout the day.   Your colonoscopy revealed 1 polyp(s) which I removed successfully. Await pathology results, my office will contact you. I recommend repeating colonoscopy in 5 years for surveillance purposes. Otherwise follow up with GI as needed.    I hope you have a great rest of your week!  Esabella Stockinger K. Laportia Carley, D.O. Gastroenterology and Hepatology [facility]  

## 2020-12-25 LAB — SURGICAL PATHOLOGY

## 2020-12-26 ENCOUNTER — Encounter (HOSPITAL_COMMUNITY): Payer: Self-pay | Admitting: Internal Medicine

## 2020-12-31 ENCOUNTER — Ambulatory Visit (INDEPENDENT_AMBULATORY_CARE_PROVIDER_SITE_OTHER): Payer: Medicare HMO | Admitting: Pharmacist

## 2020-12-31 ENCOUNTER — Other Ambulatory Visit: Payer: Self-pay

## 2020-12-31 DIAGNOSIS — E1165 Type 2 diabetes mellitus with hyperglycemia: Secondary | ICD-10-CM

## 2020-12-31 DIAGNOSIS — E785 Hyperlipidemia, unspecified: Secondary | ICD-10-CM

## 2020-12-31 DIAGNOSIS — I10 Essential (primary) hypertension: Secondary | ICD-10-CM

## 2020-12-31 NOTE — Patient Instructions (Signed)
Rosalynd DAMILOLA FLAMM,  It was great to talk to you today!  Please call me with any questions or concerns.   Visit Information  Following are the goals we discussed today:   Patient Goals/Self-Care Activities Patient will:  Take medications as prescribed Check blood sugar three times a day at the following times: fasting (at least 8 hours since last food consumption), 5-15 minutes before lunch, bedtime, and whenever patient experiences symptoms of hypo/hyperglycemia, document, and provide at future appointments Collaborate with provider on medication access solutions Engage in dietary modifications by less frequent dining out, decreased fat intake, and fewer sweetened foods & beverages  Plan: Telephone follow up appointment with care management team member scheduled for:  01/28/21  Kennon Holter, PharmD, BCACP, CPP Clinical Pharmacist Practitioner [place] [phone]  Please call the care guide team at [phone] if you need to cancel or reschedule your appointment.   The patient verbalized understanding of instructions, educational materials, and care plan provided today and declined offer to receive copy of patient instructions, educational materials, and care plan.

## 2020-12-31 NOTE — Chronic Care Management (AMB) (Signed)
Chronic Care Management Pharmacy Note  12/31/2020 Name:  [name] MRN:  [id number] DOB:  12-31-1950  Summary: Type 2 Diabetes Uncontrolled but improving; Most recent A1c above goal of <7% per ADA guidelines Current medications: metformin 1,000 mg by mouth twice daily and tirzepatide (Mounjaro) 5 mg subcutaneously once weekly Mounjaro recently on hold for colonoscopy and now patient states she is unable to get Mounjaro 5 mg since the pharmacy is out of stock and it is on backorder. This has been confirmed with FDA drug shortage website Review of blood glucose meter and log today demonstrated stable blood glucose over last few weeks with slight increase in blood glucose which is likely due to not having Mounjaro over last 3-4 weeks. Average blood glucose over last 7 days has been 171, last 14 days 163, last 30 days 151, last 90 days 188  Continue metformin 1,000 mg by mouth twice daily Continue tirzepatide (Mounjaro) 5 mg subcutaneously once weekly as able to get from pharmacy during drug shortage. Multiple pharmacies called and all unable to get Mounjaro 5 mg, 7.5 mg, and 10 mg right now. Continue to reduce intake of carbohydrates through diet.   Hyperlipidemia Re-check lipid panel before next PCP appointment in March 2023. Expect lipid profile to be improved.  Continue rosuvastatin 40 mg by mouth daily If LDL remains above goal on high intensity statin then will recommend addition of ezetimibe 10 mg by mouth daily.   Overweight/Obesity Continue to titrate Mounjaro as able to tolerate. Continue as able to get from pharmacy during drug shortage.   Subjective: [name] is an 70 y.o. year old female who is a primary patient of Luking, Elayne Snare, MD.  The CCM team was consulted for assistance with disease management and care coordination needs.    Engaged with patient face to face for follow up visit in response to provider referral for pharmacy case management and/or care  coordination services.   Consent to Services:  The patient was given information about Chronic Care Management services, agreed to services, and gave verbal consent prior to initiation of services.  Please see initial visit note for detailed documentation.   Patient Care Team: Kathyrn Drown, MD as PCP - General (Family Medicine) Satira Sark, MD as PCP - Cardiology (Cardiology) Pieter Partridge, DO as Consulting Physician (Neurology) Eloise Harman, DO as Consulting Physician (Internal Medicine) Beryle Lathe, [facility] (Pharmacist)  Objective:  Lab Results  Component Value Date   CREATININE 1.08 (H) 12/03/2020   CREATININE 0.95 09/03/2020   CREATININE 0.97 10/24/2019    Lab Results  Component Value Date   HGBA1C 10.3 (H) 12/03/2020   Last diabetic Eye exam:  Lab Results  Component Value Date/Time   HMDIABEYEEXA No Retinopathy 10/17/2020 12:00 AM    Last diabetic Foot exam: No results found for: HMDIABFOOTEX      Component Value Date/Time   CHOL 341 (H) 09/03/2020 1016   TRIG 190 (H) 09/03/2020 1016   HDL 47 09/03/2020 1016   CHOLHDL 7.3 (H) 09/03/2020 1016   CHOLHDL 4.6 12/27/2013 0710   VLDL 22 12/27/2013 0710   LDLCALC 256 (H) 09/03/2020 1016    Hepatic Function Latest Ref Rng & Units 09/03/2020 10/24/2019 11/18/2018  Total Protein 6.0 - 8.5 g/dL 7.8 7.9 7.8  Albumin 3.8 - 4.8 g/dL 4.0 4.1 4.2  AST 0 - 40 IU/L 22 17 14   ALT 0 - 32 IU/L 25 21 15   Alk Phosphatase 44 -  121 IU/L 155(H) 182(H) 195(H)  Total Bilirubin 0.0 - 1.2 mg/dL 0.3 0.3 0.3  Bilirubin, Direct 0.00 - 0.40 mg/dL <0.10 - 0.08    Lab Results  Component Value Date/Time   TSH 1.550 09/03/2020 10:16 AM   FREET4 1.15 09/03/2020 10:16 AM    CBC Latest Ref Rng & Units 04/09/2015 09/14/2013 09/12/2010  WBC 3.4 - 10.8 x10E3/uL 7.7 10.0 10.5  Hemoglobin 11.1 - 15.9 g/dL 12.7 14.1 10.4(L)  Hematocrit 34.0 - 46.6 % 38.8 41.8 31.3(L)  Platelets 150 - 379 x10E3/uL 257 PLATELET CLUMPS NOTED ON  SMEAR, COUNT APPEARS ADEQUATE 229    No results found for: VD25OH  Clinical ASCVD: No  The ASCVD Risk score (Arnett DK, et al., 2019) failed to calculate for the following reasons:   The valid total cholesterol range is 130 to 320 mg/dL    Social History   Tobacco Use  Smoking Status Never  Smokeless Tobacco Never   BP Readings from Last 3 Encounters:  12/24/20 (!) 113/56  12/04/20 128/72  12/04/20 129/74   Pulse Readings from Last 3 Encounters:  12/24/20 68  12/04/20 88  12/04/20 77   Wt Readings from Last 3 Encounters:  12/19/20 288 lb 6.4 oz (130.8 kg)  12/04/20 288 lb 6.4 oz (130.8 kg)  12/04/20 288 lb 9.6 oz (130.9 kg)    Assessment: Review of patient past medical history, allergies, medications, health status, including review of consultants reports, laboratory and other test data, was performed as part of comprehensive evaluation and provision of chronic care management services.   SDOH:  (Social Determinants of Health) assessments and interventions performed:    CCM Care Plan  Allergies  Allergen Reactions   Penicillins Itching    Has patient had a PCN reaction causing immediate rash, facial/tongue/throat swelling, SOB or lightheadedness with hypotension: No Has patient had a PCN reaction causing severe rash involving mucus membranes or skin necrosis: No Has patient had a PCN reaction that required hospitalization: No Has patient had a PCN reaction occurring within the last 10 years: No If all of the above answers are "NO", then may proceed with Cephalosporin use.    Tramadol Itching   Hydrocodone Itching    Medications Reviewed Today     Reviewed by Beryle Lathe, [facility] (Pharmacist) on 12/31/20 at [place] List Status: <None>   Medication Order Taking? Sig Documenting Provider Last Dose Status Informant  Accu-Chek Softclix Lancets lancets [id number] Yes  [provider]  Active   Blood Glucose Monitoring Suppl (ACCU-CHEK GUIDE ME)  w/Device KIT [id number] Yes  [provider]  Active   glucose blood test strip [id number]  Use as instructed to test blood glucose three time daily Sallee Lange A, MD  Active Self  metFORMIN (GLUCOPHAGE) 500 MG tablet [id number] Yes TAKE 2 TABLETS TWICE DAILY WITH MEALS Luking, Elayne Snare, MD Taking Active   rosuvastatin (CRESTOR) 40 MG tablet [id number] Yes Take 1 tablet (40 mg total) by mouth daily. Kathyrn Drown, MD Taking Active Self  tirzepatide [facility]) 5 MG/0.5ML Pen [id number] No Inject 5 mg once weekly into skin  Patient not taking: Reported on 12/31/2020   Kathyrn Drown, MD Not Taking Active            Med Note Alric Seton Dec 19, 2020  1:51 PM) On hold for procedure. Lat dose was over 2 weeks ago.            Patient Active Problem List  Diagnosis Date Noted   Vaginal itching 10/08/2020   Yeast vaginitis 10/08/2020   Encounter for screening colonoscopy 12/13/2019   Occipital neuralgia of left side 06/24/2019   Chronic intractable headache 06/24/2019   Uncontrolled type 2 diabetes mellitus with hyperglycemia ([place]) 01/19/2019   S/P total knee replacement, right 2007 01/12/2019   Special screening for malignant neoplasms, colon 12/22/2018   Type 2 diabetes mellitus with diabetic neuropathy, unspecified ([place]) 02/04/2018   Carpal tunnel syndrome 05/16/2015   Morbid obesity ([place]) 03/13/2015   Elevated alkaline phosphatase level 09/15/2013   HTN (hypertension) 09/14/2013   Type 2 diabetes mellitus with hemoglobin A1c goal of less than 7.5% ([place]) 10/04/2012   Lumbago with sciatica of left side 10/22/2010   Difficulty in walking(719.7) 10/16/2010   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 09/23/2010   Acquired trigger finger 08/15/2010   OA (osteoarthritis) of knee 08/15/2010   KNEE PAIN 05/26/2007   S/P hip replacement, left 05/26/2007   DEGENERATIVE JOINT DISEASE, LEFT HIP 12/03/2006    Immunization History  Administered Date(s) Administered   Fluad  Quad(high Dose 65+) 10/24/2019, 10/31/2020   Influenza Split 10/04/2012, 09/13/2013   Influenza,inj,Quad PF,6+ Mos 09/22/2014, 10/05/2015, 11/04/2017   Influenza-Unspecified 09/14/2018   Moderna Sars-Covid-2 Vaccination 03/15/2019, 04/12/2019   Pneumococcal Conjugate-13 04/24/2016   Pneumococcal Polysaccharide-23 06/14/2013, 10/24/2019   Td 06/14/2013   Zoster Recombinat (Shingrix) 02/03/2018    Conditions to be addressed/monitored: HTN, HLD, DMII, and weight management  Care Plan : Medication Management  Updates made by Beryle Lathe, [place] since 12/31/2020 12:00 AM     Problem: T2DM, HTN, HLD, Weight Management   Priority: High  Onset Date: 11/12/2020     Long-Range Goal: Disease Progression Prevention   Start Date: 11/12/2020  Expected End Date: 02/10/2021  Recent Progress: On track  Priority: High  Note:   Current Barriers:  Unable to achieve control of diabetes and hyperlipidemia  Pharmacist Clinical Goal(s):  Patient will Achieve control of diabetes and hyperlipidemia as evidenced by improved fasting blood sugar, improved A1c, improved LDL, and improved triglycerides through collaboration with PharmD and provider.   Interventions: 1:1 collaboration with Kathyrn Drown, MD regarding development and update of comprehensive plan of care as evidenced by provider attestation and co-signature Inter-disciplinary care team collaboration (see longitudinal plan of care) Comprehensive medication review performed; medication list updated in electronic medical record  Type 2 Diabetes - Goal on Track (progressing): YES. Uncontrolled but improving; Most recent A1c above goal of <7% per ADA guidelines Current medications: metformin 1,000 mg by mouth twice daily and tirzepatide (Mounjaro) 5 mg subcutaneously once weekly Intolerances: none Taking medications as directed: no, Mounjaro recently on hold for colonoscopy and now patient states she is unable to get Mounjaro 5 mg  since the pharmacy is out of stock and it is on backorder. This has been confirmed with FDA drug shortage website Side effects thought to be attributed to current medication regimen: no Hypoglycemia prevention: not indicated at this time Current meal patterns:  follows with [name] dietician;  has cut out soda, juice, sweet tea, and carb intake from foods such as sweets, rice, potatoes, and breads Current exercise:  does some home workouts a few times per week On a statin: yes On aspirin 81 mg daily: no Last microalbumin: <6; on an ACEi/ARB: no Last eye exam: completed within last year. Next appointment 01/03/21 per patient to discuss cataracts. Last foot exam: completed within last year Current glucose readings:  Review of blood glucose meter  and log today demonstrated stable blood glucose over last few weeks with slight increase in blood glucose which is likely due to not having Mounjaro over last 3-4 weeks. Average blood glucose over last 7 days has been 171, last 14 days 163, last 30 days 151, last 90 days 188  Continue metformin 1,000 mg by mouth twice daily Continue tirzepatide (Mounjaro) 5 mg subcutaneously once weekly as able to get from pharmacy during drug shortage.  Continue to reduce intake of carbohydrates through diet.  Instructed to monitor blood sugars three times a day at the following times: fasting (at least 8 hours since last food consumption), 5-15 minutes before lunch, bedtime, and whenever patient experiences symptoms of hypo/hyperglycemia  Patient was instructed on treatment of hypoglycemia. If you have a low blood sugar less than 70, please eat 15 grams of carbohydrates (4 oz of juice, soda, 4 glucose tablets, or 3-4 pieces of hard candy). Wait 15 minutes and then recheck your blood sugar. If your sugar is still less than 70, eat another 15 grams of carbohydrates. Wait another 15 minutes and recheck your glucose. Continue this until your sugar is over 70. Then, eat a  snack with protein in it to prevent your sugar from dropping again. Patient was previously instructed on appropriate self administration technique for injection of Mounjaro  Hypertension - Goal Met.: Blood pressure under good control. Blood pressure is at goal of <130/80 mmHg per 2017 AHA/ACC guidelines. Current medications:  none Current home blood pressure: not discussed today Blood pressure medications not indicated at present Continue to monitor blood pressure  Hyperlipidemia - Goal on Track (progressing): YES.: Uncontrolled. LDL above goal of <70 due to very high risk given 10-year risk >20% per 2020 AACE/ACE guidelines. Triglycerides above goal of <150 per 2020 AACE/ACE guidelines. Current medications: rosuvastatin 40 mg by mouth once daily Intolerances: none Taking medications as directed: yes Side effects thought to be attributed to current medication regimen: no Encourage dietary reduction of high fat containing foods such as butter, nuts, bacon, egg yolks, etc. Discussed need for and importance of continued work on weight loss Reviewed risks of hyperlipidemia, principles of treatment and consequences of untreated hyperlipidemia Discussed need for medication compliance Re-check lipid panel before next PCP appointment in March 2023. Expect lipid profile to be improved.  Continue rosuvastatin 40 mg by mouth daily If LDL remains above goal on high intensity statin then will recommend addition of ezetimibe 10 mg by mouth daily.   Overweight/Obesity - Goal on Track (progressing): YES.: Unable to achieve goal weight loss through lifestyle modification alone Current treatment: tirzepatide (Mounjaro) 5 mg subcutaneously once weekly  Patient states she is unable to get Mounjaro 5 mg since the pharmacy is out of stock and it is on backorder. This has been confirmed with FDA drug shortage website. History of bariatric surgery:  unknown Baseline weight: 285 lbs; most recent weight: 288  lbs Current meal patterns:  follows with dietician Recommend that the patient emphasize lean proteins, fruits and vegetables, whole grains and increased fiber consumption, adequate hydration Recommend diet modification to induce energy deficit of 500 kcal/day or greater Continue to titrate Mounjaro as able to tolerate. Continue as able to get from pharmacy during drug shortage.   Patient Goals/Self-Care Activities Patient will:  Take medications as prescribed Check blood sugar three times a day at the following times: fasting (at least 8 hours since last food consumption), 5-15 minutes before lunch, bedtime, and whenever patient experiences symptoms of hypo/hyperglycemia, document, and  provide at future appointments Collaborate with provider on medication access solutions Engage in dietary modifications by less frequent dining out, decreased fat intake, and fewer sweetened foods & beverages  Follow Up Plan: Telephone follow up appointment with care management team member scheduled for: 01/28/21      Medication Assistance: None required.  Patient affirms current coverage meets needs.  Patient's preferred pharmacy is:  [facility] [address], [place] [address] Phone: [phone] Fax: [phone]  Follow Up:  Patient agrees to Care Plan and Follow-up.  Plan: Telephone follow up appointment with care management team member scheduled for:  01/28/21  Kennon Holter, PharmD, BCACP, CPP Clinical Pharmacist Practitioner [place] [phone]

## 2021-01-03 DIAGNOSIS — H25813 Combined forms of age-related cataract, bilateral: Secondary | ICD-10-CM | POA: Diagnosis not present

## 2021-01-03 DIAGNOSIS — H01002 Unspecified blepharitis right lower eyelid: Secondary | ICD-10-CM | POA: Diagnosis not present

## 2021-01-03 DIAGNOSIS — H01001 Unspecified blepharitis right upper eyelid: Secondary | ICD-10-CM | POA: Diagnosis not present

## 2021-01-03 DIAGNOSIS — H43822 Vitreomacular adhesion, left eye: Secondary | ICD-10-CM | POA: Diagnosis not present

## 2021-01-03 DIAGNOSIS — H01004 Unspecified blepharitis left upper eyelid: Secondary | ICD-10-CM | POA: Diagnosis not present

## 2021-01-12 DIAGNOSIS — E785 Hyperlipidemia, unspecified: Secondary | ICD-10-CM

## 2021-01-12 DIAGNOSIS — E1165 Type 2 diabetes mellitus with hyperglycemia: Secondary | ICD-10-CM

## 2021-01-12 DIAGNOSIS — I10 Essential (primary) hypertension: Secondary | ICD-10-CM

## 2021-01-12 DIAGNOSIS — E1169 Type 2 diabetes mellitus with other specified complication: Secondary | ICD-10-CM | POA: Diagnosis not present

## 2021-01-28 ENCOUNTER — Ambulatory Visit (INDEPENDENT_AMBULATORY_CARE_PROVIDER_SITE_OTHER): Payer: Medicare HMO | Admitting: Pharmacist

## 2021-01-28 DIAGNOSIS — I10 Essential (primary) hypertension: Secondary | ICD-10-CM

## 2021-01-28 DIAGNOSIS — E1169 Type 2 diabetes mellitus with other specified complication: Secondary | ICD-10-CM

## 2021-01-28 DIAGNOSIS — E1165 Type 2 diabetes mellitus with hyperglycemia: Secondary | ICD-10-CM

## 2021-01-28 NOTE — Chronic Care Management (AMB) (Signed)
Chronic Care Management Pharmacy Note  01/28/2021 Name:  [name] MRN:  [id number] DOB:  Oct 16, 1950  Summary: Type 2 Diabetes Uncontrolled but improving; Most recent A1c above goal of <7% per ADA guidelines Patient was finally able to fill Mounjaro again at the pharmacy on 01/23/21 after backorder issues over the last several weeks. Plans to restart therapy in next couple days.  Follows with [name] dietician (next appointment 01/29/21). Continue to work with dietician to reduce intake of carbohydrates through diet.  Current glucose readings:  patient verbally reports blood glucose "no 200s. All in the 100s"  Continue metformin 1,000 mg by mouth twice daily Continue tirzepatide (Mounjaro) 5 mg subcutaneously once weekly  Hyperlipidemia Re-check lipid panel before next PCP appointment in March 2023. Expect lipid profile to be improved.  Continue rosuvastatin 40 mg by mouth daily If LDL remains above goal on high intensity statin then will recommend addition of ezetimibe 10 mg by mouth daily.  If triglycerides continue to be elevated despite improvement in blood glucose, will consider Vascepa.   Overweight/Obesity Continue to titrate Mounjaro as able to tolerate   Subjective: [name] is an 71 y.o. year old female who is a primary patient of Luking, Elayne Snare, MD.  The CCM team was consulted for assistance with disease management and care coordination needs.    Engaged with patient by telephone for follow up visit in response to provider referral for pharmacy case management and/or care coordination services.   Consent to Services:  The patient was given information about Chronic Care Management services, agreed to services, and gave verbal consent prior to initiation of services.  Please see initial visit note for detailed documentation.   Patient Care Team: Kathyrn Drown, MD as PCP - General (Family Medicine) Satira Sark, MD as PCP - Cardiology  (Cardiology) Pieter Partridge, DO as Consulting Physician (Neurology) Eloise Harman, DO as Consulting Physician (Internal Medicine) Beryle Lathe, [facility] (Pharmacist)  Objective:  Lab Results  Component Value Date   CREATININE 1.08 (H) 12/03/2020   CREATININE 0.95 09/03/2020   CREATININE 0.97 10/24/2019    Lab Results  Component Value Date   HGBA1C 10.3 (H) 12/03/2020   Last diabetic Eye exam:  Lab Results  Component Value Date/Time   HMDIABEYEEXA No Retinopathy 10/17/2020 12:00 AM    Last diabetic Foot exam: No results found for: HMDIABFOOTEX      Component Value Date/Time   CHOL 341 (H) 09/03/2020 1016   TRIG 190 (H) 09/03/2020 1016   HDL 47 09/03/2020 1016   CHOLHDL 7.3 (H) 09/03/2020 1016   CHOLHDL 4.6 12/27/2013 0710   VLDL 22 12/27/2013 0710   LDLCALC 256 (H) 09/03/2020 1016    Hepatic Function Latest Ref Rng & Units 09/03/2020 10/24/2019 11/18/2018  Total Protein 6.0 - 8.5 g/dL 7.8 7.9 7.8  Albumin 3.8 - 4.8 g/dL 4.0 4.1 4.2  AST 0 - 40 IU/L 22 17 14   ALT 0 - 32 IU/L 25 21 15   Alk Phosphatase 44 - 121 IU/L 155(H) 182(H) 195(H)  Total Bilirubin 0.0 - 1.2 mg/dL 0.3 0.3 0.3  Bilirubin, Direct 0.00 - 0.40 mg/dL <0.10 - 0.08    Lab Results  Component Value Date/Time   TSH 1.550 09/03/2020 10:16 AM   FREET4 1.15 09/03/2020 10:16 AM    CBC Latest Ref Rng & Units 04/09/2015 09/14/2013 09/12/2010  WBC 3.4 - 10.8 x10E3/uL 7.7 10.0 10.5  Hemoglobin 11.1 - 15.9 g/dL 12.7 14.1 10.4(L)  Hematocrit 34.0 - 46.6 % 38.8 41.8 31.3(L)  Platelets 150 - 379 x10E3/uL 257 PLATELET CLUMPS NOTED ON SMEAR, COUNT APPEARS ADEQUATE 229    No results found for: VD25OH  Clinical ASCVD: No  The ASCVD Risk score (Arnett DK, et al., 2019) failed to calculate for the following reasons:   The valid total cholesterol range is 130 to 320 mg/dL    Social History   Tobacco Use  Smoking Status Never  Smokeless Tobacco Never   BP Readings from Last 3 Encounters:  12/24/20 (!)  113/56  12/04/20 128/72  12/04/20 129/74   Pulse Readings from Last 3 Encounters:  12/24/20 68  12/04/20 88  12/04/20 77   Wt Readings from Last 3 Encounters:  12/19/20 288 lb 6.4 oz (130.8 kg)  12/04/20 288 lb 6.4 oz (130.8 kg)  12/04/20 288 lb 9.6 oz (130.9 kg)    Assessment: Review of patient past medical history, allergies, medications, health status, including review of consultants reports, laboratory and other test data, was performed as part of comprehensive evaluation and provision of chronic care management services.   SDOH:  (Social Determinants of Health) assessments and interventions performed:  SDOH Interventions    Flowsheet Row Most Recent Value  SDOH Interventions   SDOH Interventions for the Following Domains Financial Strain  Financial Strain Interventions Other (Comment)  [Confirmed patient has Medicare Extra Help to lower prescription drug costs]       CCM Care Plan  Allergies  Allergen Reactions   Penicillins Itching    Has patient had a PCN reaction causing immediate rash, facial/tongue/throat swelling, SOB or lightheadedness with hypotension: No Has patient had a PCN reaction causing severe rash involving mucus membranes or skin necrosis: No Has patient had a PCN reaction that required hospitalization: No Has patient had a PCN reaction occurring within the last 10 years: No If all of the above answers are "NO", then may proceed with Cephalosporin use.    Tramadol Itching   Hydrocodone Itching    Medications Reviewed Today     Reviewed by Beryle Lathe, [facility] (Pharmacist) on 01/28/21 at 1043  Med List Status: <None>   Medication Order Taking? Sig Documenting Provider Last Dose Status Informant  Accu-Chek Softclix Lancets lancets [id number] Yes  [provider] Taking Active   Blood Glucose Monitoring Suppl (ACCU-CHEK GUIDE ME) w/Device KIT [id number] Yes  [provider] Taking Active   glucose blood test strip [id number]  Yes Use as instructed to test blood glucose three time daily Luking, Elayne Snare, MD Taking Active Self  metFORMIN (GLUCOPHAGE) 500 MG tablet [id number] Yes TAKE 2 TABLETS TWICE DAILY WITH MEALS Luking, Elayne Snare, MD Taking Active   rosuvastatin (CRESTOR) 40 MG tablet [id number] Yes Take 1 tablet (40 mg total) by mouth daily. Kathyrn Drown, MD Taking Active Self  tirzepatide [facility]) 5 MG/0.5ML Pen [id number] Yes Inject 5 mg once weekly into skin Kathyrn Drown, MD Taking Active            Med Note Rhea Belton Jan 28, 2021 10:42 AM)              Patient Active Problem List   Diagnosis Date Noted   Vaginal itching 10/08/2020   Yeast vaginitis 10/08/2020   Encounter for screening colonoscopy 12/13/2019   Occipital neuralgia of left side 06/24/2019   Chronic intractable headache 06/24/2019   Uncontrolled type 2 diabetes mellitus with hyperglycemia ([place]) 01/19/2019   S/P total knee replacement, right  2007 01/12/2019   Special screening for malignant neoplasms, colon 12/22/2018   Type 2 diabetes mellitus with diabetic neuropathy, unspecified ([place]) 02/04/2018   Carpal tunnel syndrome 05/16/2015   Morbid obesity ([place]) 03/13/2015   Elevated alkaline phosphatase level 09/15/2013   HTN (hypertension) 09/14/2013   Type 2 diabetes mellitus with hemoglobin A1c goal of less than 7.5% ([place]) 10/04/2012   Lumbago with sciatica of left side 10/22/2010   Difficulty in walking(719.7) 10/16/2010   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 09/23/2010   Acquired trigger finger 08/15/2010   OA (osteoarthritis) of knee 08/15/2010   KNEE PAIN 05/26/2007   S/P hip replacement, left 05/26/2007   DEGENERATIVE JOINT DISEASE, LEFT HIP 12/03/2006    Immunization History  Administered Date(s) Administered   Fluad Quad(high Dose 65+) 10/24/2019, 10/31/2020   Influenza Split 10/04/2012, 09/13/2013   Influenza,inj,Quad PF,6+ Mos 09/22/2014, 10/05/2015, 11/04/2017   Influenza-Unspecified  09/14/2018   Moderna Sars-Covid-2 Vaccination 03/15/2019, 04/12/2019   Pneumococcal Conjugate-13 04/24/2016   Pneumococcal Polysaccharide-23 06/14/2013, 10/24/2019   Td 06/14/2013   Zoster Recombinat (Shingrix) 02/03/2018    Conditions to be addressed/monitored: HTN, HLD, DMII, and obesity  Care Plan : Medication Management  Updates made by Beryle Lathe, [place] since 01/28/2021 12:00 AM     Problem: T2DM, HTN, HLD, Weight Management   Priority: High  Onset Date: 11/12/2020     Long-Range Goal: Disease Progression Prevention   Start Date: 11/12/2020  Expected End Date: 02/10/2021  Recent Progress: On track  Priority: High  Note:   Current Barriers:  Unable to achieve control of diabetes and hyperlipidemia  Pharmacist Clinical Goal(s):  Patient will Achieve control of diabetes and hyperlipidemia as evidenced by improved fasting blood sugar, improved A1c, improved LDL, and improved triglycerides through collaboration with PharmD and provider.   Interventions: 1:1 collaboration with Kathyrn Drown, MD regarding development and update of comprehensive plan of care as evidenced by provider attestation and co-signature Inter-disciplinary care team collaboration (see longitudinal plan of care) Comprehensive medication review performed; medication list updated in electronic medical record  Type 2 Diabetes - Goal on Track (progressing): YES. Uncontrolled but improving; Most recent A1c above goal of <7% per ADA guidelines Current medications: metformin 1,000 mg by mouth twice daily and tirzepatide (Mounjaro) 5 mg subcutaneously once weekly Patient was finally able to fill Mounjaro again at the pharmacy on 01/23/21 after backorder issues over the last several weeks. Plans to restart therapy in next couple days.  Intolerances: none Taking medications as directed: yes Side effects thought to be attributed to current medication regimen: no Hypoglycemia prevention: not indicated at  this time Current meal patterns:  follows with [name] dietician (next appointment 01/29/21);  has cut out soda, juice, sweet tea, and carb intake from foods such as sweets, rice, potatoes, and breads Current exercise:  does some home workouts a few times per week On a statin: yes On aspirin 81 mg daily: no Last microalbumin: <6; on an ACEi/ARB: no Last eye exam: completed within last year Last foot exam: completed within last year Current glucose readings:  patient verbally reports blood glucose "no 200s. All in the 100s"  Continue metformin 1,000 mg by mouth twice daily Continue tirzepatide (Mounjaro) 5 mg subcutaneously once weekly Continue to work with dietician to reduce intake of carbohydrates through diet.  Instructed to monitor blood sugars three times a day at the following times: fasting (at least 8 hours since last food consumption), 5-15 minutes before lunch, bedtime, and whenever patient experiences  symptoms of hypo/hyperglycemia  Patient was instructed on treatment of hypoglycemia. If you have a low blood sugar less than 70, please eat 15 grams of carbohydrates (4 oz of juice, soda, 4 glucose tablets, or 3-4 pieces of hard candy). Wait 15 minutes and then recheck your blood sugar. If your sugar is still less than 70, eat another 15 grams of carbohydrates. Wait another 15 minutes and recheck your glucose. Continue this until your sugar is over 70. Then, eat a snack with protein in it to prevent your sugar from dropping again. Patient was previously instructed on appropriate self administration technique for injection of Mounjaro  Hypertension - Goal Met.: Blood pressure under good control. Blood pressure is at goal of <130/80 mmHg per 2017 AHA/ACC guidelines. Current medications:  none Current home blood pressure: not discussed today Blood pressure medications not indicated at present Continue to monitor blood pressure  Hyperlipidemia - Goal on Track (progressing):  YES.: Uncontrolled. LDL above goal of <70 due to very high risk given 10-year risk >20% per 2020 AACE/ACE guidelines. Triglycerides above goal of <150 per 2020 AACE/ACE guidelines. Current medications: rosuvastatin 40 mg by mouth once daily Intolerances: none Taking medications as directed: yes Side effects thought to be attributed to current medication regimen: no Encourage dietary reduction of high fat containing foods such as butter, nuts, bacon, egg yolks, etc. Discussed need for and importance of continued work on weight loss Reviewed risks of hyperlipidemia, principles of treatment and consequences of untreated hyperlipidemia Discussed need for medication compliance Re-check lipid panel before next PCP appointment in March 2023. Expect lipid profile to be improved.  Continue rosuvastatin 40 mg by mouth daily If LDL remains above goal on high intensity statin then will recommend addition of ezetimibe 10 mg by mouth daily.  If triglycerides continue to be elevated despite improvement in blood glucose, will consider Vascepa.   Overweight/Obesity - Goal on Track (progressing): YES.: Unable to achieve goal weight loss through lifestyle modification alone Current treatment: tirzepatide (Mounjaro) 5 mg subcutaneously once weekly  Patient was finally able to fill Mounjaro again at the pharmacy on 01/23/21 after backorder issues over the last several weeks. Plans to restart therapy in next couple days.  History of bariatric surgery:  unknown Baseline weight: 285 lbs; most recent weight: 288 lbs Current meal patterns:  follows with dietician. Next appointment 01/29/21. Recommend that the patient emphasize lean proteins, fruits and vegetables, whole grains and increased fiber consumption, adequate hydration Recommend diet modification to induce energy deficit of 500 kcal/day or greater Continue to titrate Mounjaro as able to tolerate   Patient Goals/Self-Care Activities Patient will:  Take  medications as prescribed Check blood sugar three times a day at the following times: fasting (at least 8 hours since last food consumption), 5-15 minutes before lunch, bedtime, and whenever patient experiences symptoms of hypo/hyperglycemia, document, and provide at future appointments Collaborate with provider on medication access solutions Engage in dietary modifications by less frequent dining out, decreased fat intake, and fewer sweetened foods & beverages  Follow Up Plan: Telephone follow up appointment with care management team member scheduled for: 04/01/21      Medication Assistance: None required.  Patient affirms current coverage meets needs. Patient has Medicare Extra Help (low income subsidy)  Patient's preferred pharmacy is:  [place], [place] - [address] [place] [address] [place] [address] Phone: [phone] Fax: [phone]  Follow Up:  Patient agrees to Care Plan and Follow-up.  Plan: Telephone  follow up appointment with care management team member scheduled for:  04/01/21  Kennon Holter, PharmD, BCACP, CPP Clinical Pharmacist Practitioner [place] [phone]

## 2021-01-28 NOTE — Patient Instructions (Addendum)
[name],  It was great to talk to you today!  Please call me with any questions or concerns.  Visit Information  Following are the goals we discussed today:   Goals Addressed             This Visit's Progress    Medication Management       Patient Goals/Self-Care Activities Patient will:  Take medications as prescribed Check blood sugar three times a day at the following times: fasting (at least 8 hours since last food consumption), 5-15 minutes before lunch, bedtime, and whenever patient experiences symptoms of hypo/hyperglycemia, document, and provide at future appointments Collaborate with provider on medication access solutions Engage in dietary modifications by less frequent dining out, decreased fat intake, and fewer sweetened foods & beverages             Follow-up plan: Telephone follow up appointment with care management team member scheduled for:  04/01/21  The patient verbalized understanding of instructions, educational materials, and care plan provided today and agreed to receive a mailed copy of patient instructions, educational materials, and care plan.   Please call the care guide team at [phone] if you need to cancel or reschedule your appointment.   Kennon Holter, PharmD, [place], CPP Clinical Pharmacist Practitioner [place] [phone]

## 2021-01-29 ENCOUNTER — Encounter: Payer: Medicare HMO | Attending: Family Medicine | Admitting: Nutrition

## 2021-01-29 ENCOUNTER — Encounter: Payer: Self-pay | Admitting: Nutrition

## 2021-01-29 ENCOUNTER — Other Ambulatory Visit: Payer: Self-pay

## 2021-01-29 VITALS — Ht 67.0 in | Wt 292.6 lb

## 2021-01-29 DIAGNOSIS — I10 Essential (primary) hypertension: Secondary | ICD-10-CM | POA: Insufficient documentation

## 2021-01-29 DIAGNOSIS — E1165 Type 2 diabetes mellitus with hyperglycemia: Secondary | ICD-10-CM | POA: Diagnosis not present

## 2021-01-29 DIAGNOSIS — E782 Mixed hyperlipidemia: Secondary | ICD-10-CM | POA: Diagnosis not present

## 2021-01-29 NOTE — Progress Notes (Signed)
Medical Nutrition Therapy DM follow up Appointment Start time:  0900  Appointment End time:  0930 Primary concerns today: DM Type 2  Referral diagnosis: E11.8 Preferred learning style: no preference.  Learning readiness: contemplating      NUTRITION ASSESSMENT   Gets tired of cooking. May look into some Healthy Choice meals. Wants to get down to 180 lbs. Just got back on the Mounjario 5 mg. Was off mounjaro  due to unavailability and her BS went up in the 200's. When she is on it, BS are much better 100-150's. Has cut out snacking, cut out drinks, reduced rice. Urine is more  clear. Bowels are WNL 7 day avg 180 mg/dl, 174 mg/dl, 30 day avg 180 mg/dl.  She is on Crestor for her hyperlipidemia. She wants to lose down to 180 lbs and get off her medications. Starting to go back to the [employer].  Anthropometrics  Wt Readings from Last 3 Encounters:  12/19/20 288 lb 6.4 oz (130.8 kg)  12/04/20 288 lb 6.4 oz (130.8 kg)  12/04/20 288 lb 9.6 oz (130.9 kg)   Ht Readings from Last 3 Encounters:  12/19/20 5\' 7"  (1.702 m)  12/04/20 5\' 7"  (1.702 m)  12/04/20 5\' 7"  (1.702 m)   There is no height or weight on file to calculate BMI. @BMIFA @ Facility age limit for growth percentiles is 20 years. Facility age limit for growth percentiles is 20 years.    Clinical Medical Hx: DM Type 2, Obesity, HTN, Hyperlipidemia Medications: Metformin 500 mg BID Labs:  Lab Results  Component Value Date   HGBA1C 10.3 (H) 12/03/2020   CMP Latest Ref Rng & Units 12/03/2020 09/03/2020 10/24/2019  Glucose 70 - 99 mg/dL 114(H) 361(H) 384(H)  BUN 8 - 27 mg/dL 11 10 12   Creatinine 0.57 - 1.00 mg/dL 1.08(H) 0.95 0.97  Sodium 134 - 144 mmol/L 139 135 133(L)  Potassium 3.5 - 5.2 mmol/L 4.3 4.6 4.4  Chloride 96 - 106 mmol/L 101 98 98  CO2 20 - 29 mmol/L 24 20 24   Calcium 8.7 - 10.3 mg/dL 9.7 9.5 10.0  Total Protein 6.0 - 8.5 g/dL - 7.8 7.9  Total Bilirubin 0.0 - 1.2 mg/dL - 0.3 0.3  Alkaline Phos 44 - 121  IU/L - 155(H) 182(H)  AST 0 - 40 IU/L - 22 17  ALT 0 - 32 IU/L - 25 21   Lipid Panel     Component Value Date/Time   CHOL 341 (H) 09/03/2020 1016   TRIG 190 (H) 09/03/2020 1016   HDL 47 09/03/2020 1016   CHOLHDL 7.3 (H) 09/03/2020 1016   CHOLHDL 4.6 12/27/2013 0710   VLDL 22 12/27/2013 0710   LDLCALC 256 (H) 09/03/2020 1016   LABVLDL 38 09/03/2020 1016     Notable Signs/Symptoms: Tinggling in her hands. Tired of sticking fingers.  Lifestyle & Dietary Hx LIves by herself. Uses stove and airfryer. Occasionally goes out to eat to [employer] or [place] [employer].  Estimated daily fluid intake: 40 oz Supplements:  Sleep: 7 hours Stress / self-care: none Current average weekly physical activity: walks some in the house  24-Hr Dietary Recall First Meal:  8-9 am. Corn flakes. 2% milk Snack:  Second Meal:  Skipped-didn't feel well. Apple, water Snack:  Third Meal: Beef,  pasta, turnip greens. Garlic bread, water Beverages: water   Estimated Energy Needs Calories: 1200 Carbohydrate: 135g Protein: 90g Fat: 33g   NUTRITION DIAGNOSIS  NB-1.1 Food and nutrition-related knowledge deficit As related to Diabetes Type  2.  As evidenced by A1C 15.2%.   NUTRITION INTERVENTION  Nutrition education (E-1) on the following topics:  Nutrition and Diabetes education provided on My Plate, CHO counting, meal planning, portion sizes, timing of meals, avoiding snacks between meals unless having a low blood sugar, target ranges for A1C and blood sugars, signs/symptoms and treatment of hyper/hypoglycemia, monitoring blood sugars, taking medications as prescribed, benefits of exercising 30 minutes per day and prevention of complications of DM. Lifestyle Medicine - Whole Food, Plant Predominant Nutrition is highly recommended: Eat Plenty of vegetables, Mushrooms, fruits, Legumes, Whole Grains, Nuts, seeds in lieu of processed meats, processed snacks/pastries red meat, poultry, eggs.    -It is better  to avoid simple carbohydrates including: Cakes, Sweet Desserts, Ice Cream, Soda (diet and regular), Sweet Tea, Candies, Chips, Cookies, Store Bought Juices, Alcohol in Excess of  1-2 drinks a day, Lemonade,  Artificial Sweeteners, Doughnuts, Coffee Creamers, "Sugar-free" Products, etc, etc.  This is not a complete list.....  Exercise: If you are able: 30 -60 minutes a day ,4 days a week, or 150 minutes a week.  The longer the better.  Combine stretch, strength, and aerobic activities.  If you were told in the past that you have high risk for cardiovascular diseases, you may seek evaluation by your heart doctor prior to initiating moderate to intense exercise programs.  Handouts Provided Include  Nutrition Lifestyle   Learning Style & Readiness for Change Teaching method utilized: Visual & Auditory  Demonstrated degree of understanding via: Teach Back  Barriers to learning/adherence to lifestyle change: none  Goals Established by Pt  Get back to [facility] 3 times per week. Focus on more plant based foods Only eat fish or chickens Increase lower carb vegetables with lunch and dinner Keep drinking water-100 oz per day. Get A1C down to 7%.  MONITORING & EVALUATION Dietary intake, weekly physical activity, and blood sugars in 3-4 months.  Next Steps  Patient is to work on meal planning and testing blood sugars.Marland Kitchen

## 2021-01-29 NOTE — Patient Instructions (Signed)
Goals Established by Pt Goals Get back to [facility] 3 times per week. Focus on more plant based foods Only eat fish or chickens Increase lower carb vegetables with lunch and dinner Keep drinking water-100 oz per day. Get A1C down to 7%.

## 2021-02-12 DIAGNOSIS — I10 Essential (primary) hypertension: Secondary | ICD-10-CM | POA: Diagnosis not present

## 2021-02-12 DIAGNOSIS — E1165 Type 2 diabetes mellitus with hyperglycemia: Secondary | ICD-10-CM | POA: Diagnosis not present

## 2021-02-12 DIAGNOSIS — E785 Hyperlipidemia, unspecified: Secondary | ICD-10-CM | POA: Diagnosis not present

## 2021-02-12 DIAGNOSIS — E1169 Type 2 diabetes mellitus with other specified complication: Secondary | ICD-10-CM

## 2021-02-18 ENCOUNTER — Telehealth: Payer: Self-pay | Admitting: Family Medicine

## 2021-02-18 DIAGNOSIS — H25811 Combined forms of age-related cataract, right eye: Secondary | ICD-10-CM | POA: Diagnosis not present

## 2021-02-18 NOTE — Telephone Encounter (Signed)
Patient states she is tolerating the medication fine and is ok increasing the [facility]

## 2021-02-18 NOTE — Telephone Encounter (Signed)
Pt needs a refill on Rosuvastatin and Mounjaro at [facility].   [phone]

## 2021-02-18 NOTE — Telephone Encounter (Signed)
May have 6 months on the rosuvastatin As for the [facility] typically it is recommended to go up on the dose if tolerating Does she want to stay at the current dose or go up to the next level?

## 2021-02-18 NOTE — Telephone Encounter (Signed)
May utilize 7.5 mg Mounjaro May send in 1 month supply with 1 refill has follow-up visit in March

## 2021-02-19 ENCOUNTER — Encounter (HOSPITAL_COMMUNITY)
Admission: RE | Admit: 2021-02-19 | Discharge: 2021-02-19 | Disposition: A | Discharge status: Home / Self Care | Payer: Medicare HMO | Source: Ambulatory Visit | Attending: Ophthalmology | Admitting: Ophthalmology

## 2021-02-19 MED ORDER — TIRZEPATIDE 7.5 MG/0.5ML ~~LOC~~ SOAJ: 7.5000 mg | SUBCUTANEOUS | 1 refills | Status: DC

## 2021-02-19 NOTE — Telephone Encounter (Signed)
Patient informed medication sent to pharmacy and to follow up in March. Verbalized understanding

## 2021-02-20 NOTE — H&P (Signed)
Surgical History & Physical  Patient Name: [name] DOB: 12-Jul-1950  Surgery: Cataract extraction with intraocular lens implant phacoemulsification; Right Eye  Surgeon: Baruch Goldmann MD Surgery Date:  02-25-21 Pre-Op Date:  02-18-21  HPI: A 49 Yr. old female patient Pt referred by Dr. Jorja Loa for cataract evaluation. The patient complains of nighttime light - car headlights, street lamps etc. glare causing poor vision, which began 2-3 years ago. Both eyes are affected. The episode is gradual. The condition's severity is worsening. The complaint is associated with blurry vision and glare. Pt has d/c night time driving due to vision. This is negatively affecting the patient's quality of life and the patient is unable to function adequately in life with the current level of vision. Pt using AT's prn OU. Pt denies any eye pain or increase in floaters/flashes of light. *Pt did not fill out medical hx on ppwk due to vision. Pt states she has several medication allergies but unaware of what they may be. Medications pulled from eRx* HPI was performed by Baruch Goldmann .  Medical History: Cataracts Macula Degeneration Glaucoma Diabetes LDL NO heart problems or dialysis  Review of Systems Negative Allergic/Immunologic Negative Cardiovascular Negative Constitutional Negative Ear, Nose, Mouth & Throat Negative Endocrine Negative Eyes Negative Gastrointestinal Negative Genitourinary Negative Hemotologic/Lymphatic Negative Integumentary Negative Musculoskeletal Negative Neurological Negative Psychiatry Negative Respiratory  Social   Never smoked   Medication Calcium, Rosuvastatin, Metformin, Penicillin, Tramadol hydrochloride, Hydrocodone, Tirzepatide,   Sx/Procedures  None  Drug Allergies  Penicillin, Hydrocodone, Tramadol,   History & Physical: Heent: Cataract, Right Eye NECK: supple without bruits LUNGS: lungs clear to auscultation CV: regular rate and rhythm Abdomen:  soft and non-tender  Impression & Plan: Assessment: 1.  COMBINED FORMS AGE RELATED CATARACT; Both Eyes (H25.813) 2.  BLEPHARITIS; Right Upper Lid, Right Lower Lid, Left Upper Lid, Left Lower Lid (H01.001, H01.002,H01.004,H01.005) 3.  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  VITREOMACULAR TRACTION VMT; Left Eye [phone])  Plan: 1.  Cataract accounts for the patient's decreased vision. This visual impairment is not correctable with a tolerable change in glasses or contact lenses. Cataract surgery with an implantation of a new lens should significantly improve the visual and functional status of the patient. Discussed all risks, benefits, alternatives, and potential complications. Discussed the procedures and recovery. Patient desires to have surgery. A-scan ordered and performed today for intra-ocular lens calculations. The surgery will be performed in order to improve vision for driving, reading, and for eye examinations. Recommend phacoemulsification with intra-ocular lens. Recommend Dextenza for post-operative pain and inflammation. Right Eye worse - first. Dilates well - shugarcaine by protocol. [facility] [place]. in room.  2.  Recommend regular lid cleaning.  3.  Observe; Artificial tears as needed for irritation.  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  5.  on OCT. Monitor.

## 2021-02-25 ENCOUNTER — Ambulatory Visit (HOSPITAL_COMMUNITY)
Admission: RE | Admit: 2021-02-25 | Discharge: 2021-02-25 | Disposition: A | Discharge status: Home / Self Care | Payer: Medicare HMO | Attending: Ophthalmology | Admitting: Ophthalmology

## 2021-02-25 ENCOUNTER — Ambulatory Visit (HOSPITAL_COMMUNITY): Payer: Medicare HMO | Admitting: Anesthesiology

## 2021-02-25 ENCOUNTER — Encounter (HOSPITAL_COMMUNITY): Payer: Self-pay | Admitting: Ophthalmology

## 2021-02-25 ENCOUNTER — Ambulatory Visit (HOSPITAL_BASED_OUTPATIENT_CLINIC_OR_DEPARTMENT_OTHER): Payer: Medicare HMO | Admitting: Anesthesiology

## 2021-02-25 ENCOUNTER — Encounter (HOSPITAL_COMMUNITY)
Admission: RE | Disposition: A | Discharge status: Home / Self Care | Payer: Self-pay | Source: Home / Self Care | Attending: Ophthalmology

## 2021-02-25 ENCOUNTER — Other Ambulatory Visit: Payer: Self-pay

## 2021-02-25 DIAGNOSIS — H01002 Unspecified blepharitis right lower eyelid: Secondary | ICD-10-CM | POA: Diagnosis not present

## 2021-02-25 DIAGNOSIS — H25813 Combined forms of age-related cataract, bilateral: Secondary | ICD-10-CM | POA: Insufficient documentation

## 2021-02-25 DIAGNOSIS — H01001 Unspecified blepharitis right upper eyelid: Secondary | ICD-10-CM | POA: Insufficient documentation

## 2021-02-25 DIAGNOSIS — E1136 Type 2 diabetes mellitus with diabetic cataract: Secondary | ICD-10-CM | POA: Diagnosis not present

## 2021-02-25 DIAGNOSIS — H01005 Unspecified blepharitis left lower eyelid: Secondary | ICD-10-CM | POA: Diagnosis not present

## 2021-02-25 DIAGNOSIS — E119 Type 2 diabetes mellitus without complications: Secondary | ICD-10-CM | POA: Diagnosis not present

## 2021-02-25 DIAGNOSIS — H43822 Vitreomacular adhesion, left eye: Secondary | ICD-10-CM | POA: Diagnosis not present

## 2021-02-25 DIAGNOSIS — H11152 Pinguecula, left eye: Secondary | ICD-10-CM | POA: Insufficient documentation

## 2021-02-25 DIAGNOSIS — I10 Essential (primary) hypertension: Secondary | ICD-10-CM | POA: Diagnosis not present

## 2021-02-25 DIAGNOSIS — H2511 Age-related nuclear cataract, right eye: Secondary | ICD-10-CM | POA: Diagnosis not present

## 2021-02-25 DIAGNOSIS — H01004 Unspecified blepharitis left upper eyelid: Secondary | ICD-10-CM | POA: Insufficient documentation

## 2021-02-25 DIAGNOSIS — H2181 Floppy iris syndrome: Secondary | ICD-10-CM | POA: Diagnosis not present

## 2021-02-25 DIAGNOSIS — H25811 Combined forms of age-related cataract, right eye: Secondary | ICD-10-CM | POA: Diagnosis not present

## 2021-02-25 DIAGNOSIS — H35363 Drusen (degenerative) of macula, bilateral: Secondary | ICD-10-CM | POA: Diagnosis not present

## 2021-02-25 HISTORY — PX: CATARACT EXTRACTION W/PHACO: SHX586

## 2021-02-25 LAB — GLUCOSE, CAPILLARY: Glucose-Capillary: 105 mg/dL — ABNORMAL HIGH (ref 70–99)

## 2021-02-25 SURGERY — PHACOEMULSIFICATION, CATARACT, WITH IOL INSERTION
Anesthesia: Monitor Anesthesia Care | Site: Eye | Laterality: Right

## 2021-02-25 MED ORDER — MIDAZOLAM HCL 5 MG/5ML IJ SOLN
INTRAMUSCULAR | Status: DC | PRN
  Administered 2021-02-25: .5 mg via INTRAVENOUS
  Administered 2021-02-25: 1 mg via INTRAVENOUS
  Administered 2021-02-25: .5 mg via INTRAVENOUS

## 2021-02-25 MED ORDER — LIDOCAINE HCL (PF) 1 % IJ SOLN
INTRAOCULAR | Status: DC | PRN
  Administered 2021-02-25: 1 mL via OPHTHALMIC

## 2021-02-25 MED ORDER — BSS IO SOLN
INTRAOCULAR | Status: DC | PRN
  Administered 2021-02-25: 15 mL via INTRAOCULAR

## 2021-02-25 MED ORDER — STERILE WATER FOR IRRIGATION IR SOLN
Status: DC | PRN
  Administered 2021-02-25: 250 mL

## 2021-02-25 MED ORDER — POVIDONE-IODINE 5 % OP SOLN
OPHTHALMIC | Status: DC | PRN
  Administered 2021-02-25: 1 via OPHTHALMIC

## 2021-02-25 MED ORDER — NEOMYCIN-POLYMYXIN-DEXAMETH 3.5-10000-0.1 OP SUSP
OPHTHALMIC | Status: DC | PRN
  Administered 2021-02-25: 1 [drp] via OPHTHALMIC

## 2021-02-25 MED ORDER — EPINEPHRINE PF 1 MG/ML IJ SOLN
INTRAOCULAR | Status: DC | PRN
  Administered 2021-02-25: 500 mL

## 2021-02-25 MED ORDER — SODIUM HYALURONATE 23MG/ML IO SOSY
PREFILLED_SYRINGE | INTRAOCULAR | Status: DC | PRN
  Administered 2021-02-25: 0.6 mL via INTRAOCULAR

## 2021-02-25 MED ORDER — PHENYLEPHRINE HCL 2.5 % OP SOLN
1.0000 [drp] | OPHTHALMIC | Status: AC | PRN
  Administered 2021-02-25 (×3): 1 [drp] via OPHTHALMIC

## 2021-02-25 MED ORDER — MIDAZOLAM HCL 2 MG/2ML IJ SOLN
INTRAMUSCULAR | Status: AC
  Filled 2021-02-25: qty 2

## 2021-02-25 MED ORDER — SODIUM CHLORIDE 0.9% FLUSH
INTRAVENOUS | Status: DC | PRN
Start: 2021-02-25 — End: 2021-02-25
  Administered 2021-02-25 (×3): 10 mL via INTRAVENOUS

## 2021-02-25 MED ORDER — SODIUM HYALURONATE 10 MG/ML IO SOLUTION
PREFILLED_SYRINGE | INTRAOCULAR | Status: DC | PRN
  Administered 2021-02-25: 0.85 mL via INTRAOCULAR

## 2021-02-25 MED ORDER — PROPOFOL 10 MG/ML IV BOLUS
INTRAVENOUS | Status: AC
  Filled 2021-02-25: qty 20

## 2021-02-25 MED ORDER — TETRACAINE HCL 0.5 % OP SOLN
1.0000 [drp] | OPHTHALMIC | Status: AC | PRN
  Administered 2021-02-25 (×3): 1 [drp] via OPHTHALMIC

## 2021-02-25 MED ORDER — TROPICAMIDE 1 % OP SOLN
1.0000 [drp] | OPHTHALMIC | Status: AC | PRN
  Administered 2021-02-25 (×3): 1 [drp] via OPHTHALMIC
  Filled 2021-02-25: qty 2

## 2021-02-25 MED ORDER — LIDOCAINE HCL 3.5 % OP GEL
1.0000 "application " | Freq: Once | OPHTHALMIC | Status: AC
  Administered 2021-02-25: 1 via OPHTHALMIC

## 2021-02-25 MED ORDER — TRYPAN BLUE 0.06 % IO SOSY
PREFILLED_SYRINGE | INTRAOCULAR | Status: AC
  Filled 2021-02-25: qty 0.5

## 2021-02-25 SURGICAL SUPPLY — 16 items
CATARACT SUITE SIGHTPATH (MISCELLANEOUS) ×2 IMPLANT
CLOTH BEACON ORANGE TIMEOUT ST (SAFETY) ×2 IMPLANT
EYE SHIELD UNIVERSAL CLEAR (GAUZE/BANDAGES/DRESSINGS) ×1 IMPLANT
FEE CATARACT SUITE SIGHTPATH (MISCELLANEOUS) ×1 IMPLANT
GLOVE SURG UNDER POLY LF SZ6.5 (GLOVE) IMPLANT
GLOVE SURG UNDER POLY LF SZ7 (GLOVE) ×2 IMPLANT
LENS IOL RAYNER 22.0 (Intraocular Lens) ×2 IMPLANT
LENS IOL RAYONE EMV 22.0 (Intraocular Lens) IMPLANT
NDL HYPO 18GX1.5 BLUNT FILL (NEEDLE) ×1 IMPLANT
NEEDLE HYPO 18GX1.5 BLUNT FILL (NEEDLE) ×2 IMPLANT
PAD ARMBOARD 7.5X6 YLW CONV (MISCELLANEOUS) ×2 IMPLANT
RING MALYGIN 7.0 (MISCELLANEOUS) ×1 IMPLANT
SYR TB 1ML LL NO SAFETY (SYRINGE) ×2 IMPLANT
TAPE SURG TRANSPORE 1 IN (GAUZE/BANDAGES/DRESSINGS) IMPLANT
TAPE SURGICAL TRANSPORE 1 IN (GAUZE/BANDAGES/DRESSINGS) ×2
WATER STERILE IRR 250ML POUR (IV SOLUTION) ×2 IMPLANT

## 2021-02-25 NOTE — Discharge Instructions (Addendum)
Please discharge patient when stable, will follow up today with Dr. Wrzosek at the [facility] [place] office immediately following discharge.  Leave shield in place until visit.  All paperwork with discharge instructions will be given at the office.  [facility] Address:  [address]  [place], [place] [address]  

## 2021-02-25 NOTE — Anesthesia Postprocedure Evaluation (Signed)
Anesthesia Post Note  Patient: [name]  Procedure(s) Performed: CATARACT EXTRACTION PHACO AND INTRAOCULAR LENS PLACEMENT (IOC) (Right: Eye)  Patient location during evaluation: Phase II Anesthesia Type: MAC Level of consciousness: awake Pain management: pain level controlled Vital Signs Assessment: post-procedure vital signs reviewed and stable Respiratory status: spontaneous breathing and respiratory function stable Cardiovascular status: blood pressure returned to baseline and stable Postop Assessment: no headache and no apparent nausea or vomiting Anesthetic complications: no Comments: Late entry   No notable events documented.   Last Vitals:  Vitals:   02/25/21 0912 02/25/21 1033  BP: (!) 143/73 126/88  Pulse: 72   Resp: 16 16  Temp: 36.8 C 36.7 C  SpO2: 100% 99%    Last Pain:  Vitals:   02/25/21 1033  TempSrc: Oral  PainSc: 0-No pain                 Louann Sjogren

## 2021-02-25 NOTE — Anesthesia Preprocedure Evaluation (Signed)
Anesthesia Evaluation  Patient identified by MRN, date of birth, ID band Patient awake    Reviewed: Allergy & Precautions, H&P , NPO status , Patient's Chart, lab work & pertinent test results, reviewed documented beta blocker date and time   History of Anesthesia Complications (+) PONV and history of anesthetic complications  Airway Mallampati: II  TM Distance: >3 FB Neck ROM: full    Dental no notable dental hx.    Pulmonary neg pulmonary ROS,    Pulmonary exam normal breath sounds clear to auscultation       Cardiovascular Exercise Tolerance: Good hypertension, negative cardio ROS   Rhythm:regular Rate:Normal     Neuro/Psych  Headaches,  Neuromuscular disease negative psych ROS   GI/Hepatic negative GI ROS, Neg liver ROS,   Endo/Other  diabetes, Type 2Morbid obesity  Renal/GU negative Renal ROS  negative genitourinary   Musculoskeletal   Abdominal   Peds  Hematology negative hematology ROS (+)   Anesthesia Other Findings   Reproductive/Obstetrics negative OB ROS                             Anesthesia Physical Anesthesia Plan  ASA: 3  Anesthesia Plan: MAC   Post-op Pain Management: Minimal or no pain anticipated   Induction:   PONV Risk Score and Plan:   Airway Management Planned:   Additional Equipment:   Intra-op Plan:   Post-operative Plan:   Informed Consent: I have reviewed the patients History and Physical, chart, labs and discussed the procedure including the risks, benefits and alternatives for the proposed anesthesia with the patient or authorized representative who has indicated his/her understanding and acceptance.     Dental Advisory Given  Plan Discussed with: CRNA  Anesthesia Plan Comments:         Anesthesia Quick Evaluation

## 2021-02-25 NOTE — Op Note (Signed)
Date of procedure: 02/25/21  Pre-operative diagnosis: Visually significant age-related combined cataract, Right Eye; Poor Dilation, Right Eye (H25.811)  Post-operative diagnosis: Visually significant age-related cataract, Right Eye; Intra-operative Floppy Iris Syndrome, Right Eye (H21.81)  Procedure: Removal of cataract via phacoemulsification and insertion of intra-ocular lens Rayner [id number] +22.0D into the capsular bag of the Right Eye (CPT [phone])  Attending surgeon: Gerda Diss. Brinklee Cisse, MD, MA  Anesthesia: MAC, Topical Akten  Complications: None  Estimated Blood Loss: <35m (minimal)  Specimens: None  Implants: As above  Indications:  Visually significant cataract, Right Eye  Procedure:  The patient was seen and identified in the pre-operative area. The operative eye was identified and dilated.  The operative eye was marked.  Topical anesthesia was administered to the operative eye.     The patient was then to the operative suite and placed in the supine position.  A timeout was performed confirming the patient, procedure to be performed, and all other relevant information.   The patient's face was prepped and draped in the usual fashion for intra-ocular surgery.  A lid speculum was placed into the operative eye and the surgical microscope moved into place and focused.  Poor dilation of the iris was confirmed.  A superotemporal paracentesis was created using a 20 gauge paracentesis blade.  Shugarcaine was injected into the anterior chamber.  Viscoelastic was injected into the anterior chamber.  A temporal clear-corneal main wound incision was created using a 2.4107mmicrokeratome.  A Malyugin ring was placed.  A continuous curvilinear capsulorrhexis was initiated using an irrigating cystitome and completed using capsulorrhexis forceps.  Hydrodissection and hydrodeliniation were performed.  Viscoelastic was injected into the anterior chamber.  A phacoemulsification handpiece and a chopper as a  second instrument were used to remove the nucleus and epinucleus. The irrigation/aspiration handpiece was used to remove any remaining cortical material.   The capsular bag was reinflated with viscoelastic, checked, and found to be intact.  The intraocular lens was inserted into the capsular bag and dialed into place using a MaSurveyor, minerals The Malyugin ring was removed.  The irrigation/aspiration handpiece was used to remove any remaining viscoelastic.  The clear corneal wound and paracentesis wounds were then hydrated and checked with Weck-Cels to be watertight.  The lid-speculum and drape was removed, and the patient's face was cleaned with a wet and dry 4x4.  Maxitrol was instilled in the eye. A clear shield was taped over the eye. The patient was taken to the post-operative care unit in good condition, having tolerated the procedure well.  Post-Op Instructions: [facility] and will receive all other orders and instructions.

## 2021-02-25 NOTE — Transfer of Care (Signed)
Immediate Anesthesia Transfer of Care Note  Patient: [name]  Procedure(s) Performed: CATARACT EXTRACTION PHACO AND INTRAOCULAR LENS PLACEMENT (IOC) (Right: Eye)  Patient Location: PACU  Anesthesia Type:MAC  Level of Consciousness: awake, alert  and oriented  Airway & Oxygen Therapy: Patient Spontanous Breathing  Post-op Assessment: Report given to RN, Post -op Vital signs reviewed and stable, Patient moving all extremities X 4 and Patient able to stick tongue midline  Post vital signs: Reviewed  Last Vitals:  Vitals Value Taken Time  BP 126/88   Temp 98.0   Pulse 76   Resp 15   SpO2 98     Last Pain:  Vitals:   02/25/21 0912  TempSrc: Oral  PainSc: 0-No pain         Complications: No notable events documented.

## 2021-02-25 NOTE — Interval H&P Note (Signed)
History and Physical Interval Note:  02/25/2021 9:58 AM  [name]  has presented today for surgery, with the diagnosis of combined forms age related cataract; right.  The various methods of treatment have been discussed with the patient and family. After consideration of risks, benefits and other options for treatment, the patient has consented to  Procedure(s) with comments: CATARACT EXTRACTION PHACO AND INTRAOCULAR LENS PLACEMENT (IOC) (Right) - right as a surgical intervention.  The patient's history has been reviewed, patient examined, no change in status, stable for surgery.  I have reviewed the patient's chart and labs.  Questions were answered to the patient's satisfaction.     Baruch Goldmann

## 2021-02-26 ENCOUNTER — Encounter (HOSPITAL_COMMUNITY): Payer: Self-pay | Admitting: Ophthalmology

## 2021-02-28 DIAGNOSIS — H25812 Combined forms of age-related cataract, left eye: Secondary | ICD-10-CM | POA: Diagnosis not present

## 2021-03-05 ENCOUNTER — Encounter (HOSPITAL_COMMUNITY): Payer: Self-pay

## 2021-03-05 ENCOUNTER — Encounter (HOSPITAL_COMMUNITY)
Admission: RE | Admit: 2021-03-05 | Discharge: 2021-03-05 | Disposition: A | Discharge status: Home / Self Care | Payer: Medicare HMO | Source: Ambulatory Visit | Attending: Ophthalmology | Admitting: Ophthalmology

## 2021-03-05 ENCOUNTER — Other Ambulatory Visit: Payer: Self-pay

## 2021-03-08 NOTE — H&P (Signed)
Surgical History & Physical  Patient Name: [name] DOB: Feb 06, 1950  Surgery: Cataract extraction with intraocular lens implant phacoemulsification; Left Eye  Surgeon: Baruch Goldmann MD Surgery Date:  03-11-21 Pre-Op Date:  03-04-21  HPI: A 68 Yr. old female patient The patient is returning after cataract surgery. The right eye is affected. Status post cataract surgery, which began 1 weeks ago: Since the last visit, the affected area is doing well. The patient's vision is improved. Patient is following medication instructions. The patient experiences no floaters or flashes. The patient is also returning for a cataract follow-up of the left eye. Since the last visit, the affected area is worsening. The patient has difficulty reading small print on medicine bottles/books, difficulty driving at night due to halos/glare, and hazy or blurry vision. This is negatively affecting the patient's quality of life and the patient is unable to function adequately in life with the current level of vision.  Medical History: Cataracts Macula Degeneration Glaucoma Diabetes LDL NO heart problems or dialysis  Review of Systems Negative Allergic/Immunologic Negative Cardiovascular Negative Constitutional Negative Ear, Nose, Mouth & Throat Negative Endocrine Negative Eyes Negative Gastrointestinal Negative Genitourinary Negative Hemotologic/Lymphatic Negative Integumentary Negative Musculoskeletal Negative Neurological Negative Psychiatry Negative Respiratory  Social   Never smoked   Medication Prednisolone-Moxifloxacin-Bromfenac, Calcium, Rosuvastatin, Metformin, Penicillin, Tramadol hydrochloride, Hydrocodone, Tirzepatide,   Sx/Procedures Phaco c IOL OD,   Drug Allergies  Penicillin, Hydrocodone, Tramadol,   History & Physical: Heent: Cataract, Left Eye NECK: supple without bruits LUNGS: lungs clear to auscultation CV: regular rate and rhythm Abdomen: soft and  non-tender  Impression & Plan: Assessment: 1.  CATARACT EXTRACTION STATUS; Right Eye (Z98.41) 2.  COMBINED FORMS AGE RELATED CATARACT; Left Eye (H25.812)  Plan: 1.  1 week after cataract surgery. Doing well with improved vision and normal eye pressure. Call with any problems or concerns. Continue Pred-Moxi-Brom 2x/day for 3 more weeks.  2.  Left Eye. Surgery required to correct imbalance of vision. Cataract accounts for the patient's decreased vision. This visual impairment is not correctable with a tolerable change in glasses or contact lenses. Cataract surgery with an implantation of a new lens should significantly improve the visual and functional status of the patient. Discussed all risks, benefits, alternatives, and potential complications. Discussed the procedures and recovery. Patient desires to have surgery. A-scan ordered and performed today for intra-ocular lens calculations. The surgery will be performed in order to improve vision for driving, reading, and for eye examinations. Recommend phacoemulsification with intra-ocular lens. Recommend Dextenza for post-operative pain and inflammation. Dilates poorly - shugarcaine by protocol. Malyugin Ring. [place].

## 2021-03-11 ENCOUNTER — Ambulatory Visit (HOSPITAL_COMMUNITY): Payer: Medicare HMO | Admitting: Anesthesiology

## 2021-03-11 ENCOUNTER — Encounter (HOSPITAL_COMMUNITY)
Admission: RE | Disposition: A | Discharge status: Home / Self Care | Payer: Self-pay | Source: Home / Self Care | Attending: Ophthalmology

## 2021-03-11 ENCOUNTER — Ambulatory Visit (HOSPITAL_BASED_OUTPATIENT_CLINIC_OR_DEPARTMENT_OTHER): Payer: Medicare HMO | Admitting: Anesthesiology

## 2021-03-11 ENCOUNTER — Ambulatory Visit (HOSPITAL_COMMUNITY)
Admission: RE | Admit: 2021-03-11 | Discharge: 2021-03-11 | Disposition: A | Discharge status: Home / Self Care | Payer: Medicare HMO | Attending: Ophthalmology | Admitting: Ophthalmology

## 2021-03-11 ENCOUNTER — Encounter (HOSPITAL_COMMUNITY): Payer: Self-pay | Admitting: Ophthalmology

## 2021-03-11 ENCOUNTER — Other Ambulatory Visit: Payer: Self-pay

## 2021-03-11 DIAGNOSIS — E1136 Type 2 diabetes mellitus with diabetic cataract: Secondary | ICD-10-CM | POA: Diagnosis not present

## 2021-03-11 DIAGNOSIS — E119 Type 2 diabetes mellitus without complications: Secondary | ICD-10-CM | POA: Diagnosis not present

## 2021-03-11 DIAGNOSIS — H25812 Combined forms of age-related cataract, left eye: Secondary | ICD-10-CM

## 2021-03-11 DIAGNOSIS — M1990 Unspecified osteoarthritis, unspecified site: Secondary | ICD-10-CM | POA: Diagnosis not present

## 2021-03-11 DIAGNOSIS — H409 Unspecified glaucoma: Secondary | ICD-10-CM | POA: Insufficient documentation

## 2021-03-11 DIAGNOSIS — I10 Essential (primary) hypertension: Secondary | ICD-10-CM

## 2021-03-11 DIAGNOSIS — Z7984 Long term (current) use of oral hypoglycemic drugs: Secondary | ICD-10-CM | POA: Insufficient documentation

## 2021-03-11 DIAGNOSIS — Z79899 Other long term (current) drug therapy: Secondary | ICD-10-CM | POA: Diagnosis not present

## 2021-03-11 HISTORY — PX: CATARACT EXTRACTION W/PHACO: SHX586

## 2021-03-11 LAB — GLUCOSE, CAPILLARY: Glucose-Capillary: 106 mg/dL — ABNORMAL HIGH (ref 70–99)

## 2021-03-11 SURGERY — PHACOEMULSIFICATION, CATARACT, WITH IOL INSERTION
Anesthesia: Monitor Anesthesia Care | Site: Eye | Laterality: Left

## 2021-03-11 MED ORDER — PHENYLEPHRINE-KETOROLAC 1-0.3 % IO SOLN
INTRAOCULAR | Status: DC | PRN
  Administered 2021-03-11: 500 mL via OPHTHALMIC

## 2021-03-11 MED ORDER — LIDOCAINE HCL 3.5 % OP GEL
1.0000 "application " | Freq: Once | OPHTHALMIC | Status: AC
  Administered 2021-03-11: 1 via OPHTHALMIC

## 2021-03-11 MED ORDER — SODIUM CHLORIDE 0.9% FLUSH
INTRAVENOUS | Status: DC | PRN
  Administered 2021-03-11: 5 mL via INTRAVENOUS

## 2021-03-11 MED ORDER — SODIUM HYALURONATE 23MG/ML IO SOSY
PREFILLED_SYRINGE | INTRAOCULAR | Status: DC | PRN
  Administered 2021-03-11: 0.6 mL via INTRAOCULAR

## 2021-03-11 MED ORDER — PHENYLEPHRINE-KETOROLAC 1-0.3 % IO SOLN
INTRAOCULAR | Status: AC
Start: 2021-03-11 — End: ?
  Filled 2021-03-11: qty 4

## 2021-03-11 MED ORDER — NEOMYCIN-POLYMYXIN-DEXAMETH 3.5-10000-0.1 OP SUSP
OPHTHALMIC | Status: DC | PRN
  Administered 2021-03-11: 1 [drp] via OPHTHALMIC

## 2021-03-11 MED ORDER — MIDAZOLAM HCL 2 MG/2ML IJ SOLN
INTRAMUSCULAR | Status: AC
  Filled 2021-03-11: qty 2

## 2021-03-11 MED ORDER — PHENYLEPHRINE HCL 2.5 % OP SOLN
1.0000 [drp] | OPHTHALMIC | Status: AC | PRN
  Administered 2021-03-11 (×3): 1 [drp] via OPHTHALMIC

## 2021-03-11 MED ORDER — EPINEPHRINE PF 1 MG/ML IJ SOLN
INTRAMUSCULAR | Status: AC
  Filled 2021-03-11: qty 1

## 2021-03-11 MED ORDER — MIDAZOLAM HCL 2 MG/2ML IJ SOLN
INTRAMUSCULAR | Status: DC | PRN
  Administered 2021-03-11: 2 mg via INTRAVENOUS

## 2021-03-11 MED ORDER — TROPICAMIDE 1 % OP SOLN
1.0000 [drp] | OPHTHALMIC | Status: AC | PRN
  Administered 2021-03-11 (×3): 1 [drp] via OPHTHALMIC
  Filled 2021-03-11: qty 2

## 2021-03-11 MED ORDER — LIDOCAINE HCL (PF) 1 % IJ SOLN
INTRAOCULAR | Status: DC | PRN
  Administered 2021-03-11: 1 mL via OPHTHALMIC

## 2021-03-11 MED ORDER — STERILE WATER FOR IRRIGATION IR SOLN
Status: DC | PRN
  Administered 2021-03-11: 250 mL

## 2021-03-11 MED ORDER — TETRACAINE HCL 0.5 % OP SOLN
1.0000 [drp] | OPHTHALMIC | Status: AC | PRN
  Administered 2021-03-11 (×3): 1 [drp] via OPHTHALMIC

## 2021-03-11 MED ORDER — POVIDONE-IODINE 5 % OP SOLN
OPHTHALMIC | Status: DC | PRN
  Administered 2021-03-11: 1 via OPHTHALMIC

## 2021-03-11 MED ORDER — SODIUM HYALURONATE 10 MG/ML IO SOLUTION
PREFILLED_SYRINGE | INTRAOCULAR | Status: DC | PRN
  Administered 2021-03-11: 0.85 mL via INTRAOCULAR

## 2021-03-11 MED ORDER — BSS IO SOLN
INTRAOCULAR | Status: DC | PRN
  Administered 2021-03-11: 15 mL via INTRAOCULAR

## 2021-03-11 MED ORDER — TETRACAINE 0.5 % OP SOLN OPTIME - NO CHARGE
OPHTHALMIC | Status: DC | PRN
  Administered 2021-03-11: 2 [drp] via OPHTHALMIC

## 2021-03-11 SURGICAL SUPPLY — 16 items
CATARACT SUITE SIGHTPATH (MISCELLANEOUS) ×2 IMPLANT
CLOTH BEACON ORANGE TIMEOUT ST (SAFETY) ×2 IMPLANT
EYE SHIELD UNIVERSAL CLEAR (GAUZE/BANDAGES/DRESSINGS) ×1 IMPLANT
FEE CATARACT SUITE SIGHTPATH (MISCELLANEOUS) ×1 IMPLANT
GLOVE SURG UNDER POLY LF SZ6.5 (GLOVE) ×1 IMPLANT
GLOVE SURG UNDER POLY LF SZ7 (GLOVE) ×1 IMPLANT
LENS IOL RAYNER 21.0 (Intraocular Lens) ×2 IMPLANT
LENS IOL RAYONE EMV 21.0 (Intraocular Lens) IMPLANT
NDL HYPO 18GX1.5 BLUNT FILL (NEEDLE) ×1 IMPLANT
NEEDLE HYPO 18GX1.5 BLUNT FILL (NEEDLE) ×2 IMPLANT
PAD ARMBOARD 7.5X6 YLW CONV (MISCELLANEOUS) ×2 IMPLANT
RING MALYGIN 7.0 (MISCELLANEOUS) IMPLANT
SYR TB 1ML LL NO SAFETY (SYRINGE) ×2 IMPLANT
TAPE SURG TRANSPORE 1 IN (GAUZE/BANDAGES/DRESSINGS) IMPLANT
TAPE SURGICAL TRANSPORE 1 IN (GAUZE/BANDAGES/DRESSINGS) ×2
WATER STERILE IRR 250ML POUR (IV SOLUTION) ×2 IMPLANT

## 2021-03-11 NOTE — Op Note (Signed)
Date of procedure: 03/11/21  Pre-operative diagnosis: Visually significant age-related combined cataract, Left Eye (H25.812)  Post-operative diagnosis: Visually significant age-related combined cataract, Left Eye (H25.812)  Procedure: Removal of cataract via phacoemulsification and insertion of intra-ocular lens Rayner [id number] +21.0D into the capsular bag of the Left Eye  Attending surgeon: Gerda Diss. Nneoma Harral, MD, MA  Anesthesia: MAC, Topical Akten  Complications: None  Estimated Blood Loss: <64m (minimal)  Specimens: None  Implants: As above  Indications:  Visually significant age-related cataract, Left Eye  Procedure:  The patient was seen and identified in the pre-operative area. The operative eye was identified and dilated.  The operative eye was marked.  Topical anesthesia was administered to the operative eye.     The patient was then to the operative suite and placed in the supine position.  A timeout was performed confirming the patient, procedure to be performed, and all other relevant information.   The patient's face was prepped and draped in the usual fashion for intra-ocular surgery.  A lid speculum was placed into the operative eye and the surgical microscope moved into place and focused.  An inferotemporal paracentesis was created using a 20 gauge paracentesis blade.  Shugarcaine was injected into the anterior chamber.  Viscoelastic was injected into the anterior chamber.  A temporal clear-corneal main wound incision was created using a 2.445mmicrokeratome.  A continuous curvilinear capsulorrhexis was initiated using an irrigating cystitome and completed using capsulorrhexis forceps.  Hydrodissection and hydrodeliniation were performed.  Viscoelastic was injected into the anterior chamber.  A phacoemulsification handpiece and a chopper as a second instrument were used to remove the nucleus and epinucleus. The irrigation/aspiration handpiece was used to remove any remaining  cortical material.   The capsular bag was reinflated with viscoelastic, checked, and found to be intact.  The intraocular lens was inserted into the capsular bag.  The irrigation/aspiration handpiece was used to remove any remaining viscoelastic.  The clear corneal wound and paracentesis wounds were then hydrated and checked with Weck-Cels to be watertight.  Maxitrol was instilled in the eye. The lid-speculum was removed.  The drape was removed.  The patient's face was cleaned with a wet and dry 4x4.    A clear shield was taped over the eye. The patient was taken to the post-operative care unit in good condition, having tolerated the procedure well.  Post-Op Instructions: [facility] and will receive all other orders and instructions.

## 2021-03-11 NOTE — Anesthesia Preprocedure Evaluation (Signed)
Anesthesia Evaluation  Patient identified by MRN, date of birth, ID band Patient awake    Reviewed: Allergy & Precautions, NPO status , Patient's Chart, lab work & pertinent test results  History of Anesthesia Complications (+) PONV and history of anesthetic complications  Airway Mallampati: II  TM Distance: >3 FB Neck ROM: Full    Dental  (+) Dental Advisory Given, Missing   Pulmonary neg pulmonary ROS,    Pulmonary exam normal breath sounds clear to auscultation       Cardiovascular Exercise Tolerance: Good hypertension, Pt. on medications Normal cardiovascular exam Rhythm:Regular Rate:Normal     Neuro/Psych  Headaches,  Neuromuscular disease negative psych ROS   GI/Hepatic negative GI ROS, Neg liver ROS,   Endo/Other  diabetes, Well Controlled, Type 2, Oral Hypoglycemic AgentsMorbid obesity  Renal/GU negative Renal ROS  negative genitourinary   Musculoskeletal  (+) Arthritis , Osteoarthritis,    Abdominal   Peds negative pediatric ROS (+)  Hematology negative hematology ROS (+)   Anesthesia Other Findings Left leg pain  Reproductive/Obstetrics negative OB ROS                            Anesthesia Physical Anesthesia Plan  ASA: 3  Anesthesia Plan: MAC   Post-op Pain Management: Minimal or no pain anticipated   Induction:   PONV Risk Score and Plan:   Airway Management Planned: Nasal Cannula and Natural Airway  Additional Equipment:   Intra-op Plan:   Post-operative Plan:   Informed Consent: I have reviewed the patients History and Physical, chart, labs and discussed the procedure including the risks, benefits and alternatives for the proposed anesthesia with the patient or authorized representative who has indicated his/her understanding and acceptance.     Dental advisory given  Plan Discussed with: CRNA and Surgeon  Anesthesia Plan Comments:          Anesthesia Quick Evaluation

## 2021-03-11 NOTE — Anesthesia Postprocedure Evaluation (Signed)
Anesthesia Post Note  Patient: [name]  Procedure(s) Performed: CATARACT EXTRACTION PHACO AND INTRAOCULAR LENS PLACEMENT (IOC) (Left: Eye)  Patient location during evaluation: Phase II Anesthesia Type: MAC Level of consciousness: awake and alert and oriented Pain management: pain level controlled Vital Signs Assessment: post-procedure vital signs reviewed and stable Respiratory status: spontaneous breathing, nonlabored ventilation and respiratory function stable Cardiovascular status: stable and blood pressure returned to baseline Postop Assessment: no apparent nausea or vomiting Anesthetic complications: no   No notable events documented.   Last Vitals:  Vitals:   03/11/21 0939 03/11/21 1118  BP: (!) 127/48 139/73  Pulse: 73 78  Resp: 15 18  Temp: 36.7 C 36.6 C  SpO2: 99% 99%    Last Pain:  Vitals:   03/11/21 1118  TempSrc: Oral  PainSc: 0-No pain                 Tamra Koos C Royetta Probus

## 2021-03-11 NOTE — Transfer of Care (Signed)
Immediate Anesthesia Transfer of Care Note  Patient: [name]  Procedure(s) Performed: CATARACT EXTRACTION PHACO AND INTRAOCULAR LENS PLACEMENT (IOC) (Left: Eye)  Patient Location: Short Stay  Anesthesia Type:MAC  Level of Consciousness: awake, alert  and oriented  Airway & Oxygen Therapy: Patient Spontanous Breathing  Post-op Assessment: Report given to RN and Post -op Vital signs reviewed and stable  Post vital signs: Reviewed and stable  Last Vitals:  Vitals Value Taken Time  BP    Temp    Pulse    Resp    SpO2      Last Pain:  Vitals:   03/11/21 0939  TempSrc: Oral  PainSc: 0-No pain         Complications: No notable events documented.

## 2021-03-11 NOTE — Discharge Instructions (Addendum)
Please discharge patient when stable, will follow up today with Dr. Wrzosek at the [facility] [place] office immediately following discharge.  Leave shield in place until visit.  All paperwork with discharge instructions will be given at the office.  [facility] Address:  [address]  [place], [place] [address]  

## 2021-03-11 NOTE — Anesthesia Procedure Notes (Signed)
Procedure Name: MAC Date/Time: 03/11/2021 10:55 AM Performed by: Orlie Dakin, CRNA Pre-anesthesia Checklist: Patient identified, Emergency Drugs available, Suction available and Patient being monitored Patient Re-evaluated:Patient Re-evaluated prior to induction Oxygen Delivery Method: Nasal cannula Placement Confirmation: positive ETCO2

## 2021-03-11 NOTE — Interval H&P Note (Signed)
History and Physical Interval Note:  03/11/2021 10:44 AM  [name]  has presented today for surgery, with the diagnosis of combined forms age related cataract; left.  The various methods of treatment have been discussed with the patient and family. After consideration of risks, benefits and other options for treatment, the patient has consented to  Procedure(s) with comments: CATARACT EXTRACTION PHACO AND INTRAOCULAR LENS PLACEMENT ([place]) (Left) - left as a surgical intervention.  The patient's history has been reviewed, patient examined, no change in status, stable for surgery.  I have reviewed the patient's chart and labs.  Questions were answered to the patient's satisfaction.     Baruch Goldmann

## 2021-03-13 ENCOUNTER — Encounter (HOSPITAL_COMMUNITY): Payer: Self-pay | Admitting: Ophthalmology

## 2021-03-13 ENCOUNTER — Ambulatory Visit: Payer: Medicare HMO | Admitting: Family Medicine

## 2021-03-25 DIAGNOSIS — E1169 Type 2 diabetes mellitus with other specified complication: Secondary | ICD-10-CM | POA: Diagnosis not present

## 2021-03-25 DIAGNOSIS — E785 Hyperlipidemia, unspecified: Secondary | ICD-10-CM | POA: Diagnosis not present

## 2021-03-25 DIAGNOSIS — E119 Type 2 diabetes mellitus without complications: Secondary | ICD-10-CM | POA: Diagnosis not present

## 2021-03-26 ENCOUNTER — Ambulatory Visit (INDEPENDENT_AMBULATORY_CARE_PROVIDER_SITE_OTHER): Payer: Medicare HMO | Admitting: Family Medicine

## 2021-03-26 ENCOUNTER — Other Ambulatory Visit: Payer: Self-pay

## 2021-03-26 VITALS — BP 112/62 | Ht 67.0 in | Wt 280.0 lb

## 2021-03-26 DIAGNOSIS — Z79899 Other long term (current) drug therapy: Secondary | ICD-10-CM | POA: Diagnosis not present

## 2021-03-26 DIAGNOSIS — E785 Hyperlipidemia, unspecified: Secondary | ICD-10-CM

## 2021-03-26 DIAGNOSIS — E1169 Type 2 diabetes mellitus with other specified complication: Secondary | ICD-10-CM

## 2021-03-26 DIAGNOSIS — E119 Type 2 diabetes mellitus without complications: Secondary | ICD-10-CM

## 2021-03-26 LAB — BASIC METABOLIC PANEL
BUN/Creatinine Ratio: 10 — ABNORMAL LOW (ref 12–28)
BUN: 12 mg/dL (ref 8–27)
CO2: 25 mmol/L (ref 20–29)
Calcium: 10.1 mg/dL (ref 8.7–10.3)
Chloride: 99 mmol/L (ref 96–106)
Creatinine, Ser: 1.26 mg/dL — ABNORMAL HIGH (ref 0.57–1.00)
Glucose: 97 mg/dL (ref 70–99)
Potassium: 4.3 mmol/L (ref 3.5–5.2)
Sodium: 139 mmol/L (ref 134–144)
eGFR: 46 mL/min/{1.73_m2} — ABNORMAL LOW (ref >59)

## 2021-03-26 LAB — LIPID PANEL
Chol/HDL Ratio: 3.8 ratio (ref 0.0–4.4)
Cholesterol, Total: 178 mg/dL (ref 100–199)
HDL: 47 mg/dL (ref >39)
LDL Chol Calc (NIH): 113 mg/dL — ABNORMAL HIGH (ref 0–99)
Triglycerides: 98 mg/dL (ref 0–149)
VLDL Cholesterol Cal: 18 mg/dL (ref 5–40)

## 2021-03-26 LAB — HEMOGLOBIN A1C
Est. average glucose Bld gHb Est-mCnc: 163 mg/dL
Hgb A1c MFr Bld: 7.3 % — ABNORMAL HIGH (ref 4.8–5.6)

## 2021-03-26 MED ORDER — METFORMIN HCL 500 MG PO TABS: ORAL_TABLET | ORAL | 1 refills | Status: DC

## 2021-03-26 MED ORDER — TIRZEPATIDE 10 MG/0.5ML ~~LOC~~ SOAJ: 10.0000 mg | SUBCUTANEOUS | 2 refills | Status: DC

## 2021-03-26 MED ORDER — ROSUVASTATIN CALCIUM 40 MG PO TABS: 40.0000 mg | ORAL_TABLET | Freq: Every day | ORAL | 3 refills | Status: DC

## 2021-03-26 NOTE — Progress Notes (Signed)
? ?  Subjective:  ? ? Patient ID: [name], female    DOB: 07-Oct-1950, 71 y.o.   MRN: [id number] ? ?Diabetes ?She presents for her follow-up diabetic visit. She has type 2 diabetes mellitus. Risk factors for coronary artery disease include diabetes mellitus, dyslipidemia, hypertension and post-menopausal. Current diabetic treatment includes oral agent (monotherapy) (mounjaro).  ?Type 2 diabetes mellitus with hemoglobin A1c goal of less than 7.5% (HCC) - Plan: Hemoglobin A1c, Lipid Profile, Basic Metabolic Panel (BMET), Urine Microalbumin w/creat. ratio ? ?Hyperlipidemia associated with type 2 diabetes mellitus ([facility]) - Plan: Hemoglobin A1c, Lipid Profile, Basic Metabolic Panel (BMET), Urine Microalbumin w/creat. ratio ? ?Uncontrolled type 2 diabetes mellitus with hyperglycemia (HCC) - Plan: rosuvastatin (CRESTOR) 40 MG tablet, Hemoglobin A1c, Lipid Profile, Basic Metabolic Panel (BMET), Urine Microalbumin w/creat. ratio ? ?High risk medication use - Plan: Hemoglobin A1c, Lipid Profile, Basic Metabolic Panel (BMET), Urine Microalbumin w/creat. ratio ? ?Morbid obesity ([place]), Chronic ? ?Patient recently had labs we went over those today improvement with the diabetes ?Also patient compliant with medicine her weight is coming down she is not eating as much ? ? ?Review of Systems ? ?   ?Objective:  ? Physical Exam ?General-in no acute distress ?Eyes-no discharge ?Lungs-respiratory rate normal, CTA ?CV-no murmurs,RRR ?Extremities skin warm dry no edema ?Neuro grossly normal ?Behavior normal, alert ? ? ? ? ?   ?Assessment & Plan:  ?1. Type 2 diabetes mellitus with hemoglobin A1c goal of less than 7.5% (HCC) ?Diabetes much improved compared to where it is bump up dose of Mounjaro to try to help continued weight loss which will help her in the long run she is not having any side effects or problems ?Cholesterol profile much better than what it was but still has room for improvement but we will not add additional medicines  currently ?- Hemoglobin A1c ?- Lipid Profile ?- Basic Metabolic Panel (BMET) ?- Urine Microalbumin w/creat. ratio ? ?2. Hyperlipidemia associated with type 2 diabetes mellitus ([place]) ?See discussion above ?- Hemoglobin A1c ?- Lipid Profile ?- Basic Metabolic Panel (BMET) ?- Urine Microalbumin w/creat. ratio ?- rosuvastatin (CRESTOR) 40 MG tablet; Take 1 tablet (40 mg total) by mouth daily.  Dispense: 90 tablet; Refill: 3 ?- Hemoglobin A1c ?- Lipid Profile ?- Basic Metabolic Panel (BMET) ?- Urine Microalbumin w/creat. ratio ? ?4. High risk medication use ?Medication as discussed ?- Hemoglobin A1c ?- Lipid Profile ?- Basic Metabolic Panel (BMET) ?- Urine Microalbumin w/creat. ratio ? ?5. Morbid obesity ([place]) ?Portion control regular activity her treatment for her diabetes with Darcel Bayley should help her weight come down ? ?Follow-up in 4 months ? ? ?

## 2021-03-26 NOTE — Patient Instructions (Signed)
Overall you are doing much better ?We are increasing the Mounjaro to 10 mg once weekly ?If you feel you are having any problems tolerating this please let me know ?We will see you back in 3 to 4 months ?Please do your blood work approximately 1 week before your office visit ? ?TakeCare-Dr. Nicki Reaper ?

## 2021-04-01 ENCOUNTER — Ambulatory Visit (INDEPENDENT_AMBULATORY_CARE_PROVIDER_SITE_OTHER): Payer: Medicare HMO | Admitting: Pharmacist

## 2021-04-01 DIAGNOSIS — I10 Essential (primary) hypertension: Secondary | ICD-10-CM

## 2021-04-01 DIAGNOSIS — E1169 Type 2 diabetes mellitus with other specified complication: Secondary | ICD-10-CM

## 2021-04-01 DIAGNOSIS — E119 Type 2 diabetes mellitus without complications: Secondary | ICD-10-CM

## 2021-04-01 NOTE — Patient Instructions (Signed)
[name], ? ?It was great to talk to you today! ? ?Please call me with any questions or concerns. ? ?Visit Information ? ?Following are the goals we discussed today:  ? Goals Addressed   ? ?  ?  ?  ?  ? This Visit's Progress  ?  Medication Management     ?  Patient Goals/Self-Care Activities ?Patient will:  ?Take medications as prescribed ?Check blood sugar three times a day at the following times: fasting (at least 8 hours since last food consumption), 5-15 minutes before lunch, bedtime, and whenever patient experiences symptoms of hypo/hyperglycemia, document, and provide at future appointments ?Collaborate with provider on medication access solutions ?Engage in dietary modifications by less frequent dining out, decreased fat intake, and fewer sweetened foods & beverages ? ? ? ? ? ? ?  ? ?  ?  ? ?Follow-up plan: Next PCP appointment scheduled for: 07/26/21 ? ?The patient verbalized understanding of instructions, educational materials, and care plan provided today and declined offer to receive copy of patient instructions, educational materials, and care plan.  ? ?Please call the care guide team at [phone] if you need to cancel or reschedule your appointment.  ? ?Kennon Holter, PharmD, BCACP, CPP ?Clinical Pharmacist Practitioner ?[place] ?[phone]  ?

## 2021-04-01 NOTE — Chronic Care Management (AMB) (Signed)
? ? ?Chronic Care Management ?Pharmacy Note ? ?04/01/2021 ?Name:  [name] MRN:  [id number] DOB:  1950-03-24 ? ?Summary: ?Type 2 Diabetes ?Uncontrolled but improving; Most recent A1c improved to 7.3% which remains slightly above goal of <7% per ADA guidelines ?Recent changes: primary care provider decreased metformin to 500 mg by mouth twice daily for unknown reason; primary care provider increased Mounjaro to 10 mg subcutaneously once weekly ?Patient reports that she has two more doses of Mounjaro 7.5 mg and then will increase to 10 mg  ?Continue metformin 500 mg by mouth twice daily ?Continue tirzepatide (Mounjaro) 10 mg subcutaneously once weekly ? ?Hyperlipidemia ?Uncontrolled but improving. LDL decreased from 256>113 after restarting high intensity statin; however, LDL remains above goal of <70 due to very high risk given 10-year risk >20% per 2020 AACE/ACE guidelines. Triglycerides now at goal of <150 per 2020 AACE/ACE guidelines. ?Continue rosuvastatin 40 mg by mouth daily ?Consider addition of ezetimibe, Nexletol/Nexlizet, or PCSK9i to achieve LDL goal ?Recheck lipid panel at next primary care provider visit ? ?Overweight/Obesity ?Baseline weight: 285 lbs; most recent weight: 280 lbs ?Current meal patterns: follows with dietician ?Continue to titrate Mounjaro as able to tolerate  ? ?Subjective: ?[name] is an 71 y.o. year old female who is a primary patient of Luking, Elayne Snare, MD.  The CCM team was consulted for assistance with disease management and care coordination needs.   ? ?Engaged with patient by telephone for follow up visit in response to provider referral for pharmacy case management and/or care coordination services.  ? ?Consent to Services:  ?The patient was given information about Chronic Care Management services, agreed to services, and gave verbal consent prior to initiation of services.  Please see initial visit note for detailed documentation.  ? ?Patient Care Team: ?Kathyrn Drown, MD as PCP - General (Family Medicine) ?Satira Sark, MD as PCP - Cardiology (Cardiology) ?Pieter Partridge, DO as Consulting Physician (Neurology) ?Eloise Harman, DO as Consulting Physician (Internal Medicine) ?Beryle Lathe, [facility] (Pharmacist) ? ?Objective: ? ?Lab Results  ?Component Value Date  ? CREATININE 1.26 (H) 03/25/2021  ? CREATININE 1.08 (H) 12/03/2020  ? CREATININE 0.95 09/03/2020  ? ? ?Lab Results  ?Component Value Date  ? HGBA1C 7.3 (H) 03/25/2021  ? ?Last diabetic Eye exam:  ?Lab Results  ?Component Value Date/Time  ? HMDIABEYEEXA No Retinopathy 10/17/2020 12:00 AM  ?  ?Last diabetic Foot exam: No results found for: HMDIABFOOTEX  ? ?   ?Component Value Date/Time  ? CHOL 178 03/25/2021 1406  ? TRIG 98 03/25/2021 1406  ? HDL 47 03/25/2021 1406  ? CHOLHDL 3.8 03/25/2021 1406  ? CHOLHDL 4.6 12/27/2013 0710  ? VLDL 22 12/27/2013 0710  ? LDLCALC 113 (H) 03/25/2021 1406  ? ? ?Hepatic Function Latest Ref Rng & Units 09/03/2020 10/24/2019 11/18/2018  ?Total Protein 6.0 - 8.5 g/dL 7.8 7.9 7.8  ?Albumin 3.8 - 4.8 g/dL 4.0 4.1 4.2  ?AST 0 - 40 IU/L _0 ?ALT 0 - 32 IU/L _1 ?Alk Phosphatase 44 - 121 IU/L 155(H) 182(H) 195(H)  ?Total Bilirubin 0.0 - 1.2 mg/dL 0.3 0.3 0.3  ?Bilirubin, Direct 0.00 - 0.40 mg/dL <0.10 - 0.08  ? ? ?Lab Results  ?Component Value Date/Time  ? TSH 1.550 09/03/2020 10:16 AM  ? FREET4 1.15 09/03/2020 10:16 AM  ? ? ?CBC Latest Ref Rng & Units 04/09/2015 09/14/2013 09/12/2010  ?WBC 3.4 - 10.8 x10E3/uL 7.7 10.0 10.5  ?  Hemoglobin 11.1 - 15.9 g/dL 12.7 14.1 10.4(L)  ?Hematocrit 34.0 - 46.6 % 38.8 41.8 31.3(L)  ?Platelets 150 - 379 x10E3/uL 257 PLATELET CLUMPS NOTED ON SMEAR, COUNT APPEARS ADEQUATE 229  ? ? ?No results found for: VD25OH ? ?Clinical ASCVD: No  ?The 10-year ASCVD risk score (Arnett DK, et al., 2019) is: 18.3% ?  Values used to calculate the score: ?    Age: 1 years ?    Sex: Female ?    Is Non-Hispanic African American: Yes ?    Diabetic: Yes ?     Tobacco smoker: No ?    Systolic Blood Pressure: 034 mmHg ?    Is BP treated: No ?    HDL Cholesterol: 47 mg/dL ?    Total Cholesterol: 178 mg/dL   ? ? ?Social History  ? ?Tobacco Use  ?Smoking Status Never  ?Smokeless Tobacco Never  ? ?BP Readings from Last 3 Encounters:  ?03/26/21 112/62  ?03/11/21 139/73  ?02/25/21 126/88  ? ?Pulse Readings from Last 3 Encounters:  ?03/11/21 78  ?02/25/21 72  ?12/24/20 68  ? ?Wt Readings from Last 3 Encounters:  ?03/26/21 280 lb (127 kg)  ?03/05/21 292 lb 8.8 oz (132.7 kg)  ?01/29/21 292 lb 9.6 oz (132.7 kg)  ? ? ?Assessment: Review of patient past medical history, allergies, medications, health status, including review of consultants reports, laboratory and other test data, was performed as part of comprehensive evaluation and provision of chronic care management services.  ? ?SDOH:  (Social Determinants of Health) assessments and interventions performed:  ? ? ?CCM Care Plan ? ?Allergies  ?Allergen Reactions  ? Penicillins Itching  ?  Has patient had a PCN reaction causing immediate rash, facial/tongue/throat swelling, SOB or lightheadedness with hypotension: No ?Has patient had a PCN reaction causing severe rash involving mucus membranes or skin necrosis: No ?Has patient had a PCN reaction that required hospitalization: No ?Has patient had a PCN reaction occurring within the last 10 years: No ?If all of the above answers are "NO", then may proceed with Cephalosporin use. ?  ? Tramadol Itching  ? Hydrocodone Itching  ? ? ?Medications Reviewed Today   ? ? Reviewed by Beryle Lathe, [facility] (Pharmacist) on 04/01/21 at 1041  Med List Status: <None>  ? ?Medication Order Taking? Sig Documenting Provider Last Dose Status Informant  ?Accu-Chek Softclix Lancets lancets [id number] Yes  [provider] Taking Active   ?Blood Glucose Monitoring Suppl (ACCU-CHEK GUIDE ME) w/Device KIT [id number] Yes  [provider] Taking Active   ?glucose blood test strip [id number]  Yes Use as instructed to test blood glucose three time daily Luking, Elayne Snare, MD Taking Active Self  ?metFORMIN (GLUCOPHAGE) 500 MG tablet [id number] Yes One 2 times a day  ?Patient taking differently: Take 500 mg by mouth 2 (two) times daily with a meal.  ? Kathyrn Drown, MD Taking Active Self  ?rosuvastatin (CRESTOR) 40 MG tablet [id number] Yes Take 1 tablet (40 mg total) by mouth daily. Kathyrn Drown, MD Taking Active   ?tirzepatide [facility]) 10 MG/0.5ML Pen [id number] Yes Inject 10 mg into the skin once a week. Kathyrn Drown, MD Taking Active   ? ?  ?  ? ?  ? ? ?Patient Active Problem List  ? Diagnosis Date Noted  ? Vaginal itching 10/08/2020  ? Yeast vaginitis 10/08/2020  ? Encounter for screening colonoscopy 12/13/2019  ? Occipital neuralgia of left side 06/24/2019  ? Chronic intractable headache 06/24/2019  ? 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 Special screening for malignant neoplasms, colon 12/22/2018  ? Type 2 diabetes mellitus with diabetic neuropathy, unspecified ([place]) 02/04/2018  ? Carpal tunnel syndrome 05/16/2015  ? Morbid obesity ([place]) 03/13/2015  ? Elevated alkaline phosphatase level 09/15/2013  ? HTN (hypertension) 09/14/2013  ? Type 2 diabetes mellitus with hemoglobin A1c goal of less than 7.5% ([place]) 10/04/2012  ? Lumbago with sciatica of left side 10/22/2010  ? Difficulty in walking(719.7) 10/16/2010  ? 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 Acquired trigger finger 08/15/2010  ? OA (osteoarthritis) of knee 08/15/2010  ? KNEE PAIN 05/26/2007  ? S/P hip replacement, left 05/26/2007  ? DEGENERATIVE JOINT DISEASE, LEFT HIP 12/03/2006  ? ? ?Immunization History  ?Administered Date(s) Administered  ? Fluad Quad(high Dose 65+) 10/24/2019, 10/31/2020  ? Influenza Split 10/04/2012, 09/13/2013  ? Influenza,inj,Quad PF,6+ Mos 09/22/2014, 10/05/2015, 11/04/2017  ? Influenza-Unspecified 09/14/2018  ? Moderna Sars-Covid-2 Vaccination 03/15/2019, 04/12/2019  ? Pneumococcal  Conjugate-13 04/24/2016  ? Pneumococcal Polysaccharide-23 06/14/2013, 10/24/2019  ? Td 06/14/2013  ? Zoster Recombinat (Shingrix) 02/03/2018  ? ? ?Conditions to be addressed/monitored: ?HTN, HLD, DMII, and obesi

## 2021-04-12 DIAGNOSIS — E785 Hyperlipidemia, unspecified: Secondary | ICD-10-CM | POA: Diagnosis not present

## 2021-04-12 DIAGNOSIS — E1169 Type 2 diabetes mellitus with other specified complication: Secondary | ICD-10-CM

## 2021-04-12 DIAGNOSIS — I10 Essential (primary) hypertension: Secondary | ICD-10-CM

## 2021-04-12 DIAGNOSIS — E119 Type 2 diabetes mellitus without complications: Secondary | ICD-10-CM

## 2021-04-16 ENCOUNTER — Ambulatory Visit: Payer: Medicare HMO | Admitting: Nutrition

## 2021-05-20 DIAGNOSIS — Z0100 Encounter for examination of eyes and vision without abnormal findings: Secondary | ICD-10-CM | POA: Diagnosis not present

## 2021-06-17 DIAGNOSIS — H2013 Chronic iridocyclitis, bilateral: Secondary | ICD-10-CM | POA: Diagnosis not present

## 2021-06-17 DIAGNOSIS — H43822 Vitreomacular adhesion, left eye: Secondary | ICD-10-CM | POA: Diagnosis not present

## 2021-06-23 IMAGING — DX DG HAND COMPLETE 3+V*L*
3 series · 3 of 3 positions shown · non-contrast
Comparison: None.

CLINICAL DATA: Multifocal joint pain.

EXAM:
LEFT HAND - COMPLETE 3+ VIEW

[hand pa]
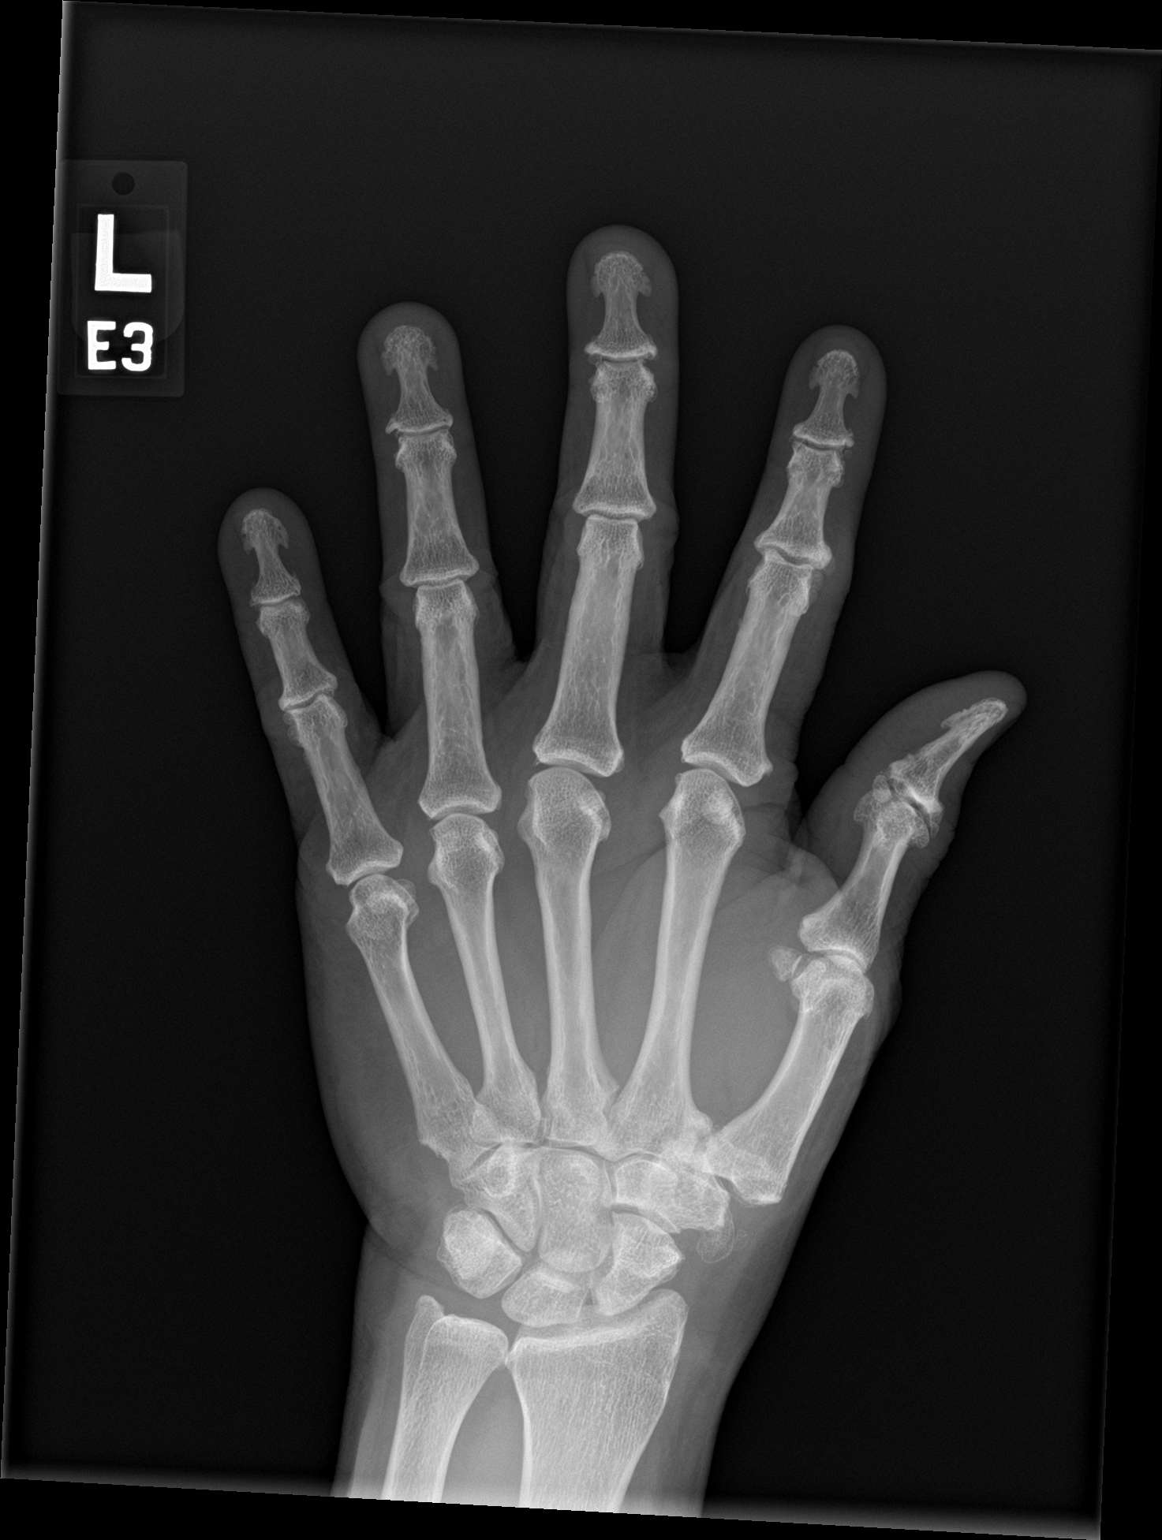

[hand obl]
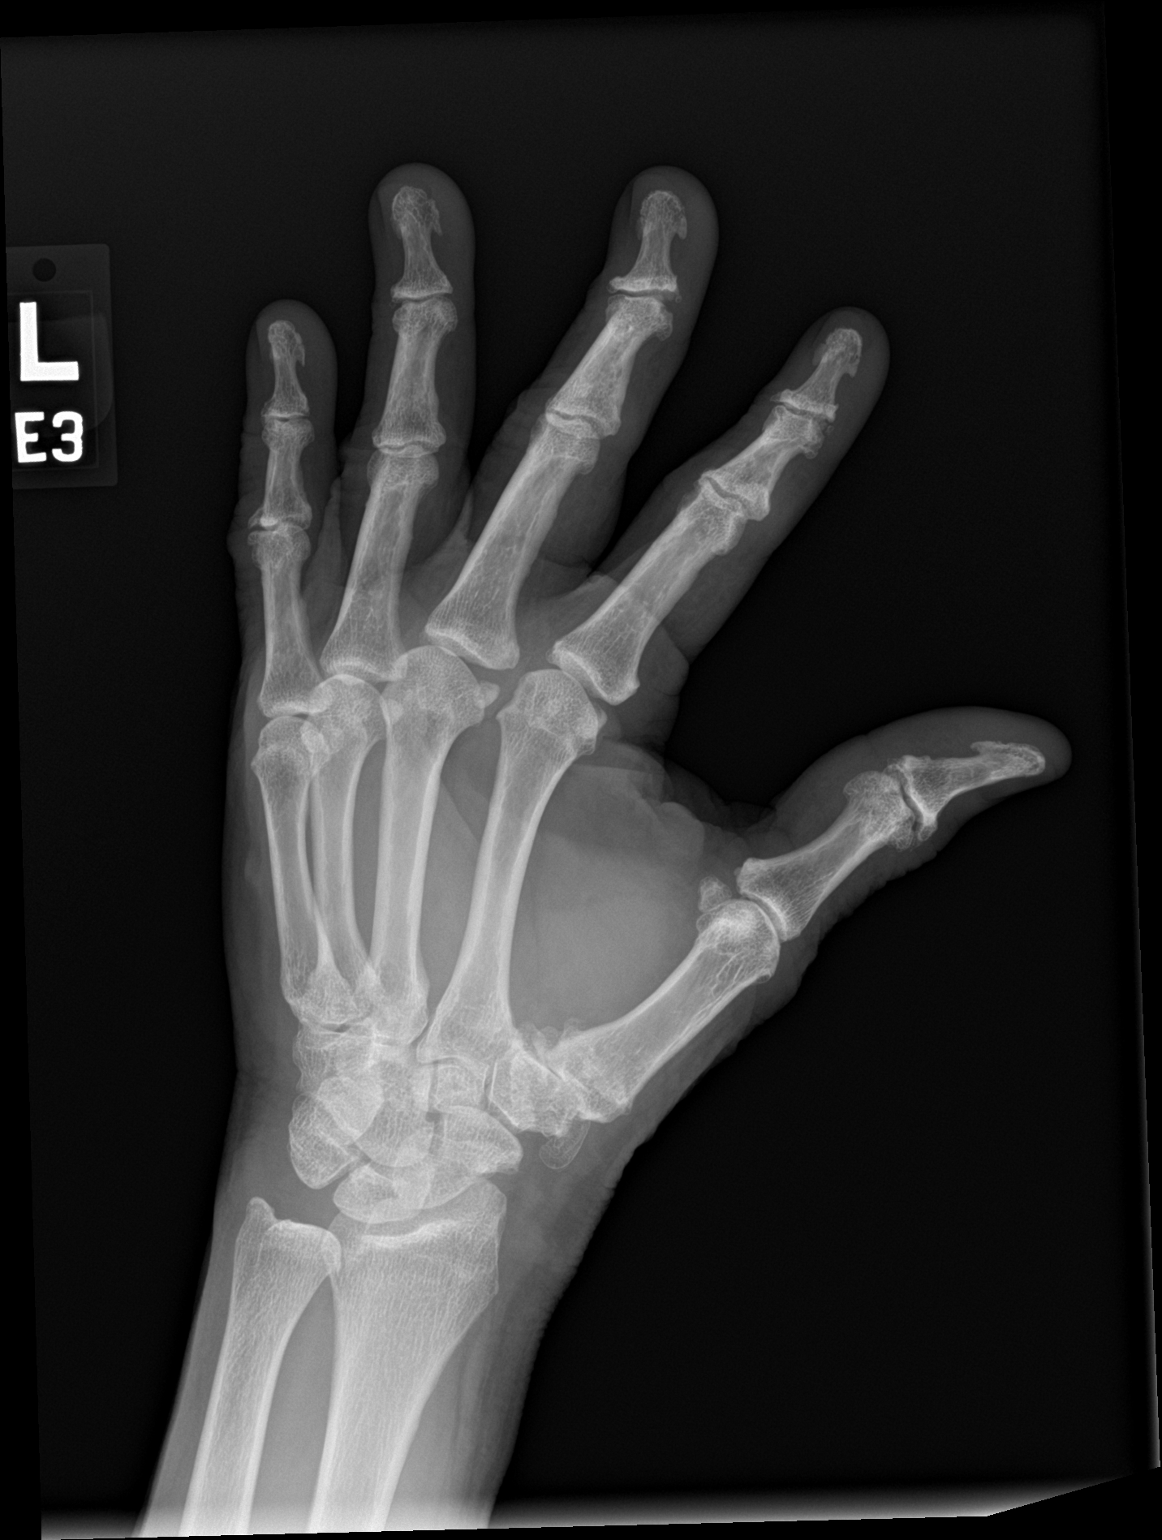

[hand lat]
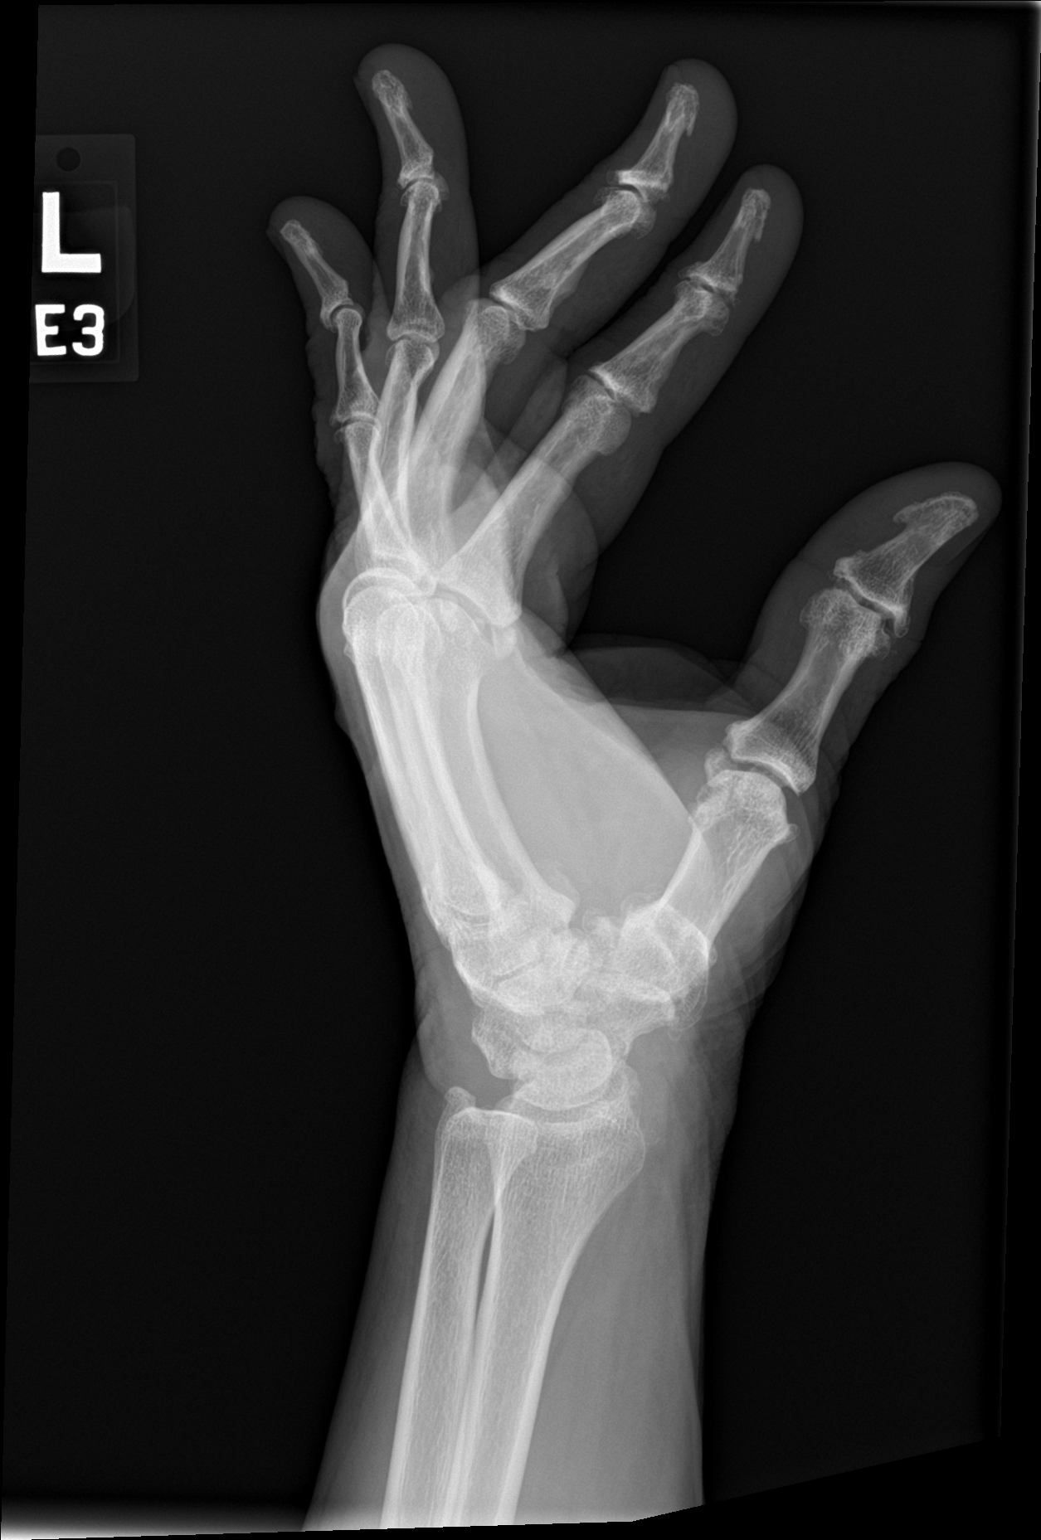

[3 of 3 positions shown; findings below may reference images not displayed]

FINDINGS: Scattered joint space narrowing and osteophytosis are worst at the
first CMC joint and IP joint of the thumb. Mineralization and
alignment are normal. No erosion or periostitis. No fracture or
dislocation. No chondrocalcinosis.
IMPRESSION: Moderate to moderately severe multifocal osteoarthritis.

No acute finding.

## 2021-07-01 DIAGNOSIS — H2013 Chronic iridocyclitis, bilateral: Secondary | ICD-10-CM | POA: Diagnosis not present

## 2021-07-01 DIAGNOSIS — H43822 Vitreomacular adhesion, left eye: Secondary | ICD-10-CM | POA: Diagnosis not present

## 2021-07-08 ENCOUNTER — Other Ambulatory Visit (HOSPITAL_COMMUNITY): Payer: Self-pay | Admitting: Family Medicine

## 2021-07-08 DIAGNOSIS — Z1231 Encounter for screening mammogram for malignant neoplasm of breast: Secondary | ICD-10-CM

## 2021-07-11 ENCOUNTER — Telehealth: Payer: Self-pay | Admitting: Family Medicine

## 2021-07-11 MED ORDER — TIRZEPATIDE 10 MG/0.5ML ~~LOC~~ SOAJ: 10.0000 mg | SUBCUTANEOUS | 2 refills | Status: DC

## 2021-07-11 NOTE — Telephone Encounter (Signed)
Patient called in requesting a prescription be sent to [facility] in [place] she is  stating that [facility] in [place] doesn't have any tirzepatide [facility]) 10 MG/0.5ML Pen [[id number]]    Order Details Dose: 10 mg Route: Subcutaneous Frequency: Weekly  Dispense Quantity: 6 mL Refills: 2   Note to Pharmacy: New dose when done with 7.5       Sig: Inject 10 mg into the skin once a week.       Start Date: 03/26/21 End Date: --  Written Date: 03/26/21 Expiration Date: 03/26/22  Providers  Authorizing Provider:   Kathyrn Drown, [address] [place], [place] [address]  Phone:  [phone]   Fax:  [phone]  DEA #:  [id number]   NPI:  [id number]      Ordering User:  Kathyrn Drown, [place], [place] - [id number]

## 2021-07-11 NOTE — Telephone Encounter (Signed)
Patient informed medications sent in and to get labs drawn before appointment in July. Verbalized understanding.

## 2021-07-11 NOTE — Telephone Encounter (Signed)
May have a refill sent up to [facility] in [place]. She needs to do her blood work before her follow-up visit in July thanks

## 2021-07-25 DIAGNOSIS — H2013 Chronic iridocyclitis, bilateral: Secondary | ICD-10-CM | POA: Diagnosis not present

## 2021-07-25 DIAGNOSIS — H43822 Vitreomacular adhesion, left eye: Secondary | ICD-10-CM | POA: Diagnosis not present

## 2021-07-25 DIAGNOSIS — H26491 Other secondary cataract, right eye: Secondary | ICD-10-CM | POA: Diagnosis not present

## 2021-07-26 ENCOUNTER — Encounter: Payer: Self-pay | Admitting: Family Medicine

## 2021-07-26 ENCOUNTER — Ambulatory Visit (INDEPENDENT_AMBULATORY_CARE_PROVIDER_SITE_OTHER): Payer: Medicare HMO | Admitting: Family Medicine

## 2021-07-26 VITALS — BP 104/69 | HR 67 | Wt 277.8 lb

## 2021-07-26 DIAGNOSIS — E119 Type 2 diabetes mellitus without complications: Secondary | ICD-10-CM | POA: Diagnosis not present

## 2021-07-26 DIAGNOSIS — E1169 Type 2 diabetes mellitus with other specified complication: Secondary | ICD-10-CM

## 2021-07-26 DIAGNOSIS — E785 Hyperlipidemia, unspecified: Secondary | ICD-10-CM | POA: Diagnosis not present

## 2021-07-26 DIAGNOSIS — Z79899 Other long term (current) drug therapy: Secondary | ICD-10-CM

## 2021-07-26 DIAGNOSIS — I10 Essential (primary) hypertension: Secondary | ICD-10-CM

## 2021-07-26 DIAGNOSIS — E1165 Type 2 diabetes mellitus with hyperglycemia: Secondary | ICD-10-CM | POA: Diagnosis not present

## 2021-07-26 MED ORDER — METFORMIN HCL 500 MG PO TABS: ORAL_TABLET | ORAL | 1 refills | Status: DC

## 2021-07-26 MED ORDER — TIRZEPATIDE 12.5 MG/0.5ML ~~LOC~~ SOAJ: SUBCUTANEOUS | 5 refills | Status: DC

## 2021-07-26 NOTE — Progress Notes (Signed)
   Subjective:    Patient ID: [name], female    DOB: 08/29/1950, 71 y.o.   MRN: [id number]  Diabetes She presents for her follow-up diabetic visit. She has type 2 diabetes mellitus. There are no hypoglycemic associated symptoms. There are no diabetic associated symptoms. There are no hypoglycemic complications. There are no diabetic complications. She is compliant with treatment all of the time (checking sugars daily or every other day). (BG this morning 113)   We reviewed over her labs today.  Patient states she is tolerating the medicine well.  She feels her weight is coming down. She is taking her statin we reviewed over how this is helping her in reducing her risk for heart disease She is tolerating the metformin but since we are going up on the [facility] I feel we can reduce the metformin.  Review of Systems     Objective:   Physical Exam General-in no acute distress Eyes-no discharge Lungs-respiratory rate normal, CTA CV-no murmurs,RRR Extremities skin warm dry no edema Neuro grossly normal Behavior normal, alert        Assessment & Plan:  1. Type 2 diabetes mellitus with hemoglobin A1c goal of less than 7.5% (HCC) Diabetes under good control check labs before next visit After shared discussion patient would like to go up on Mounjaro 12.5 mg once weekly if any side effects or problems notify us - Hemoglobin A1c - Lipid panel - Hepatic function panel - Basic metabolic panel - Microalbumin/Creatinine Ratio, Urine  2. Hyperlipidemia associated with type 2 diabetes mellitus ([place]) Continue statin previous lab look good follow-up labs late in the fall - Hemoglobin A1c - Lipid panel - Hepatic function panel - Basic metabolic panel - Microalbumin/Creatinine Ratio, Urine  3. Hypertension, unspecified type Blood pressure excellent continue current measures - Hemoglobin A1c - Lipid panel - Hepatic function panel - Basic metabolic panel - Microalbumin/Creatinine  Ratio, Urine  4. High risk medication use Labs ordered - Hemoglobin A1c - Lipid panel - Hepatic function panel - Basic metabolic panel - Microalbumin/Creatinine Ratio, Urine  Diabetic foot exam normal

## 2021-07-27 LAB — HEMOGLOBIN A1C
Est. average glucose Bld gHb Est-mCnc: 143 mg/dL
Hgb A1c MFr Bld: 6.6 % — ABNORMAL HIGH (ref 4.8–5.6)

## 2021-07-27 LAB — BASIC METABOLIC PANEL
BUN/Creatinine Ratio: 14 (ref 12–28)
BUN: 13 mg/dL (ref 8–27)
CO2: 22 mmol/L (ref 20–29)
Calcium: 9.2 mg/dL (ref 8.7–10.3)
Chloride: 103 mmol/L (ref 96–106)
Creatinine, Ser: 0.95 mg/dL (ref 0.57–1.00)
Glucose: 132 mg/dL — ABNORMAL HIGH (ref 70–99)
Potassium: 4.3 mmol/L (ref 3.5–5.2)
Sodium: 139 mmol/L (ref 134–144)
eGFR: 64 mL/min/{1.73_m2} (ref >59)

## 2021-07-27 LAB — LIPID PANEL
Chol/HDL Ratio: 5.5 ratio — ABNORMAL HIGH (ref 0.0–4.4)
Cholesterol, Total: 244 mg/dL — ABNORMAL HIGH (ref 100–199)
HDL: 44 mg/dL (ref >39)
LDL Chol Calc (NIH): 171 mg/dL — ABNORMAL HIGH (ref 0–99)
Triglycerides: 157 mg/dL — ABNORMAL HIGH (ref 0–149)
VLDL Cholesterol Cal: 29 mg/dL (ref 5–40)

## 2021-07-27 LAB — MICROALBUMIN / CREATININE URINE RATIO
Creatinine, Urine: 151.5 mg/dL
Microalb/Creat Ratio: 5 mg/g creat (ref 0–29)
Microalbumin, Urine: 6.9 ug/mL

## 2021-07-30 ENCOUNTER — Other Ambulatory Visit: Payer: Self-pay | Admitting: Family Medicine

## 2021-07-30 DIAGNOSIS — Z79899 Other long term (current) drug therapy: Secondary | ICD-10-CM

## 2021-07-30 DIAGNOSIS — E1169 Type 2 diabetes mellitus with other specified complication: Secondary | ICD-10-CM

## 2021-07-30 MED ORDER — EZETIMIBE 10 MG PO TABS: ORAL_TABLET | ORAL | 5 refills | Status: DC

## 2021-08-16 ENCOUNTER — Ambulatory Visit (HOSPITAL_COMMUNITY): Payer: Medicare HMO

## 2021-08-19 ENCOUNTER — Ambulatory Visit (HOSPITAL_COMMUNITY)
Admission: RE | Admit: 2021-08-19 | Discharge: 2021-08-19 | Disposition: A | Discharge status: Home / Self Care | Payer: Medicare HMO | Source: Ambulatory Visit | Attending: Family Medicine | Admitting: Family Medicine

## 2021-08-19 DIAGNOSIS — Z1231 Encounter for screening mammogram for malignant neoplasm of breast: Secondary | ICD-10-CM | POA: Diagnosis not present

## 2021-08-23 ENCOUNTER — Telehealth: Payer: Self-pay

## 2021-08-23 NOTE — Telephone Encounter (Signed)
Encourage patient to contact the pharmacy for refills or they can request refills through [facility]  (Please schedule appointment if patient has not been seen in over a year)    WHAT PHARMACY WOULD THEY LIKE THIS SENT TO: [facility] in [place] ([place]) 40 MG tablet   NOTES/COMMENTS FROM PATIENT:Pt has miss placed her medication and wants to see if she can get another refill       Front office please notify patient: It takes 48-72 hours to process rx refill requests Ask patient to call pharmacy to ensure rx is ready before heading there.

## 2021-08-24 ENCOUNTER — Other Ambulatory Visit: Payer: Self-pay | Admitting: Nurse Practitioner

## 2021-08-24 DIAGNOSIS — E1169 Type 2 diabetes mellitus with other specified complication: Secondary | ICD-10-CM

## 2021-08-24 MED ORDER — ROSUVASTATIN CALCIUM 40 MG PO TABS: 40.0000 mg | ORAL_TABLET | Freq: Every day | ORAL | 1 refills | Status: DC

## 2021-08-24 NOTE — Telephone Encounter (Signed)
Done

## 2021-09-05 ENCOUNTER — Other Ambulatory Visit: Payer: Self-pay | Admitting: *Deleted

## 2021-09-11 ENCOUNTER — Other Ambulatory Visit: Payer: Self-pay | Admitting: Family Medicine

## 2021-09-23 ENCOUNTER — Encounter: Payer: Self-pay | Admitting: Family Medicine

## 2021-09-23 ENCOUNTER — Ambulatory Visit (INDEPENDENT_AMBULATORY_CARE_PROVIDER_SITE_OTHER): Payer: Medicare HMO | Admitting: Family Medicine

## 2021-09-23 VITALS — BP 108/69 | HR 79 | Wt 272.8 lb

## 2021-09-23 DIAGNOSIS — E119 Type 2 diabetes mellitus without complications: Secondary | ICD-10-CM | POA: Diagnosis not present

## 2021-09-23 DIAGNOSIS — Z23 Encounter for immunization: Secondary | ICD-10-CM

## 2021-09-23 DIAGNOSIS — E1169 Type 2 diabetes mellitus with other specified complication: Secondary | ICD-10-CM

## 2021-09-23 DIAGNOSIS — E785 Hyperlipidemia, unspecified: Secondary | ICD-10-CM | POA: Diagnosis not present

## 2021-09-23 DIAGNOSIS — Z79899 Other long term (current) drug therapy: Secondary | ICD-10-CM | POA: Diagnosis not present

## 2021-09-23 NOTE — Progress Notes (Signed)
   Subjective:    Patient ID: [name], female    DOB: 1950-04-01, 71 y.o.   MRN: [id number]  HPI Pt arrives for follow up. Pt was seen on 07/26/21 for DM. Pt states sugars have been good. No issues/concerns at this time.  Patient states she has been doing the best she can and eating healthy and staying active.  Tolerating the [facility].  No side effects.  No vomiting.  Has lost some weight.  Review of Systems     Objective:   Physical Exam  General-in no acute distress Eyes-no discharge Lungs-respiratory rate normal, CTA CV-no murmurs,RRR Extremities skin warm dry no edema Neuro grossly normal Behavior normal, alert       Assessment & Plan:   1. Need for vaccination Today - Flu Vaccine QUAD High Dose(Fluad)  2. Hyperlipidemia associated with type 2 diabetes mellitus ([place]) Continue medication healthy diet recommended follow-up in approximately 5 months  3. Type 2 diabetes mellitus with hemoglobin A1c goal of less than 7.5% (HCC) Continue healthy diet tolerates medicine not having any significant side effects follow-up 5 months

## 2021-09-24 LAB — HEPATIC FUNCTION PANEL
ALT: 14 IU/L (ref 0–32)
AST: 17 IU/L (ref 0–40)
Albumin: 4.4 g/dL (ref 3.8–4.8)
Alkaline Phosphatase: 141 IU/L — ABNORMAL HIGH (ref 44–121)
Bilirubin Total: 0.3 mg/dL (ref 0.0–1.2)
Bilirubin, Direct: 0.12 mg/dL (ref 0.00–0.40)
Total Protein: 8 g/dL (ref 6.0–8.5)

## 2021-09-24 LAB — LIPID PANEL
Chol/HDL Ratio: 3.8 ratio (ref 0.0–4.4)
Cholesterol, Total: 164 mg/dL (ref 100–199)
HDL: 43 mg/dL (ref >39)
LDL Chol Calc (NIH): 106 mg/dL — ABNORMAL HIGH (ref 0–99)
Triglycerides: 79 mg/dL (ref 0–149)
VLDL Cholesterol Cal: 15 mg/dL (ref 5–40)

## 2021-09-25 ENCOUNTER — Other Ambulatory Visit: Payer: Self-pay

## 2021-09-25 ENCOUNTER — Telehealth: Payer: Self-pay | Admitting: Orthopedic Surgery

## 2021-09-25 DIAGNOSIS — E119 Type 2 diabetes mellitus without complications: Secondary | ICD-10-CM

## 2021-09-25 DIAGNOSIS — I10 Essential (primary) hypertension: Secondary | ICD-10-CM

## 2021-09-25 DIAGNOSIS — R748 Abnormal levels of other serum enzymes: Secondary | ICD-10-CM

## 2021-09-25 DIAGNOSIS — E1169 Type 2 diabetes mellitus with other specified complication: Secondary | ICD-10-CM

## 2021-09-25 DIAGNOSIS — Z79899 Other long term (current) drug therapy: Secondary | ICD-10-CM

## 2021-09-25 NOTE — Telephone Encounter (Signed)
Called back to patient; scheduled for tomorrow in cancellation slot. Aware

## 2021-09-25 NOTE — Telephone Encounter (Signed)
Patient called this morning to request immediate appointment with Dr Aline Brochure for problem of left hip pain

## 2021-09-26 ENCOUNTER — Ambulatory Visit (INDEPENDENT_AMBULATORY_CARE_PROVIDER_SITE_OTHER): Payer: Medicare HMO

## 2021-09-26 ENCOUNTER — Ambulatory Visit: Payer: Medicare HMO | Admitting: Orthopedic Surgery

## 2021-09-26 ENCOUNTER — Encounter: Payer: Self-pay | Admitting: Orthopedic Surgery

## 2021-09-26 VITALS — BP 138/73 | HR 98 | Ht 67.0 in | Wt 270.0 lb

## 2021-09-26 DIAGNOSIS — M48061 Spinal stenosis, lumbar region without neurogenic claudication: Secondary | ICD-10-CM | POA: Diagnosis not present

## 2021-09-26 DIAGNOSIS — M7062 Trochanteric bursitis, left hip: Secondary | ICD-10-CM

## 2021-09-26 DIAGNOSIS — M25552 Pain in left hip: Secondary | ICD-10-CM | POA: Diagnosis not present

## 2021-09-26 MED ORDER — METHOCARBAMOL 500 MG PO TABS: 500.0000 mg | ORAL_TABLET | Freq: Three times a day (TID) | ORAL | 1 refills | Status: DC

## 2021-09-26 MED ORDER — METHYLPREDNISOLONE ACETATE 40 MG/ML IJ SUSP
40.0000 mg | Freq: Once | INTRAMUSCULAR | Status: AC
  Administered 2021-09-26: 40 mg via INTRA_ARTICULAR

## 2021-09-26 MED ORDER — IBUPROFEN 800 MG PO TABS: 800.0000 mg | ORAL_TABLET | Freq: Three times a day (TID) | ORAL | 1 refills | Status: DC | PRN

## 2021-09-26 NOTE — Progress Notes (Addendum)
Chief Complaint  Patient presents with   Hip Pain    Left lateral hip pain for a couple days history of hip replacement    71 year old female with diabetes history of spinal stenosis status post left total hip in 2012 presents with Acute onset of pain in her lower back radiating to her left hip and somewhat down her left thigh no history of trauma  Patient notes she is having more difficulties with daily chores around the house  Exam shows well-healed incision over the left hip no signs of erythema or swelling no warmth noted.  Tenderness is over the greater trochanter but she also has lower back tenderness and I can track the pain from the lower back central area across the buttocks into the area of the left trochanter  Imaging shows a stable left total hip with no signs of loosening cup position looks good.  Encounter Diagnoses  Name Primary?   Acute hip pain, left Yes   Greater trochanteric bursitis, left    Exacerbation of chronic back pain with spinal stenosis of lumbar region, unspecified whether neurogenic claudication present    Recommend IM shot for possible bursitis left hip as she has direct tenderness over that area and then she did well in terms of pain relief with ibuprofen so continue that, add Robaxin  Return in 3 to 4 weeks  IM injection left greater trochanteric bursa.  Patient agreed to injection.  Site confirmed.  Point of maximal tenderness over the greater trochanter was sprayed with ethyl chloride cleaned with alcohol spinal needle was used to inject.  No complications noted  Meds ordered this encounter  Medications   methylPREDNISolone acetate (DEPO-MEDROL) injection 40 mg   methocarbamol (ROBAXIN) 500 MG tablet    Sig: Take 1 tablet (500 mg total) by mouth 3 (three) times daily.    Dispense:  60 tablet    Refill:  1   ibuprofen (ADVIL) 800 MG tablet    Sig: Take 1 tablet (800 mg total) by mouth every 8 (eight) hours as needed.    Dispense:  90 tablet     Refill:  1

## 2021-09-26 NOTE — Addendum Note (Signed)
Addended by: Arther Abbott E on: 09/26/2021 02:17 PM   Modules accepted: Level of Service

## 2021-10-28 ENCOUNTER — Ambulatory Visit: Payer: Medicare HMO | Admitting: Orthopedic Surgery

## 2021-10-28 ENCOUNTER — Encounter: Payer: Self-pay | Admitting: Orthopedic Surgery

## 2021-10-28 DIAGNOSIS — M25552 Pain in left hip: Secondary | ICD-10-CM | POA: Diagnosis not present

## 2021-10-28 DIAGNOSIS — M7062 Trochanteric bursitis, left hip: Secondary | ICD-10-CM

## 2021-10-28 DIAGNOSIS — M5126 Other intervertebral disc displacement, lumbar region: Secondary | ICD-10-CM | POA: Diagnosis not present

## 2021-10-28 DIAGNOSIS — M48061 Spinal stenosis, lumbar region without neurogenic claudication: Secondary | ICD-10-CM

## 2021-10-28 NOTE — Progress Notes (Signed)
FOLLOW UP   Encounter Diagnoses  Name Primary?   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

## 2021-11-26 ENCOUNTER — Ambulatory Visit: Payer: Medicare HMO | Admitting: Family Medicine

## 2021-12-03 ENCOUNTER — Ambulatory Visit (INDEPENDENT_AMBULATORY_CARE_PROVIDER_SITE_OTHER): Payer: Medicare HMO | Admitting: Nurse Practitioner

## 2021-12-03 ENCOUNTER — Encounter: Payer: Self-pay | Admitting: Nurse Practitioner

## 2021-12-03 ENCOUNTER — Encounter: Payer: Medicare HMO | Admitting: Nurse Practitioner

## 2021-12-03 VITALS — BP 112/60 | HR 78 | Temp 96.4°F | Ht 67.0 in | Wt 271.0 lb

## 2021-12-03 DIAGNOSIS — R748 Abnormal levels of other serum enzymes: Secondary | ICD-10-CM | POA: Diagnosis not present

## 2021-12-03 DIAGNOSIS — E785 Hyperlipidemia, unspecified: Secondary | ICD-10-CM | POA: Diagnosis not present

## 2021-12-03 DIAGNOSIS — E119 Type 2 diabetes mellitus without complications: Secondary | ICD-10-CM | POA: Diagnosis not present

## 2021-12-03 DIAGNOSIS — Z0000 Encounter for general adult medical examination without abnormal findings: Secondary | ICD-10-CM | POA: Diagnosis not present

## 2021-12-03 DIAGNOSIS — I10 Essential (primary) hypertension: Secondary | ICD-10-CM | POA: Diagnosis not present

## 2021-12-03 DIAGNOSIS — Z79899 Other long term (current) drug therapy: Secondary | ICD-10-CM | POA: Diagnosis not present

## 2021-12-03 DIAGNOSIS — E1169 Type 2 diabetes mellitus with other specified complication: Secondary | ICD-10-CM | POA: Diagnosis not present

## 2021-12-03 NOTE — Progress Notes (Signed)
   Subjective:    Patient ID: [name], female    DOB: 12-30-1950, 71 y.o.   MRN: [id number]  HPI AWV- Annual Wellness Visit  The patient was seen for their annual wellness visit. The patient's past medical history, surgical history, and family history were reviewed. Pertinent vaccines were reviewed ( tetanus, pneumonia, shingles, flu) The patient's medication list was reviewed and updated.  The height and weight were entered.  BMI recorded in electronic record elsewhere  Cognitive screening was completed. Outcome of Mini - Cog: 5   Falls /depression screening electronically recorded within record elsewhere  Current tobacco usage:no (All patients who use tobacco were given written and verbal information on quitting)  Recent listing of emergency department/hospitalizations over the past year were reviewed.  current specialist the patient sees on a regular basis: no   Medicare annual wellness visit patient questionnaire was reviewed.  A written screening schedule for the patient for the next 5-10 years was given. Appropriate discussion of followup regarding next visit was discussed.      Review of Systems  All other systems reviewed and are negative.      Objective:   Physical Exam Vitals reviewed.  Constitutional:      General: She is not in acute distress.    Appearance: Normal appearance. She is obese. She is not ill-appearing, toxic-appearing or diaphoretic.  HENT:     Head: Normocephalic and atraumatic.  Musculoskeletal:     Comments: Grossly intact  Neurological:     Mental Status: She is alert.     Comments: Grossly intact  Psychiatric:        Mood and Affect: Mood normal.        Behavior: Behavior normal.           Assessment & Plan:   1. Medicare annual wellness visit, subsequent Adult wellness-complete.wellness physical was conducted today. Importance of diet and exercise were discussed in detail.  Importance of stress reduction and  healthy living were discussed.  In addition to this a discussion regarding safety was also covered.  We also reviewed over immunizations and gave recommendations regarding current immunization needed for age.   In addition to this additional areas were also touched on including: Preventative health exams needed:  Colonoscopy up to date due next in 12/25/2030 Mammogram up to date due next year 08/20/2022 Eye exam up to date due next year 05/21/2022 Foot exam up to date due next 7/14/202  Get labs today. Already ordered.   Patient was advised yearly wellness exam

## 2021-12-03 NOTE — Patient Instructions (Addendum)
Thank you for coming for your annual wellness visit.  Please follow through on any advice that was given to you by today's visit. Remember to maintain compliance with your medications as discussed today.  Also remember it is important to eat a healthy diet and to stay physically active on a daily basis.  Please follow through with any testing or recommended followup office visits as was discussed today. You are due the following test coming up:  Colonoscopy up to date due next in 12/25/2030 Mammogram up to date due next year 08/20/2022 Eye exam up to date due next year 05/21/2022 Foot exam up to date due next 7/14/202  Labs due today    Finally remembered that the annual wellness visit does not take the place of regularly scheduled office visits  chronic health problems such as hypertension/diabetes/cholesterol visits.

## 2021-12-03 NOTE — Addendum Note (Signed)
Addended by: Claire Shown on: 12/03/2021 10:00 AM   Modules accepted: Orders

## 2021-12-04 ENCOUNTER — Other Ambulatory Visit: Payer: Self-pay | Admitting: Family Medicine

## 2021-12-04 DIAGNOSIS — E119 Type 2 diabetes mellitus without complications: Secondary | ICD-10-CM

## 2021-12-04 LAB — HEPATIC FUNCTION PANEL
ALT: 14 IU/L (ref 0–32)
AST: 20 IU/L (ref 0–40)
Albumin: 4.2 g/dL (ref 3.8–4.8)
Alkaline Phosphatase: 117 IU/L (ref 44–121)
Bilirubin Total: 0.4 mg/dL (ref 0.0–1.2)
Bilirubin, Direct: 0.12 mg/dL (ref 0.00–0.40)
Total Protein: 8 g/dL (ref 6.0–8.5)

## 2021-12-04 LAB — LIPID PANEL
Chol/HDL Ratio: 4.3 ratio (ref 0.0–4.4)
Cholesterol, Total: 193 mg/dL (ref 100–199)
HDL: 45 mg/dL (ref >39)
LDL Chol Calc (NIH): 134 mg/dL — ABNORMAL HIGH (ref 0–99)
Triglycerides: 77 mg/dL (ref 0–149)
VLDL Cholesterol Cal: 14 mg/dL (ref 5–40)

## 2021-12-04 LAB — HEMOGLOBIN A1C
Est. average glucose Bld gHb Est-mCnc: 140 mg/dL
Hgb A1c MFr Bld: 6.5 % — ABNORMAL HIGH (ref 4.8–5.6)

## 2021-12-04 LAB — BASIC METABOLIC PANEL
BUN/Creatinine Ratio: 14 (ref 12–28)
BUN: 13 mg/dL (ref 8–27)
CO2: 24 mmol/L (ref 20–29)
Calcium: 9.6 mg/dL (ref 8.7–10.3)
Chloride: 102 mmol/L (ref 96–106)
Creatinine, Ser: 0.93 mg/dL (ref 0.57–1.00)
Glucose: 74 mg/dL (ref 70–99)
Potassium: 3.9 mmol/L (ref 3.5–5.2)
Sodium: 140 mmol/L (ref 134–144)
eGFR: 66 mL/min/{1.73_m2} (ref >59)

## 2021-12-04 LAB — GAMMA GT: GGT: 23 IU/L (ref 0–60)

## 2021-12-04 LAB — MICROALBUMIN / CREATININE URINE RATIO
Creatinine, Urine: 223.5 mg/dL
Microalb/Creat Ratio: 8 mg/g creat (ref 0–29)
Microalbumin, Urine: 18.9 ug/mL

## 2021-12-07 ENCOUNTER — Encounter: Payer: Self-pay | Admitting: Family Medicine

## 2021-12-12 MED ORDER — ROSUVASTATIN CALCIUM 10 MG PO TABS: 10.0000 mg | ORAL_TABLET | Freq: Every evening | ORAL | 3 refills | Status: DC

## 2021-12-12 NOTE — Addendum Note (Signed)
Addended by: Dairl Ponder on: 12/12/2021 03:11 PM   Modules accepted: Orders

## 2021-12-19 ENCOUNTER — Telehealth: Payer: Self-pay | Admitting: Family Medicine

## 2021-12-19 NOTE — Telephone Encounter (Signed)
Form was completed 

## 2021-12-19 NOTE — Telephone Encounter (Signed)
Patient dropped off form to be complete in your box.

## 2022-01-01 ENCOUNTER — Other Ambulatory Visit: Payer: Self-pay | Admitting: Family Medicine

## 2022-01-01 DIAGNOSIS — E119 Type 2 diabetes mellitus without complications: Secondary | ICD-10-CM

## 2022-03-24 ENCOUNTER — Ambulatory Visit: Payer: Medicare HMO | Admitting: Family Medicine

## 2022-04-01 ENCOUNTER — Ambulatory Visit (INDEPENDENT_AMBULATORY_CARE_PROVIDER_SITE_OTHER): Payer: Medicare HMO | Admitting: Family Medicine

## 2022-04-01 ENCOUNTER — Encounter: Payer: Self-pay | Admitting: Family Medicine

## 2022-04-01 VITALS — BP 132/68 | Ht 67.0 in | Wt 270.8 lb

## 2022-04-01 DIAGNOSIS — I10 Essential (primary) hypertension: Secondary | ICD-10-CM

## 2022-04-01 DIAGNOSIS — E1169 Type 2 diabetes mellitus with other specified complication: Secondary | ICD-10-CM

## 2022-04-01 DIAGNOSIS — E119 Type 2 diabetes mellitus without complications: Secondary | ICD-10-CM | POA: Diagnosis not present

## 2022-04-01 DIAGNOSIS — E785 Hyperlipidemia, unspecified: Secondary | ICD-10-CM

## 2022-04-01 DIAGNOSIS — Z79899 Other long term (current) drug therapy: Secondary | ICD-10-CM | POA: Diagnosis not present

## 2022-04-01 MED ORDER — ROSUVASTATIN CALCIUM 10 MG PO TABS: 10.0000 mg | ORAL_TABLET | Freq: Every evening | ORAL | 1 refills | Status: DC

## 2022-04-01 MED ORDER — TIRZEPATIDE 12.5 MG/0.5ML ~~LOC~~ SOAJ: SUBCUTANEOUS | 5 refills | Status: DC

## 2022-04-01 MED ORDER — METFORMIN HCL 500 MG PO TABS: ORAL_TABLET | ORAL | 1 refills | Status: DC

## 2022-04-01 NOTE — Progress Notes (Signed)
   Subjective:    Patient ID: [name], female    DOB: 1950/09/27, 72 y.o.   MRN: [id number]  Diabetes She presents for her follow-up diabetic visit. She has type 2 diabetes mellitus. Risk factors for coronary artery disease include diabetes mellitus, post-menopausal and dyslipidemia. Current diabetic treatments: glucophage and mounjaro. She is compliant with treatment all of the time.   Type 2 diabetes mellitus with hemoglobin A1c goal of less than 7.5% (HCC)  Morbid obesity (HCC)  Hypertension, unspecified type  Hyperlipidemia associated with type 2 diabetes mellitus ([place])  No problems or concerns per patient Patient for blood pressure check up.  The patient does have hypertension.   Patient relates dietary measures try to minimize salt The importance of healthy diet and activity were discussed Patient relates compliance  Patient here for follow-up regarding cholesterol.    Patient relates taking medication on a regular basis Denies problems with medication Importance of dietary measures discussed Regular lab work regarding lipid and liver was checked and if needing additional labs was appropriately ordered  The patient was seen today as part of a comprehensive diabetic check up. Patient has diabetes Patient relates good compliance with taking the medication. We discussed their diet and exercise activities  We also discussed the importance of notifying us if any excessively high glucoses or low sugars.    The patient's BMI is calculated.  The patient does have obesity.  The patient does try to some degree staying active and watching diet.  It is in the vital signs and acknowledged.  It is above the recommended BMI for the patient's height and weight.  The patient has been counseled regarding healthy diet, restricted portions, avoiding excessive carbohydrates/sugary foods, and increase physical activity as health permits.  It is in the patient's best interest to lower the  risk of secondary illness including heart disease strokes and cancer by losing weight.  The patient acknowledges this information.  Review of Systems     Objective:   Physical Exam  General-in no acute distress Eyes-no discharge Lungs-respiratory rate normal, CTA CV-no murmurs,RRR Extremities skin warm dry no edema Neuro grossly normal Behavior normal, alert       Assessment & Plan:  1. Type 2 diabetes mellitus with hemoglobin A1c goal of less than 7.5% (HCC) Healthy diet regular activity continue medication weight is coming down  2. Morbid obesity ([place]) Portion control continue medication weight is coming down  3. Hypertension, unspecified type HTN- patient seen for follow-up regarding HTN.   Diet, medication compliance, appropriate labs and refills were completed.   Importance of keeping blood pressure under good control to lessen the risk of complications discussed Regular follow-up visits discussed   4. Hyperlipidemia associated with type 2 diabetes mellitus (HCC) Hyperlipidemia-importance of diet, weight control, activity, compliance with medications discussed.   Recent labs reviewed.   Any additional labs or refills ordered.   Importance of keeping under good control discussed. Regular follow-up visits discussed   Lab work drawn today await results Follow-up mid summer

## 2022-04-01 NOTE — Patient Instructions (Signed)
Hi [name]  You are doing very well.  Please keep up the good work.  Continue to eat healthy.  Fit in activity on a daily basis.  Also please make sure the eye doctor sends [place] a copy of their examination.  Have a happy birthday in May.  We will see you in August.  [place]

## 2022-04-02 LAB — HEPATIC FUNCTION PANEL
ALT: 16 IU/L (ref 0–32)
AST: 17 IU/L (ref 0–40)
Albumin: 4.3 g/dL (ref 3.8–4.8)
Alkaline Phosphatase: 118 IU/L (ref 44–121)
Bilirubin Total: 0.3 mg/dL (ref 0.0–1.2)
Bilirubin, Direct: <0.1 mg/dL (ref 0.00–0.40)
Total Protein: 7.9 g/dL (ref 6.0–8.5)

## 2022-04-02 LAB — LIPID PANEL
Chol/HDL Ratio: 3.9 ratio (ref 0.0–4.4)
Cholesterol, Total: 165 mg/dL (ref 100–199)
HDL: 42 mg/dL (ref >39)
LDL Chol Calc (NIH): 102 mg/dL — ABNORMAL HIGH (ref 0–99)
Triglycerides: 117 mg/dL (ref 0–149)
VLDL Cholesterol Cal: 21 mg/dL (ref 5–40)

## 2022-05-20 ENCOUNTER — Telehealth: Payer: Self-pay | Admitting: Family Medicine

## 2022-05-20 NOTE — Telephone Encounter (Signed)
Patient calling to let you know she can't that she can't find Mounjaro 12.5 in [place] , [place] or [place]. I mention [place] or [place] . She states doesn't travel out that far. Please advise

## 2022-05-21 NOTE — Telephone Encounter (Signed)
Unfortunately there is a Scientist, clinical (histocompatibility and immunogenetics) I would encourage her to check around to see if there is a lower dose Mounjaro such as 7.5 or 10 mg if she finds lower dose we can send in for that That is not a perfect solution but it is better than going without the medicine

## 2022-05-27 NOTE — Telephone Encounter (Signed)
Spoke with the patient and informed her of the doctor's instructions regarding mounjaro.

## 2022-05-28 ENCOUNTER — Other Ambulatory Visit: Payer: Self-pay | Admitting: Family Medicine

## 2022-05-28 ENCOUNTER — Telehealth: Payer: Self-pay

## 2022-05-28 NOTE — Telephone Encounter (Signed)
May have this +3 refills 

## 2022-05-28 NOTE — Telephone Encounter (Signed)
Prescription Request  05/28/2022  LOV: Visit date not found  What is the name of the medication or equipment? tirzepatide [facility]) 10.5 MG/0.5ML Pen   Have you contacted your pharmacy to request a refill? Yes   Which pharmacy would you like this sent to? [facility]   Patient notified that their request is being sent to the clinical staff for review and that they should receive a response within 2 business days.   Please advise at Mobile [phone] (mobile)

## 2022-05-29 ENCOUNTER — Other Ambulatory Visit: Payer: Self-pay | Admitting: *Deleted

## 2022-05-29 MED ORDER — TIRZEPATIDE 10 MG/0.5ML ~~LOC~~ SOAJ: 10.0000 mg | SUBCUTANEOUS | 0 refills | Status: DC

## 2022-05-29 NOTE — Telephone Encounter (Signed)
Prescription sent electronically to pharmacy. 

## 2022-05-30 ENCOUNTER — Telehealth: Payer: Self-pay | Admitting: Family Medicine

## 2022-05-30 DIAGNOSIS — E1169 Type 2 diabetes mellitus with other specified complication: Secondary | ICD-10-CM

## 2022-05-30 DIAGNOSIS — Z79899 Other long term (current) drug therapy: Secondary | ICD-10-CM

## 2022-05-30 DIAGNOSIS — E119 Type 2 diabetes mellitus without complications: Secondary | ICD-10-CM

## 2022-05-30 DIAGNOSIS — I10 Essential (primary) hypertension: Secondary | ICD-10-CM

## 2022-05-30 NOTE — Telephone Encounter (Signed)
Nurses Nonurgent Patient has an appointment in August she needs to do blood work before that visit Diagnosis hypertension hyperlipidemia diabetes, high risk meds Labs-A1c, lipid, liver, metabolic 7, A1c  Please mail the patient these papers and with the instruction to do the labs before her August visit  Thank you

## 2022-06-03 NOTE — Telephone Encounter (Signed)
Blood work ordered in EPIC 

## 2022-06-04 DIAGNOSIS — H5203 Hypermetropia, bilateral: Secondary | ICD-10-CM | POA: Diagnosis not present

## 2022-06-04 LAB — HM DIABETES EYE EXAM

## 2022-07-22 ENCOUNTER — Telehealth: Payer: Self-pay | Admitting: Orthopedic Surgery

## 2022-07-22 NOTE — Telephone Encounter (Signed)
Dr. Mort Sawyers pt - patient presented to the office stating that she is having some dental work done on Monday, she is needing the antibiotic for this.  [facility].  [phone]

## 2022-07-23 ENCOUNTER — Other Ambulatory Visit: Payer: Self-pay | Admitting: Orthopedic Surgery

## 2022-07-23 DIAGNOSIS — Z792 Long term (current) use of antibiotics: Secondary | ICD-10-CM

## 2022-07-23 MED ORDER — CLINDAMYCIN HCL 150 MG PO CAPS: ORAL_CAPSULE | ORAL | 0 refills | Status: DC

## 2022-07-23 NOTE — Progress Notes (Signed)
Requested Prescriptions   Signed Prescriptions Disp Refills   clindamycin (CLEOCIN) 150 MG capsule 10 capsule 0    Sig: Take 2;  30 min before dental procedure   Encounter Diagnosis  Name Primary?   Need for antibiotic prophylaxis for dental procedure Yes

## 2022-07-28 ENCOUNTER — Telehealth: Payer: Self-pay

## 2022-07-28 NOTE — Telephone Encounter (Signed)
Chental from [facility] left message stating that they have some questions about medication for this patient from Dr. Romeo Apple.  Asking for a return call to discuss  [phone]

## 2022-07-28 NOTE — Telephone Encounter (Signed)
I Called / Dr Romeo Apple sent in the Clindamycin for her last week. (I was out of the office) He put in for her to take 300 mg / 30 minutes before they have Dr Diamantina Providence protocol as 600 mg one hour before which is what I have also  The dentist is asking why she was given reduced dose of 300 mg  Can you review the protocol with me please and I need to update if its changed?  I will also call dentist office back  I have the protocol at my desk, thanks Annessa Satre

## 2022-08-18 DIAGNOSIS — Z6841 Body Mass Index (BMI) 40.0 and over, adult: Secondary | ICD-10-CM | POA: Diagnosis not present

## 2022-08-18 DIAGNOSIS — E785 Hyperlipidemia, unspecified: Secondary | ICD-10-CM | POA: Diagnosis not present

## 2022-08-18 DIAGNOSIS — Z7985 Long-term (current) use of injectable non-insulin antidiabetic drugs: Secondary | ICD-10-CM | POA: Diagnosis not present

## 2022-08-18 DIAGNOSIS — E119 Type 2 diabetes mellitus without complications: Secondary | ICD-10-CM | POA: Diagnosis not present

## 2022-08-18 DIAGNOSIS — Z880 Allergy status to penicillin: Secondary | ICD-10-CM | POA: Diagnosis not present

## 2022-08-18 DIAGNOSIS — Z8249 Family history of ischemic heart disease and other diseases of the circulatory system: Secondary | ICD-10-CM | POA: Diagnosis not present

## 2022-08-18 DIAGNOSIS — Z008 Encounter for other general examination: Secondary | ICD-10-CM | POA: Diagnosis not present

## 2022-08-18 DIAGNOSIS — Z809 Family history of malignant neoplasm, unspecified: Secondary | ICD-10-CM | POA: Diagnosis not present

## 2022-08-25 ENCOUNTER — Other Ambulatory Visit (HOSPITAL_COMMUNITY): Payer: Self-pay | Admitting: Family Medicine

## 2022-08-25 ENCOUNTER — Ambulatory Visit (HOSPITAL_COMMUNITY)
Admission: RE | Admit: 2022-08-25 | Discharge: 2022-08-25 | Disposition: A | Discharge status: Home / Self Care | Payer: Medicare HMO | Source: Ambulatory Visit | Attending: Family Medicine | Admitting: Family Medicine

## 2022-08-25 ENCOUNTER — Encounter (HOSPITAL_COMMUNITY): Payer: Self-pay

## 2022-08-25 DIAGNOSIS — Z1231 Encounter for screening mammogram for malignant neoplasm of breast: Secondary | ICD-10-CM | POA: Diagnosis not present

## 2022-09-03 ENCOUNTER — Ambulatory Visit: Payer: Medicare HMO | Admitting: Family Medicine

## 2022-09-24 ENCOUNTER — Ambulatory Visit (INDEPENDENT_AMBULATORY_CARE_PROVIDER_SITE_OTHER): Payer: Medicare HMO | Admitting: Family Medicine

## 2022-09-24 ENCOUNTER — Ambulatory Visit: Payer: Medicare HMO | Admitting: Family Medicine

## 2022-09-24 VITALS — BP 112/73 | HR 81 | Temp 97.2°F | Ht 67.0 in | Wt 249.2 lb

## 2022-09-24 DIAGNOSIS — Z23 Encounter for immunization: Secondary | ICD-10-CM | POA: Diagnosis not present

## 2022-09-24 DIAGNOSIS — L03032 Cellulitis of left toe: Secondary | ICD-10-CM

## 2022-09-24 MED ORDER — DOXYCYCLINE HYCLATE 100 MG PO TABS: 100.0000 mg | ORAL_TABLET | Freq: Two times a day (BID) | ORAL | 0 refills | Status: DC

## 2022-09-24 NOTE — Progress Notes (Signed)
   Subjective:    Patient ID: [name], female    DOB: Dec 02, 1950, 72 y.o.   MRN: [id number]  HPI Pt is here today to discuss and have her left pinky toe examined. Pt states pain started Sunday morning 09/21/2022, pain woke pt up and she is not aware of any direct injury to the toe. Pt would like flu shot today.   Review of Systems     Objective:   Physical Exam General-in no acute distress Eyes-no discharge Lungs-respiratory rate normal, CTA CV-no murmurs,RRR Extremities skin warm dry no edema Neuro grossly normal Behavior normal, alert Telling infection noted on the left side       Assessment & Plan:  Toe cellulitis Supportive measures discussed Follow-up if progressive troubles Antibiotics prescribed Follow-up later this year for diabetes Notify us if not improving with this current issue

## 2022-10-10 ENCOUNTER — Ambulatory Visit (INDEPENDENT_AMBULATORY_CARE_PROVIDER_SITE_OTHER): Payer: Medicare HMO | Admitting: Family Medicine

## 2022-10-10 VITALS — BP 119/76 | HR 69 | Temp 97.7°F | Ht 67.0 in | Wt 254.4 lb

## 2022-10-10 DIAGNOSIS — I10 Essential (primary) hypertension: Secondary | ICD-10-CM | POA: Diagnosis not present

## 2022-10-10 DIAGNOSIS — E785 Hyperlipidemia, unspecified: Secondary | ICD-10-CM

## 2022-10-10 DIAGNOSIS — E119 Type 2 diabetes mellitus without complications: Secondary | ICD-10-CM

## 2022-10-10 DIAGNOSIS — Z7984 Long term (current) use of oral hypoglycemic drugs: Secondary | ICD-10-CM | POA: Diagnosis not present

## 2022-10-10 DIAGNOSIS — E1169 Type 2 diabetes mellitus with other specified complication: Secondary | ICD-10-CM

## 2022-10-10 DIAGNOSIS — Z79899 Other long term (current) drug therapy: Secondary | ICD-10-CM | POA: Diagnosis not present

## 2022-10-10 NOTE — Progress Notes (Signed)
   Subjective:    Patient ID: [name], female    DOB: Jun 07, 1950, 72 y.o.   MRN: [id number]  Discussed the use of AI scribe software for clinical note transcription with the patient, who gave verbal consent to proceed.  History of Present Illness   The patient, with a history of diabetes and hyperlipidemia, reports feeling well with no new health concerns. They have been adhering to their medication regimen, which includes metformin, a cholesterol medication, and a weekly injection of Mounjaro. They have recently increased their metformin dosage from half a pill twice a day to a whole pill twice a day.  The patient has been active, maintaining their yard, and has been cautious about potential COVID-19 exposure, avoiding places like the senior center and the gym. They have received their COVID-19 booster, flu shot, and possibly the RSV vaccine.  The patient has also had an eye exam this year and is due for a follow-up with the surgeon. They have no other health concerns at this time and plan to travel to [place] for a week during the Thanksgiving holiday.         Review of Systems     Objective:    Physical Exam   CHEST: Lungs clear to auscultation. CARDIOVASCULAR: Heart sounds regular without murmurs. EXTREMITIES: No swelling observed in lower extremities. No sores observed on the bottom of feet.           Assessment & Plan:  Assessment and Plan    Diabetes Mellitus Well controlled on Metformin and Mounjaro. Patient is compliant with medications and reports no issues. -Continue Metformin and Mounjaro as prescribed. -Check blood work results from this morning's lab draw.  Hyperlipidemia Well controlled on current medication. Patient is compliant with medication. -Continue current cholesterol medication as prescribed.  General Health Maintenance Patient has received COVID booster, flu shot, and likely RSV vaccine. Regular eye exams are being  conducted. -Continue current vaccination schedule. -Encourage regular eye exams.  Follow-up in six months (end of March/start of April). If any issues arise, patient is advised to call the office.

## 2022-10-11 LAB — HEPATIC FUNCTION PANEL
ALT: 14 [IU]/L (ref 0–32)
AST: 16 [IU]/L (ref 0–40)
Albumin: 4.1 g/dL (ref 3.8–4.8)
Alkaline Phosphatase: 97 [IU]/L (ref 44–121)
Bilirubin Total: 0.3 mg/dL (ref 0.0–1.2)
Bilirubin, Direct: <0.1 mg/dL (ref 0.00–0.40)
Total Protein: 7.5 g/dL (ref 6.0–8.5)

## 2022-10-11 LAB — BASIC METABOLIC PANEL
BUN/Creatinine Ratio: 12 (ref 12–28)
BUN: 11 mg/dL (ref 8–27)
CO2: 23 mmol/L (ref 20–29)
Calcium: 9.4 mg/dL (ref 8.7–10.3)
Chloride: 105 mmol/L (ref 96–106)
Creatinine, Ser: 0.93 mg/dL (ref 0.57–1.00)
Glucose: 84 mg/dL (ref 70–99)
Potassium: 3.9 mmol/L (ref 3.5–5.2)
Sodium: 142 mmol/L (ref 134–144)
eGFR: 65 mL/min/{1.73_m2} (ref >59)

## 2022-10-11 LAB — LIPID PANEL
Chol/HDL Ratio: 4.8 {ratio} — ABNORMAL HIGH (ref 0.0–4.4)
Cholesterol, Total: 229 mg/dL — ABNORMAL HIGH (ref 100–199)
HDL: 48 mg/dL (ref >39)
LDL Chol Calc (NIH): 161 mg/dL — ABNORMAL HIGH (ref 0–99)
Triglycerides: 111 mg/dL (ref 0–149)
VLDL Cholesterol Cal: 20 mg/dL (ref 5–40)

## 2022-10-11 LAB — HEMOGLOBIN A1C
Est. average glucose Bld gHb Est-mCnc: 137 mg/dL
Hgb A1c MFr Bld: 6.4 % — ABNORMAL HIGH (ref 4.8–5.6)

## 2022-10-20 ENCOUNTER — Other Ambulatory Visit: Payer: Self-pay

## 2022-10-20 DIAGNOSIS — E1169 Type 2 diabetes mellitus with other specified complication: Secondary | ICD-10-CM

## 2022-10-20 DIAGNOSIS — E785 Hyperlipidemia, unspecified: Secondary | ICD-10-CM

## 2022-10-20 DIAGNOSIS — Z79899 Other long term (current) drug therapy: Secondary | ICD-10-CM

## 2022-10-20 MED ORDER — ROSUVASTATIN CALCIUM 20 MG PO TABS
20.0000 mg | ORAL_TABLET | Freq: Every day | ORAL | 5 refills | Status: DC
Start: 2022-10-20 — End: 2023-05-04

## 2022-10-27 ENCOUNTER — Telehealth: Payer: Self-pay

## 2022-10-27 ENCOUNTER — Other Ambulatory Visit: Payer: Self-pay

## 2022-10-27 MED ORDER — METFORMIN HCL 500 MG PO TABS: ORAL_TABLET | ORAL | 1 refills | Status: DC

## 2022-10-27 NOTE — Telephone Encounter (Signed)
Prescription Request  10/27/2022  LOV: Visit date not found  What is the name of the medication or equipment? metFORMIN (GLUCOPHAGE) 500 MG tablet   Have you contacted your pharmacy to request a refill? Yes   Which pharmacy would you like this sent to?   [facility]   Patient notified that their request is being sent to the clinical staff for review and that they should receive a response within 2 business days.   Please advise at Mobile [phone] (mobile)

## 2022-11-04 ENCOUNTER — Other Ambulatory Visit: Payer: Self-pay | Admitting: Family Medicine

## 2022-11-04 MED ORDER — METFORMIN HCL 500 MG PO TABS: ORAL_TABLET | ORAL | 1 refills | Status: DC

## 2022-11-04 NOTE — Telephone Encounter (Signed)
So previously this prescription was sent to [facility] I resent it again today [facility] if for some reason they still do not have it have the patient notify us she should call their phone number thank you

## 2022-11-05 NOTE — Telephone Encounter (Signed)
Patient picked up script as originally written but just wanted to let you know how she is taking it

## 2022-11-05 NOTE — Telephone Encounter (Signed)
Patient stated she has went to just one whole once a day now because she has a hard time cutting them and forgets to take it in the evening a lot so she is gonna stick with one tablet in the mornings

## 2022-11-05 NOTE — Telephone Encounter (Signed)
So over the past 6 months and more she has been on half tablet twice daily.  This is what I would recommend for her to take. In other words I will not be changing the prescription I would imagine the pharmacy can confirm that she was taking 1/2 tablet twice a day based on her prescription (90 tablets 1/2 tablet twice daily )if for some reason it was 1 twice daily to let me know (if the pharmacy was prescribing 180 tablets 1 twice daily they would need to let me know)

## 2022-11-05 NOTE — Telephone Encounter (Signed)
It would be fine to change her prescription to metformin 500 mg 1 every morning #90 with 1 additional refill thank you

## 2023-02-06 ENCOUNTER — Other Ambulatory Visit: Payer: Self-pay

## 2023-02-06 ENCOUNTER — Telehealth: Payer: Self-pay | Admitting: Family Medicine

## 2023-02-06 DIAGNOSIS — Z79899 Other long term (current) drug therapy: Secondary | ICD-10-CM

## 2023-02-06 DIAGNOSIS — E1169 Type 2 diabetes mellitus with other specified complication: Secondary | ICD-10-CM

## 2023-02-06 DIAGNOSIS — I10 Essential (primary) hypertension: Secondary | ICD-10-CM

## 2023-02-06 DIAGNOSIS — E119 Type 2 diabetes mellitus without complications: Secondary | ICD-10-CM

## 2023-02-06 NOTE — Telephone Encounter (Signed)
Spoke with pt and informed per drs notes and recommendations. Labs ordered an scheduled appt

## 2023-02-06 NOTE — Telephone Encounter (Signed)
Patient is due for a diabetic checkup by March, April at the latest Please order metabolic 7, lipid, liver, A1c, urine ACR  Hyperlipidemia hypertension diabetes  Please have patient do lab work before her follow-up visit-I will mark her that urine is part of this She needs to go ahead and schedule because office visits Loistine Chance fast

## 2023-03-04 DIAGNOSIS — R030 Elevated blood-pressure reading, without diagnosis of hypertension: Secondary | ICD-10-CM | POA: Diagnosis not present

## 2023-03-04 DIAGNOSIS — J019 Acute sinusitis, unspecified: Secondary | ICD-10-CM | POA: Diagnosis not present

## 2023-03-05 ENCOUNTER — Encounter (HOSPITAL_COMMUNITY): Payer: Self-pay | Admitting: Emergency Medicine

## 2023-03-05 ENCOUNTER — Ambulatory Visit: Payer: Self-pay | Admitting: Family Medicine

## 2023-03-05 ENCOUNTER — Emergency Department (HOSPITAL_COMMUNITY): Payer: Medicare Other

## 2023-03-05 ENCOUNTER — Emergency Department (HOSPITAL_COMMUNITY)
Admission: EM | Admit: 2023-03-05 | Discharge: 2023-03-05 | Disposition: A | Discharge status: Home / Self Care | Payer: Medicare Other | Attending: Emergency Medicine | Admitting: Emergency Medicine

## 2023-03-05 ENCOUNTER — Other Ambulatory Visit: Payer: Self-pay

## 2023-03-05 DIAGNOSIS — J3489 Other specified disorders of nose and nasal sinuses: Secondary | ICD-10-CM | POA: Insufficient documentation

## 2023-03-05 DIAGNOSIS — Z7984 Long term (current) use of oral hypoglycemic drugs: Secondary | ICD-10-CM | POA: Diagnosis not present

## 2023-03-05 DIAGNOSIS — Z20822 Contact with and (suspected) exposure to covid-19: Secondary | ICD-10-CM | POA: Insufficient documentation

## 2023-03-05 DIAGNOSIS — R079 Chest pain, unspecified: Secondary | ICD-10-CM | POA: Diagnosis not present

## 2023-03-05 DIAGNOSIS — J101 Influenza due to other identified influenza virus with other respiratory manifestations: Secondary | ICD-10-CM | POA: Insufficient documentation

## 2023-03-05 DIAGNOSIS — R0789 Other chest pain: Secondary | ICD-10-CM | POA: Diagnosis not present

## 2023-03-05 DIAGNOSIS — E119 Type 2 diabetes mellitus without complications: Secondary | ICD-10-CM | POA: Diagnosis not present

## 2023-03-05 DIAGNOSIS — R059 Cough, unspecified: Secondary | ICD-10-CM | POA: Diagnosis not present

## 2023-03-05 DIAGNOSIS — Z79899 Other long term (current) drug therapy: Secondary | ICD-10-CM | POA: Diagnosis not present

## 2023-03-05 DIAGNOSIS — I10 Essential (primary) hypertension: Secondary | ICD-10-CM | POA: Diagnosis not present

## 2023-03-05 DIAGNOSIS — R519 Headache, unspecified: Secondary | ICD-10-CM | POA: Diagnosis present

## 2023-03-05 LAB — CBC WITH DIFFERENTIAL/PLATELET
Abs Immature Granulocytes: 0.01 10*3/uL (ref 0.00–0.07)
Basophils Absolute: 0 10*3/uL (ref 0.0–0.1)
Basophils Relative: 1 %
Eosinophils Absolute: 0 10*3/uL (ref 0.0–0.5)
Eosinophils Relative: 0 %
HCT: 41.2 % (ref 36.0–46.0)
Hemoglobin: 13.8 g/dL (ref 12.0–15.0)
Immature Granulocytes: 0 %
Lymphocytes Relative: 36 %
Lymphs Abs: 1.6 10*3/uL (ref 0.7–4.0)
MCH: 29.2 pg (ref 26.0–34.0)
MCHC: 33.5 g/dL (ref 30.0–36.0)
MCV: 87.3 fL (ref 80.0–100.0)
Monocytes Absolute: 0.5 10*3/uL (ref 0.1–1.0)
Monocytes Relative: 12 %
Neutro Abs: 2.3 10*3/uL (ref 1.7–7.7)
Neutrophils Relative %: 51 %
Platelets: 200 10*3/uL (ref 150–400)
RBC: 4.72 MIL/uL (ref 3.87–5.11)
RDW: 13 % (ref 11.5–15.5)
WBC: 4.4 10*3/uL (ref 4.0–10.5)
nRBC: 0 % (ref 0.0–0.2)

## 2023-03-05 LAB — COMPREHENSIVE METABOLIC PANEL
ALT: 20 U/L (ref 0–44)
AST: 25 U/L (ref 15–41)
Albumin: 4.1 g/dL (ref 3.5–5.0)
Alkaline Phosphatase: 73 U/L (ref 38–126)
Anion gap: 11 (ref 5–15)
BUN: 11 mg/dL (ref 8–23)
CO2: 22 mmol/L (ref 22–32)
Calcium: 9.4 mg/dL (ref 8.9–10.3)
Chloride: 103 mmol/L (ref 98–111)
Creatinine, Ser: 1.2 mg/dL — ABNORMAL HIGH (ref 0.44–1.00)
GFR, Estimated: 48 mL/min — ABNORMAL LOW (ref >60)
Glucose, Bld: 108 mg/dL — ABNORMAL HIGH (ref 70–99)
Potassium: 3.9 mmol/L (ref 3.5–5.1)
Sodium: 136 mmol/L (ref 135–145)
Total Bilirubin: 0.6 mg/dL (ref 0.0–1.2)
Total Protein: 8.3 g/dL — ABNORMAL HIGH (ref 6.5–8.1)

## 2023-03-05 LAB — RESP PANEL BY RT-PCR (RSV, FLU A&B, COVID)  RVPGX2
Influenza A by PCR: POSITIVE — AB
Influenza B by PCR: NEGATIVE
Resp Syncytial Virus by PCR: NEGATIVE
SARS Coronavirus 2 by RT PCR: NEGATIVE

## 2023-03-05 LAB — GROUP A STREP BY PCR: Group A Strep by PCR: NOT DETECTED

## 2023-03-05 LAB — TROPONIN I (HIGH SENSITIVITY): Troponin I (High Sensitivity): 5 ng/L (ref <18)

## 2023-03-05 MED ORDER — NAPROXEN 250 MG PO TABS: 250.0000 mg | ORAL_TABLET | Freq: Two times a day (BID) | ORAL | 0 refills | Status: DC

## 2023-03-05 NOTE — ED Notes (Addendum)
 Patient discharged. Provider spoke to patient. Paperwork given to patient and reviewed. Pt verbalized understanding. VSS. A+Ox4. Patient ambulated out of the ER with steady, independent gait with cane. No IV in place.

## 2023-03-05 NOTE — ED Provider Notes (Signed)
  EMERGENCY DEPARTMENT AT [facility] Provider Note   CSN: [id number] Arrival date & time: 03/05/23  5621     History  Chief Complaint  Patient presents with   Sore Throat   Headache    [name] is a 73 y.o. female.  Patient is a 73 year old female with a past medical history of hypertension, hyperlipidemia, diabetes who presents to the emergency department with a chief complaint of sore throat, cough, congestion, headache, chest pain which has been ongoing for approximate the past 3 days now.  Patient notes that she was evaluated at an urgent care yesterday and placed on Flonase, Zyrtec, doxycycline.  She notes that she is only taken 1 dose of these medications but notes that she was feeling worse this morning which prompted her visit to the emergency department.  Patient notes that she has had no associated shortness of breath.  She denies any abdominal pain, nausea, vomiting, diarrhea.  She has had no associated dizziness, lightheadedness, syncope.   Sore Throat Associated symptoms include headaches.  Headache Associated symptoms: cough and sinus pressure        Home Medications Prior to Admission medications   Medication Sig Start Date End Date Taking? Authorizing Provider  Accu-Chek Softclix Lancets lancets TEST BLOOD SUGAR TWO TIMES DAILY FOR DIABETES 09/11/21   Babs Sciara, MD  Blood Glucose Monitoring Suppl (ACCU-CHEK GUIDE ME) w/Device KIT  11/02/20   [provider]  glucose blood (ACCU-CHEK GUIDE) test strip TEST BLOOD SUGAR TWO TIMES DAILY FOR DIABETES 01/01/22   Babs Sciara, MD  metFORMIN (GLUCOPHAGE) 500 MG tablet Take 1/2 tablet po BID 11/04/22   Luking, Scott A, MD  rosuvastatin (CRESTOR) 20 MG tablet Take 1 tablet (20 mg total) by mouth daily. 10/20/22   Babs Sciara, MD  tirzepatide [facility]) 12.5 MG/0.5ML Pen Inject 12.5 mg into skin weekly 04/01/22   Babs Sciara, MD      Allergies    Penicillins, Tramadol,  and Hydrocodone    Review of Systems   Review of Systems  HENT:  Positive for sinus pressure.   Respiratory:  Positive for cough.   Neurological:  Positive for headaches.  All other systems reviewed and are negative.   Physical Exam Updated Vital Signs BP 112/72 (BP Location: Right Arm)   Pulse 98   Temp 98.9 F (37.2 C) (Oral)   Resp 16   Ht 5\' 7"  (1.702 m)   Wt 115.7 kg   SpO2 98%   BMI 39.94 kg/m  Physical Exam Vitals reviewed.  Constitutional:      Appearance: Normal appearance.  HENT:     Head: Normocephalic and atraumatic.     Right Ear: Tympanic membrane normal. No drainage.     Left Ear: Tympanic membrane normal. No drainage.     Nose: Congestion and rhinorrhea present.     Mouth/Throat:     Mouth: Mucous membranes are moist. No oral lesions.     Pharynx: Posterior oropharyngeal erythema present. No pharyngeal swelling or oropharyngeal exudate.  Eyes:     Extraocular Movements: Extraocular movements intact.     Conjunctiva/sclera: Conjunctivae normal.     Pupils: Pupils are equal, round, and reactive to light.  Cardiovascular:     Rate and Rhythm: Normal rate and regular rhythm.     Pulses: Normal pulses.     Heart sounds: Normal heart sounds.  Pulmonary:     Effort: Pulmonary effort is normal. No respiratory distress.  Breath sounds: Normal breath sounds. No stridor. No wheezing, rhonchi or rales.  Abdominal:     General: Abdomen is flat. Bowel sounds are normal. There is no distension.     Palpations: Abdomen is soft.     Tenderness: There is no abdominal tenderness.  Musculoskeletal:        General: Normal range of motion.     Cervical back: Normal range of motion and neck supple.  Skin:    General: Skin is warm and dry.     Findings: No rash.  Neurological:     General: No focal deficit present.     Mental Status: She is alert and oriented to person, place, and time. Mental status is at baseline.  Psychiatric:        Mood and Affect: Mood  normal.        Behavior: Behavior normal.        Thought Content: Thought content normal.        Judgment: Judgment normal.     ED Results / Procedures / Treatments   Labs (all labs ordered are listed, but only abnormal results are displayed) Labs Reviewed  GROUP A STREP BY PCR  RESP PANEL BY RT-PCR (RSV, FLU A&B, COVID)  [id number]  COMPREHENSIVE METABOLIC PANEL  CBC WITH DIFFERENTIAL/PLATELET  TROPONIN I (HIGH SENSITIVITY)    EKG None  Radiology No results found.  Procedures Procedures    Medications Ordered in ED Medications - No data to display  ED Course/ Medical Decision Making/ A&P                                 Medical Decision Making   This patient presents to the ED for concern of cough, sore throat, headache, chest pain differential diagnosis includes viral syndrome, pneumonia, ACS, pulmonary embolus, pericarditis, myocarditis    Additional history obtained:  Additional history obtained from medical records External records from outside source obtained and reviewed including none   Lab Tests:  I Ordered, and personally interpreted labs.  The pertinent results include: Influenza a positive, negative troponin   Imaging Studies ordered:  I ordered imaging studies including chest x-ray I independently visualized and interpreted imaging which showed no acute focal consolidation I agree with the radiologist interpretation   Medicines ordered and prescription drug management:  I ordered medication including NSAIDs for chest pain Reevaluation of the patient after these medicines showed that the patient improved I have reviewed the patients home medicines and have made adjustments as needed   Problem List / ED Course:  Is doing well at this time and is stable for discharge home.  Discussed with patient that she is positive for influenza A at this time.  Patient is outside of the window for Tamiflu and we will forego this medication.  She has  otherwise stable vital signs with no indication for sepsis no associated hypoxia.  EKG demonstrated no acute ischemic changes and she had a negative troponin.  Do not suspect ACS at this time.  Patient has no indication of peritonitis or myocarditis and symptoms are not positional in nature.  Do not suspect endocarditis, pulmonary embolus, aortic aneurysm or dissection.  Discussed the need for continued symptomatic treatment on an outpatient basis.  Close follow-up with primary care doctor was discussed as well as strict return precautions for any new or worsening symptoms.  Patient voiced understanding to the plan and had no additional questions.  Social Determinants of Health:  None       Amount and/or Complexity of Data Reviewed Labs: ordered. Radiology: ordered.  Risk Prescription drug management.           Final Clinical Impression(s) / ED Diagnoses Final diagnoses:  None    Rx / DC Orders ED Discharge Orders     None         Lelon Perla, PA-C 03/05/23 1124    Pricilla Loveless, MD 03/06/23 [phone]

## 2023-03-05 NOTE — Discharge Instructions (Signed)
 Please follow-up closely with your primary care doctor on an outpatient basis.  Return to emergency department immediately for any new or worsening symptoms.  Please continue symptomatic treatment on outpatient basis.

## 2023-03-05 NOTE — ED Triage Notes (Signed)
 Pt has had sore throat and headache starting Tuesday. Pt reported she went to UC yesterday and was tested for flu/covid with negative results. Pt stated UC gave her an antibiotic, zyrtec and flonase, but no diagnosis. Pt has been unable to get relief from any measures taken.

## 2023-03-05 NOTE — ED Notes (Signed)
 See triage notes. Pt a/o. Ambulatory. Gen weakness noted but no lethargy. Able to eat and drink but states no appetite. Mm moist.

## 2023-03-05 NOTE — Telephone Encounter (Signed)
 Copied from CRM [id number]. Topic: Clinical - Red Word Triage >> Mar 05, 2023  8:07 AM Geroge Baseman wrote: Red Word that prompted transfer to Nurse Triage: Pain head and going down to neck, Tuesday morning started, had trouble breathing, coughing a lot, lots of phlegm. Tuesday night pain started. Saw urgent care yesterday and did not help her.  Chief Complaint: headache that travels down neck Symptoms: pain, crying Frequency: constant Pertinent Negatives: Patient denies fever, cp, dizziness, numbness, tingling Disposition: [x] ED /[] Urgent Care (no appt availability in office) / [] Appointment(In office/virtual)/ []  [facility]/ [] Home Care/ [] Refused Recommended Disposition /[] [place] [facility]/ []  Follow-up with PCP Additional Notes: states has never had headache like this.  Was in uc yesterday and negative for covid 19 and pneumonia.  Instructed to go to er, hx DM, HTN.  PCP office updated.   Reason for Disposition  [1] SEVERE headache (e.g., excruciating) AND [2] "worst headache" of life  Answer Assessment - Initial Assessment Questions 1. LOCATION: "Where does it hurt?"      Top goes down to neck 2. ONSET: "When did the headache start?" (Minutes, hours or days)      Tuesday am 3. PATTERN: "Does the pain come and go, or has it been constant since it started?"     constant 4. SEVERITY: "How bad is the pain?" and "What does it keep you from doing?"  (e.g., Scale 1-10; mild, moderate, or severe)   - MILD (1-3): doesn't interfere with normal activities    - MODERATE (4-7): interferes with normal activities or awakens from sleep    - SEVERE (8-10): excruciating pain, unable to do any normal activities        10/10 5. RECURRENT SYMPTOM: "Have you ever had headaches before?" If Yes, ask: "When was the last time?" and "What happened that time?"      denies 6. CAUSE: "What do you think is causing the headache?"     unknown 7. MIGRAINE: "Have you been diagnosed with migraine  headaches?" If Yes, ask: "Is this headache similar?"      denies 8. HEAD INJURY: "Has there been any recent injury to the head?"      denies 9. OTHER SYMPTOMS: "Do you have any other symptoms?" (fever, stiff neck, eye pain, sore throat, cold symptoms)     denies 10. PREGNANCY: "Is there any chance you are pregnant?" "When was your last menstrual period?"       na  Protocols used: [facility]

## 2023-03-12 ENCOUNTER — Ambulatory Visit (INDEPENDENT_AMBULATORY_CARE_PROVIDER_SITE_OTHER): Payer: Medicare Other | Admitting: Nurse Practitioner

## 2023-03-12 VITALS — BP 133/78 | HR 71 | Temp 97.3°F | Ht 67.0 in | Wt 260.8 lb

## 2023-03-12 DIAGNOSIS — J101 Influenza due to other identified influenza virus with other respiratory manifestations: Secondary | ICD-10-CM

## 2023-03-14 ENCOUNTER — Encounter: Payer: Self-pay | Admitting: Nurse Practitioner

## 2023-03-14 NOTE — Progress Notes (Signed)
   Subjective:    Patient ID: [name], female    DOB: [birth year], 73 y.o.   MRN: [id number]  HPI Presents for follow-up after being diagnosed with influenza A on 03/05/2023.  At this point, no fever, sore throat, ear pain.  No chest pain or shortness of breath.  No wheezing.  Still has some cough but improved.  Producing clear to yellow mucus at times.  Completed a course of doxycycline.  Note that her blood sugars have been normal.  Taking fluids well.  Voiding normal limit.  Has been taking Robitussin for her symptoms which works great.  Energy is slowly improving.      Objective:   Physical Exam NAD.  Alert, oriented.  Calm cheerful affect.  TMs mild clear effusion, no erythema.  Pharynx clear and moist.  Neck supple with mild soft anterior cervical adenopathy.  Lungs clear.  Occasional nonproductive cough noted.  No tachypnea.  Heart regular rate rhythm. Today's Vitals   03/12/23 1501  BP: 133/78  Pulse: 71  Temp: (!) 97.3 F (36.3 C)  SpO2: 100%  Weight: 260 lb 12.8 oz (118.3 kg)  Height: 5\' 7"  (1.702 m)   Body mass index is 40.85 kg/m.        Assessment & Plan:  Influenza A Continue Robitussin DM as directed.  Expect continued improvement in her symptoms.  Warning signs reviewed.  Call back if worsens or persists.

## 2023-04-05 ENCOUNTER — Emergency Department (HOSPITAL_COMMUNITY)
Admission: EM | Admit: 2023-04-05 | Discharge: 2023-04-05 | Disposition: A | Discharge status: Home / Self Care | Attending: Emergency Medicine | Admitting: Emergency Medicine

## 2023-04-05 ENCOUNTER — Emergency Department (HOSPITAL_COMMUNITY)

## 2023-04-05 ENCOUNTER — Other Ambulatory Visit: Payer: Self-pay

## 2023-04-05 ENCOUNTER — Encounter (HOSPITAL_COMMUNITY): Payer: Self-pay

## 2023-04-05 DIAGNOSIS — Z79899 Other long term (current) drug therapy: Secondary | ICD-10-CM | POA: Diagnosis not present

## 2023-04-05 DIAGNOSIS — Z7984 Long term (current) use of oral hypoglycemic drugs: Secondary | ICD-10-CM | POA: Diagnosis not present

## 2023-04-05 DIAGNOSIS — M19022 Primary osteoarthritis, left elbow: Secondary | ICD-10-CM | POA: Insufficient documentation

## 2023-04-05 DIAGNOSIS — M19029 Primary osteoarthritis, unspecified elbow: Secondary | ICD-10-CM

## 2023-04-05 DIAGNOSIS — E119 Type 2 diabetes mellitus without complications: Secondary | ICD-10-CM | POA: Insufficient documentation

## 2023-04-05 DIAGNOSIS — I10 Essential (primary) hypertension: Secondary | ICD-10-CM | POA: Insufficient documentation

## 2023-04-05 DIAGNOSIS — M25522 Pain in left elbow: Secondary | ICD-10-CM | POA: Diagnosis not present

## 2023-04-05 MED ORDER — MELOXICAM 15 MG PO TABS: 15.0000 mg | ORAL_TABLET | Freq: Every day | ORAL | 0 refills | Status: DC

## 2023-04-05 MED ORDER — DEXAMETHASONE SODIUM PHOSPHATE 10 MG/ML IJ SOLN
10.0000 mg | Freq: Once | INTRAMUSCULAR | Status: AC
  Administered 2023-04-05: 10 mg via INTRAMUSCULAR
  Filled 2023-04-05: qty 1

## 2023-04-05 MED ORDER — KETOROLAC TROMETHAMINE 30 MG/ML IJ SOLN
30.0000 mg | Freq: Once | INTRAMUSCULAR | Status: AC
  Administered 2023-04-05: 30 mg via INTRAMUSCULAR
  Filled 2023-04-05: qty 1

## 2023-04-05 NOTE — ED Triage Notes (Signed)
 Pt reports her left elbow started to hurt walking into church today.  Pt denies any injury but says it hurts to bend her arm.

## 2023-04-05 NOTE — ED Provider Notes (Signed)
 [place] EMERGENCY DEPARTMENT AT [facility] Provider Note   CSN: [id number] Arrival date & time: 04/05/23  1441     History  Chief Complaint  Patient presents with   Joint Pain    [name] is a 73 y.o. female.  Pt is a 72 yo female with pmhx significant for DM, HLD, HTN, and arthritis.  Pt said she developed left elbow pain while walking into church today.  No trauma.  It hurts to bend.       Home Medications Prior to Admission medications   Medication Sig Start Date End Date Taking? Authorizing Provider  meloxicam (MOBIC) 15 MG tablet Take 1 tablet (15 mg total) by mouth daily. 04/05/23  Yes Jacalyn Lefevre, MD  Accu-Chek Softclix Lancets lancets TEST BLOOD SUGAR TWO TIMES DAILY FOR DIABETES 09/11/21   Babs Sciara, MD  Blood Glucose Monitoring Suppl (ACCU-CHEK GUIDE ME) w/Device KIT  11/02/20   [provider]  glucose blood (ACCU-CHEK GUIDE) test strip TEST BLOOD SUGAR TWO TIMES DAILY FOR DIABETES 01/01/22   Babs Sciara, MD  metFORMIN (GLUCOPHAGE) 500 MG tablet Take 1/2 tablet po BID 11/04/22   Babs Sciara, MD  naproxen (NAPROSYN) 250 MG tablet Take 1 tablet (250 mg total) by mouth 2 (two) times daily with a meal. 03/05/23   Huston Foley D, PA-C  rosuvastatin (CRESTOR) 20 MG tablet Take 1 tablet (20 mg total) by mouth daily. 10/20/22   Babs Sciara, MD  tirzepatide Greggory Keen) 12.5 MG/0.5ML Pen Inject 12.5 mg into skin weekly 04/01/22   Babs Sciara, MD      Allergies    Penicillins, Tramadol, and Hydrocodone    Review of Systems   Review of Systems  Musculoskeletal:        Left elbow pain  All other systems reviewed and are negative.   Physical Exam Updated Vital Signs BP (!) 150/81 (BP Location: Right Arm)   Pulse 77   Temp 98.5 F (36.9 C) (Oral)   Resp 18   Ht 5\' 7"  (1.702 m)   Wt 115.7 kg   SpO2 100%   BMI 39.94 kg/m  Physical Exam Vitals and nursing note reviewed.  Constitutional:      Appearance:  Normal appearance.  HENT:     Head: Normocephalic and atraumatic.     Right Ear: External ear normal.     Left Ear: External ear normal.     Nose: Nose normal.     Mouth/Throat:     Mouth: Mucous membranes are moist.     Pharynx: Oropharynx is clear.  Eyes:     Extraocular Movements: Extraocular movements intact.     Conjunctiva/sclera: Conjunctivae normal.     Pupils: Pupils are equal, round, and reactive to light.  Cardiovascular:     Rate and Rhythm: Normal rate and regular rhythm.     Pulses: Normal pulses.     Heart sounds: Normal heart sounds.  Pulmonary:     Effort: Pulmonary effort is normal.     Breath sounds: Normal breath sounds.  Musculoskeletal:       Arms:     Cervical back: Normal range of motion and neck supple.  Skin:    General: Skin is warm.     Capillary Refill: Capillary refill takes less than 2 seconds.  Neurological:     General: No focal deficit present.     Mental Status: She is alert and oriented to person, place, and time.  ED Results / Procedures / Treatments   Labs (all labs ordered are listed, but only abnormal results are displayed) Labs Reviewed - No data to display  EKG EKG Interpretation Date/Time:  Sunday April 05 2023 16:29:37 EDT Ventricular Rate:  72 PR Interval:  176 QRS Duration:  90 QT Interval:  400 QTC Calculation: 438 R Axis:   54  Text Interpretation: Sinus rhythm Low voltage, precordial leads No significant change since last tracing Confirmed by Lyzette Reinhardt ([id number]) on 04/05/2023 5:03:01 PM  Radiology DG Elbow Complete Left Result Date: 04/05/2023 CLINICAL DATA:  Left elbow pain.  No known injury. EXAM: LEFT ELBOW - COMPLETE 3+ VIEW COMPARISON:  None Available. FINDINGS: There is no evidence of fracture, dislocation, or joint effusion. Advanced elbow osteoarthritis noted. No other bone lesions identified. IMPRESSION: No acute findings.  Advanced osteoarthritis. Electronically Signed   By: John A Stahl M.D.   On:  04/05/2023 15:52    Procedures Procedures    Medications Ordered in ED Medications  ketorolac (TORADOL) 30 MG/ML injection 30 mg (30 mg Intramuscular Given 04/05/23 1710)  dexamethasone (DECADRON) injection 10 mg (10 mg Intramuscular Given 04/05/23 1711)    ED Course/ Medical Decision Making/ A&P                                 Medical Decision Making Amount and/or Complexity of Data Reviewed Radiology: ordered.  Risk Prescription drug management.   This patient presents to the ED for concern of left elbow pain, this involves an extensive number of treatment options, and is a complaint that carries with it a high risk of complications and morbidity.  The differential diagnosis includes fx, strain, infection, gout, arthritis   Co morbidities that complicate the patient evaluation  DM, HLD, HTN, and arthritis.   Additional history obtained:  Additional history obtained from epic chart review External records from outside source obtained and reviewed including family  Imaging Studies ordered:  I ordered imaging studies including left elbow  I independently visualized and interpreted imaging which showed No acute findings.  Advanced osteoarthritis.  I agree with the radiologist interpretation  Medicines ordered and prescription drug management:  I ordered medication including toradol/decadron  for sx  Reevaluation of the patient after these medicines showed that the patient improved I have reviewed the patients home medicines and have made adjustments as needed   Test Considered:  xr   Problem List / ED Course:  Left elbow pain:  likely due to arthritis.  Pt is stable for d/c.  She is to return if worse.  F/u with ortho.   Reevaluation:  After the interventions noted above, I reevaluated the patient and found that they have :improved   Social Determinants of Health:  Lives at home   Dispostion:  After consideration of the diagnostic results and the  patients response to treatment, I feel that the patent would benefit from discharge with outpatient f/u.          Final Clinical Impression(s) / ED Diagnoses Final diagnoses:  Elbow arthritis    Rx / DC Orders ED Discharge Orders          Ordered    meloxicam (MOBIC) 15 MG tablet  Daily        03 /23/25 1702              Jacalyn Lefevre, MD 04/05/23 2343

## 2023-04-09 ENCOUNTER — Ambulatory Visit: Payer: Medicare HMO | Admitting: Family Medicine

## 2023-04-22 ENCOUNTER — Ambulatory Visit (INDEPENDENT_AMBULATORY_CARE_PROVIDER_SITE_OTHER): Admitting: Orthopedic Surgery

## 2023-04-22 ENCOUNTER — Encounter: Payer: Self-pay | Admitting: Orthopedic Surgery

## 2023-04-22 VITALS — BP 127/66 | HR 71 | Ht 67.0 in | Wt 255.0 lb

## 2023-04-22 DIAGNOSIS — M19022 Primary osteoarthritis, left elbow: Secondary | ICD-10-CM

## 2023-04-22 MED ORDER — MELOXICAM 15 MG PO TABS: 15.0000 mg | ORAL_TABLET | Freq: Every day | ORAL | 5 refills | Status: DC

## 2023-04-22 NOTE — Patient Instructions (Signed)
 Take meloxicam for 6 weeks

## 2023-04-22 NOTE — Progress Notes (Signed)
  Intake history:  There were no vitals taken for this visit. There is no height or weight on file to calculate BMI.    WHAT ARE WE SEEING YOU FOR TODAY?   left elbow  How long has this bothered you? (DOI?DOS?WS?)  1 month(s) ago1  Anticoag.  No  Diabetes Yes  Heart disease No  Hypertension No  SMOKING HX No  Kidney disease No  Any ALLERGIES ______________________________________________   Treatment:  Have you taken:  Tylenol No  Advil No  Had PT No  Had injection No  Other  _________________________

## 2023-04-22 NOTE — Progress Notes (Signed)
 New problem established patient  Chief Complaint  Patient presents with   Elbow Injury    Went to church and felt pain in elbow.    Shoulder Pain    Assessment and plan 73 year old female with acute pain secondary to left elbow arthritis currently on meloxicam recommend 6 weeks of meloxicam then recheck  73 year old female presented to the ER after acute pain left elbow.  Her family thought she should go to the ER and she went for evaluation and management  She said she was opening a door and acute pain went up her left arm localized to her left elbow  Problem list, medical hx, medications and allergies reviewed    BP 127/66   Pulse 71   Ht 5\' 7"  (1.702 m)   Wt 255 lb (115.7 kg)   BMI 39.94 kg/m   Left elbow she has 20-120 degree range of motion with pain at terminal extension and flexion normal pronation supination tenderness around the elbow no effusion skin intact no signs of infection neurovascular exam normal flexion power normal  Outside imaging  I see a very bad elbow arthritis involving all parts of the elbow no fracture no bone tumors  Impression  Encounter Diagnosis  Name Primary?   Arthritis of left elbow Yes    Meds ordered this encounter  Medications   meloxicam (MOBIC) 15 MG tablet    Sig: Take 1 tablet (15 mg total) by mouth daily.    Dispense:  30 tablet    Refill:  5   Continue meloxicam for 6 weeks recheck elbow in 6 weeks

## 2023-04-28 ENCOUNTER — Other Ambulatory Visit: Payer: Self-pay | Admitting: Family Medicine

## 2023-05-04 ENCOUNTER — Telehealth: Payer: Self-pay

## 2023-05-04 DIAGNOSIS — E1169 Type 2 diabetes mellitus with other specified complication: Secondary | ICD-10-CM

## 2023-05-04 MED ORDER — ROSUVASTATIN CALCIUM 20 MG PO TABS
20.0000 mg | ORAL_TABLET | Freq: Every day | ORAL | 5 refills | Status: DC
Start: 2023-05-04 — End: 2023-05-06

## 2023-05-04 NOTE — Telephone Encounter (Signed)
 Prescription Request  05/04/2023  LOV: Visit date not found  What is the name of the medication or equipment? rosuvastatin  (CRESTOR ) 20 MG tablet   Have you contacted your pharmacy to request a refill? Yes   Which pharmacy would you like this sent to?   [facility]    Patient notified that their request is being sent to the clinical staff for review and that they should receive a response within 2 business days.   Please advise at Mobile [phone] (mobile)

## 2023-05-06 ENCOUNTER — Ambulatory Visit: Payer: Medicare HMO | Admitting: Family Medicine

## 2023-05-06 VITALS — BP 128/69 | HR 70 | Temp 98.1°F | Ht 67.0 in | Wt 258.4 lb

## 2023-05-06 DIAGNOSIS — I10 Essential (primary) hypertension: Secondary | ICD-10-CM

## 2023-05-06 DIAGNOSIS — E1159 Type 2 diabetes mellitus with other circulatory complications: Secondary | ICD-10-CM | POA: Diagnosis not present

## 2023-05-06 DIAGNOSIS — Z79899 Other long term (current) drug therapy: Secondary | ICD-10-CM | POA: Diagnosis not present

## 2023-05-06 DIAGNOSIS — L299 Pruritus, unspecified: Secondary | ICD-10-CM

## 2023-05-06 DIAGNOSIS — E119 Type 2 diabetes mellitus without complications: Secondary | ICD-10-CM | POA: Diagnosis not present

## 2023-05-06 DIAGNOSIS — E785 Hyperlipidemia, unspecified: Secondary | ICD-10-CM

## 2023-05-06 DIAGNOSIS — E1169 Type 2 diabetes mellitus with other specified complication: Secondary | ICD-10-CM

## 2023-05-06 DIAGNOSIS — Z7984 Long term (current) use of oral hypoglycemic drugs: Secondary | ICD-10-CM | POA: Diagnosis not present

## 2023-05-06 MED ORDER — HYDROCORTISONE 2.5 % EX CREA: TOPICAL_CREAM | CUTANEOUS | 0 refills | Status: DC

## 2023-05-06 MED ORDER — METFORMIN HCL 500 MG PO TABS: ORAL_TABLET | ORAL | 1 refills | Status: DC

## 2023-05-06 MED ORDER — MOUNJARO 12.5 MG/0.5ML ~~LOC~~ SOAJ: 12.5000 mg | SUBCUTANEOUS | 5 refills | Status: DC

## 2023-05-06 MED ORDER — ROSUVASTATIN CALCIUM 20 MG PO TABS
20.0000 mg | ORAL_TABLET | Freq: Every day | ORAL | 5 refills | Status: DC
Start: 2023-05-06 — End: 2023-05-11
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
## 2023-05-07 LAB — LIPID PANEL
Chol/HDL Ratio: 3.8 ratio (ref 0.0–4.4)
Cholesterol, Total: 204 mg/dL — ABNORMAL HIGH (ref 100–199)
HDL: 53 mg/dL (ref >39)
LDL Chol Calc (NIH): 135 mg/dL — ABNORMAL HIGH (ref 0–99)
Triglycerides: 87 mg/dL (ref 0–149)
VLDL Cholesterol Cal: 16 mg/dL (ref 5–40)

## 2023-05-07 LAB — BASIC METABOLIC PANEL WITH GFR
BUN/Creatinine Ratio: 16 (ref 12–28)
BUN: 19 mg/dL (ref 8–27)
CO2: 20 mmol/L (ref 20–29)
Calcium: 9.7 mg/dL (ref 8.7–10.3)
Chloride: 105 mmol/L (ref 96–106)
Creatinine, Ser: 1.17 mg/dL — ABNORMAL HIGH (ref 0.57–1.00)
Glucose: 94 mg/dL (ref 70–99)
Potassium: 4.3 mmol/L (ref 3.5–5.2)
Sodium: 141 mmol/L (ref 134–144)
eGFR: 50 mL/min/{1.73_m2} — ABNORMAL LOW (ref >59)

## 2023-05-07 LAB — HEPATIC FUNCTION PANEL
ALT: 21 IU/L (ref 0–32)
AST: 27 IU/L (ref 0–40)
Albumin: 4.4 g/dL (ref 3.8–4.8)
Alkaline Phosphatase: 97 IU/L (ref 44–121)
Bilirubin Total: 0.4 mg/dL (ref 0.0–1.2)
Bilirubin, Direct: 0.15 mg/dL (ref 0.00–0.40)
Total Protein: 7.5 g/dL (ref 6.0–8.5)

## 2023-05-07 LAB — MICROALBUMIN / CREATININE URINE RATIO
Creatinine, Urine: 310.8 mg/dL
Microalb/Creat Ratio: 14 mg/g{creat} (ref 0–29)
Microalbumin, Urine: 42.2 ug/mL

## 2023-05-07 LAB — HEMOGLOBIN A1C
Est. average glucose Bld gHb Est-mCnc: 131 mg/dL
Hgb A1c MFr Bld: 6.2 % — ABNORMAL HIGH (ref 4.8–5.6)

## 2023-05-11 ENCOUNTER — Other Ambulatory Visit: Payer: Self-pay

## 2023-05-11 ENCOUNTER — Telehealth: Payer: Self-pay | Admitting: Pharmacy Technician

## 2023-05-11 ENCOUNTER — Other Ambulatory Visit (HOSPITAL_COMMUNITY): Payer: Self-pay

## 2023-05-11 DIAGNOSIS — E1169 Type 2 diabetes mellitus with other specified complication: Secondary | ICD-10-CM

## 2023-05-11 DIAGNOSIS — N1831 Chronic kidney disease, stage 3a: Secondary | ICD-10-CM

## 2023-05-11 MED ORDER — ROSUVASTATIN CALCIUM 40 MG PO TABS
40.0000 mg | ORAL_TABLET | Freq: Every day | ORAL | 5 refills | Status: DC
Start: 2023-05-11 — End: 2023-11-11

## 2023-05-11 NOTE — Telephone Encounter (Signed)
 Pharmacy Patient Advocate Encounter   Received notification from Onbase that prior authorization for Mounjaro  12.5mg /0.74ml auto-injectors is required/requested.   Insurance verification completed.   The patient is insured through [employer] .   Per test claim: Refill too soon. PA is not needed at this time. Medication was filled 05/05/2023. Next eligible fill date is 05/26/2023.

## 2023-06-04 ENCOUNTER — Ambulatory Visit (INDEPENDENT_AMBULATORY_CARE_PROVIDER_SITE_OTHER): Admitting: Orthopedic Surgery

## 2023-06-04 DIAGNOSIS — M19022 Primary osteoarthritis, left elbow: Secondary | ICD-10-CM

## 2023-06-04 NOTE — Progress Notes (Signed)
 FOLLOW-UP OFFICE VISIT   Patient: [name]           Date of Birth: 05-20-1950           MRN: [id number] Visit Date: 06/04/2023 Requested by: Bennet Brasil, MD [address] [place],  [place] [address] PCP: Bennet Brasil, MD    Encounter Diagnosis  Name Primary?   Arthritis of left elbow Yes    Chief Complaint  Patient presents with   Elbow Pain    Left/ states still painful at times elbow into bicep    [name] has osteoarthritis of the left elbow we put her on meloxicam  she did get some improvement but still has some pain in the front of the arm over the left elbow joint she is not interested in surgical intervention she uses alcohol when it is hurting if she is home seems to bother her if she cannot get alcohol on it if she is out in the community  ASSESSMENT AND PLAN 73 year old female Arthritis left elbow Currently on meloxicam , continue Not interested in surgery Will return if needed

## 2023-06-04 NOTE — Progress Notes (Signed)
   There were no vitals taken for this visit.  There is no height or weight on file to calculate BMI.  Chief Complaint  Patient presents with   Elbow Pain    Left/ states still painful at times elbow into bicep    No diagnosis found.  DOI/DOS/ Date:    Unchanged/ does not want surgery / taking Meloxicam

## 2023-08-04 ENCOUNTER — Other Ambulatory Visit (HOSPITAL_COMMUNITY): Payer: Self-pay | Admitting: Family Medicine

## 2023-08-04 DIAGNOSIS — Z1231 Encounter for screening mammogram for malignant neoplasm of breast: Secondary | ICD-10-CM

## 2023-08-23 ENCOUNTER — Other Ambulatory Visit: Payer: Self-pay

## 2023-08-23 ENCOUNTER — Emergency Department (HOSPITAL_COMMUNITY)
Admission: EM | Admit: 2023-08-23 | Discharge: 2023-08-23 | Disposition: A | Discharge status: Home / Self Care | Attending: Emergency Medicine | Admitting: Emergency Medicine

## 2023-08-23 ENCOUNTER — Emergency Department (HOSPITAL_COMMUNITY)

## 2023-08-23 ENCOUNTER — Encounter (HOSPITAL_COMMUNITY): Payer: Self-pay | Admitting: Emergency Medicine

## 2023-08-23 DIAGNOSIS — M545 Low back pain, unspecified: Secondary | ICD-10-CM | POA: Insufficient documentation

## 2023-08-23 DIAGNOSIS — S3993XA Unspecified injury of pelvis, initial encounter: Secondary | ICD-10-CM | POA: Diagnosis not present

## 2023-08-23 DIAGNOSIS — S299XXA Unspecified injury of thorax, initial encounter: Secondary | ICD-10-CM | POA: Diagnosis not present

## 2023-08-23 DIAGNOSIS — E119 Type 2 diabetes mellitus without complications: Secondary | ICD-10-CM | POA: Insufficient documentation

## 2023-08-23 DIAGNOSIS — Y93E1 Activity, personal bathing and showering: Secondary | ICD-10-CM | POA: Insufficient documentation

## 2023-08-23 DIAGNOSIS — Z96651 Presence of right artificial knee joint: Secondary | ICD-10-CM | POA: Insufficient documentation

## 2023-08-23 DIAGNOSIS — Z7984 Long term (current) use of oral hypoglycemic drugs: Secondary | ICD-10-CM | POA: Diagnosis not present

## 2023-08-23 DIAGNOSIS — W182XXA Fall in (into) shower or empty bathtub, initial encounter: Secondary | ICD-10-CM | POA: Insufficient documentation

## 2023-08-23 DIAGNOSIS — M19011 Primary osteoarthritis, right shoulder: Secondary | ICD-10-CM | POA: Diagnosis not present

## 2023-08-23 DIAGNOSIS — I700 Atherosclerosis of aorta: Secondary | ICD-10-CM | POA: Insufficient documentation

## 2023-08-23 DIAGNOSIS — E785 Hyperlipidemia, unspecified: Secondary | ICD-10-CM | POA: Diagnosis not present

## 2023-08-23 DIAGNOSIS — R0781 Pleurodynia: Secondary | ICD-10-CM | POA: Insufficient documentation

## 2023-08-23 DIAGNOSIS — M25512 Pain in left shoulder: Secondary | ICD-10-CM | POA: Diagnosis not present

## 2023-08-23 DIAGNOSIS — M25511 Pain in right shoulder: Secondary | ICD-10-CM | POA: Insufficient documentation

## 2023-08-23 DIAGNOSIS — S22048A Other fracture of fourth thoracic vertebra, initial encounter for closed fracture: Secondary | ICD-10-CM | POA: Diagnosis not present

## 2023-08-23 DIAGNOSIS — Z96642 Presence of left artificial hip joint: Secondary | ICD-10-CM | POA: Diagnosis not present

## 2023-08-23 DIAGNOSIS — I10 Essential (primary) hypertension: Secondary | ICD-10-CM | POA: Insufficient documentation

## 2023-08-23 DIAGNOSIS — Z043 Encounter for examination and observation following other accident: Secondary | ICD-10-CM | POA: Diagnosis not present

## 2023-08-23 DIAGNOSIS — M1611 Unilateral primary osteoarthritis, right hip: Secondary | ICD-10-CM | POA: Diagnosis not present

## 2023-08-23 DIAGNOSIS — S3992XA Unspecified injury of lower back, initial encounter: Secondary | ICD-10-CM | POA: Diagnosis not present

## 2023-08-23 DIAGNOSIS — W19XXXA Unspecified fall, initial encounter: Secondary | ICD-10-CM

## 2023-08-23 MED ORDER — FENTANYL CITRATE (PF) 100 MCG/2ML IJ SOLN
50.0000 ug | Freq: Once | INTRAMUSCULAR | Status: AC
  Administered 2023-08-23: 50 ug via INTRAMUSCULAR

## 2023-08-23 MED ORDER — OXYCODONE-ACETAMINOPHEN 5-325 MG PO TABS: 1.0000 | ORAL_TABLET | Freq: Four times a day (QID) | ORAL | 0 refills | Status: DC | PRN

## 2023-08-23 MED ORDER — FENTANYL CITRATE (PF) 100 MCG/2ML IJ SOLN
50.0000 ug | Freq: Once | INTRAMUSCULAR | Status: DC
  Filled 2023-08-23: qty 2

## 2023-08-23 NOTE — ED Triage Notes (Signed)
 Pt via POV c/o severe lower back and right posterior shoulder pain after falling in the shower today at around 0815am. Denies hitting head; no thinners. Pt is in tears at time of triage.

## 2023-08-23 NOTE — ED Notes (Addendum)
 Pt take home percocet 6-pack supply given to pt with medication administration instructions.

## 2023-08-23 NOTE — ED Provider Notes (Signed)
 [place] EMERGENCY DEPARTMENT AT [facility] Provider Note   CSN: [id number] Arrival date & time: 08/23/23  1556     Patient presents with: Back Pain and Fall   [name] is a 73 y.o. female with history of hyperlipidemia, type 2 diabetes presents following mechanical fall presents with complaints of right shoulder, right rib and low back pain.  Patient states she slipped in the shower and fell.  She denies hitting her head or loss of consciousness.  She is not on blood thinners.  Denies any headache, vomiting, dizziness, extremity weakness or numbness.  She states that she has been able to ambulate.  This occurred earlier today.    Back Pain Fall   Past Medical History:  Diagnosis Date   Arthritis    Essential hypertension    Finger amputation, traumatic Age 81   [place]   Hyperlipidemia    Obesity    PONV (postoperative nausea and vomiting)    Type 2 diabetes mellitus (HCC)    Type 2 diabetes mellitus with diabetic neuropathy, unspecified (HCC) 02/04/2018   Past Surgical History:  Procedure Laterality Date   ABDOMINAL HYSTERECTOMY     CATARACT EXTRACTION W/PHACO Right 02/25/2021   Procedure: CATARACT EXTRACTION PHACO AND INTRAOCULAR LENS PLACEMENT (IOC);  Surgeon: Harrie Agent, MD;  Location: [facility];  Service: Ophthalmology;  Laterality: Right;  10.45   CATARACT EXTRACTION W/PHACO Left 03/11/2021   Procedure: CATARACT EXTRACTION PHACO AND INTRAOCULAR LENS PLACEMENT (IOC);  Surgeon: Harrie Agent, MD;  Location: [facility];  Service: Ophthalmology;  Laterality: Left;  CDE: 8.82   COLONOSCOPY  12/2008   Dr. Harvey: slightly tortuous colon. internal hemorrhoids. next colonoscopy in 10 years.    COLONOSCOPY WITH PROPOFOL  N/A 12/24/2020   Procedure: COLONOSCOPY WITH PROPOFOL ;  Surgeon: Cindie Carlin POUR, DO;  Location: [facility];  Service: Endoscopy;  Laterality: N/A;  8:00am   HERNIA REPAIR     x2   OOPHORECTOMY     POLYPECTOMY  12/24/2020   Procedure:  POLYPECTOMY;  Surgeon: Cindie Carlin POUR, DO;  Location: [facility];  Service: Endoscopy;;   REPLACEMENT TOTAL KNEE Right    TOTAL HIP ARTHROPLASTY Left 2008?   [facility], [place]   TOTAL KNEE ARTHROPLASTY  09/09/2010   Procedure: TOTAL KNEE ARTHROPLASTY;  Surgeon: Taft Minerva, MD;  Location: [facility];  Service: Orthopedics;  Laterality: Left;  With DePuy       Prior to Admission medications   Medication Sig Start Date End Date Taking? Authorizing Provider  Accu-Chek Softclix Lancets lancets TEST BLOOD SUGAR TWO TIMES DAILY FOR DIABETES 09/11/21   Alphonsa Glendia LABOR, MD  Blood Glucose Monitoring Suppl (ACCU-CHEK GUIDE ME) w/Device KIT  11/02/20   [provider]  glucose blood (ACCU-CHEK GUIDE) test strip TEST BLOOD SUGAR TWO TIMES DAILY FOR DIABETES 01/01/22   Alphonsa Glendia LABOR, MD  hydrocortisone  2.5 % cream Apply thin amount twice daily to the scalp as needed for itching 05/06/23   Alphonsa Glendia LABOR, MD  meloxicam  (MOBIC ) 15 MG tablet Take 1 tablet (15 mg total) by mouth daily. 04/22/23   Minerva Taft BRAVO, MD  metFORMIN  (GLUCOPHAGE ) 500 MG tablet Take 1/2 tablet po BID 05/06/23   Alphonsa Glendia LABOR, MD  rosuvastatin  (CRESTOR ) 40 MG tablet Take 1 tablet (40 mg total) by mouth daily. 05/11/23   Alphonsa Glendia LABOR, MD  tirzepatide  (MOUNJARO ) 12.5 MG/0.5ML Pen Inject 12.5 mg into the skin once a week. 05/06/23   Alphonsa Glendia LABOR, MD  Allergies: Penicillins, Tramadol , and Hydrocodone     Review of Systems  Musculoskeletal:  Positive for back pain.    Updated Vital Signs BP 125/65   Pulse 79   Temp 98 F (36.7 C) (Oral)   Resp (!) 22 Comment: due to pain  Ht 5' 7 (1.702 m)   Wt 113.4 kg   SpO2 99%   BMI 39.16 kg/m   Physical Exam Vitals and nursing note reviewed.  Constitutional:      Appearance: She is well-developed.     Comments: Patient is tearful and appearing uncomfortable during exam  HENT:     Head: Normocephalic and atraumatic.  Eyes:     Conjunctiva/sclera:  Conjunctivae normal.  Neck:     Comments: No midline cervical tenderness, tolerates full cervical range of motion without discomfort Cardiovascular:     Rate and Rhythm: Normal rate and regular rhythm.     Heart sounds: No murmur heard. Pulmonary:     Effort: Pulmonary effort is normal. No respiratory distress.     Breath sounds: Normal breath sounds.  Abdominal:     Palpations: Abdomen is soft.     Tenderness: There is no abdominal tenderness.  Musculoskeletal:        General: No swelling.     Cervical back: Neck supple.     Comments: Tenderness over right shoulder without any gross deformities, maintained in guarded position, tenderness over right ribs, no overlying ecchymosis, generalized lumbar paraspinal midline tenderness, 5 out of 5 lower extremity strength, tolerates full range of motion of lower extremities, no hip tenderness  Skin:    General: Skin is warm and dry.     Capillary Refill: Capillary refill takes less than 2 seconds.  Neurological:     General: No focal deficit present.     Mental Status: She is alert and oriented to person, place, and time.     Comments: GCS 15  Psychiatric:        Mood and Affect: Mood normal.     (all labs ordered are listed, but only abnormal results are displayed) Labs Reviewed - No data to display  EKG: None  Radiology: DG Pelvis Portable Result Date: 08/23/2023 CLINICAL DATA:  fall EXAM: PORTABLE PELVIS 1-2 VIEWS COMPARISON:  X-ray pelvis 06/09/2006 FINDINGS: Total left hip arthroplasty. No radiographic findings suggest surgical hardware complication. At least mild degenerative changes of the right hip. There is no evidence of pelvic fracture or diastasis. No acute displaced fracture or dislocation of either hips. No pelvic bone lesions are seen.3 Tacks overlying the lower abdomen likely related to hernia repair. IMPRESSION: Negative for acute traumatic injury. Electronically Signed   By: Morgane  Naveau M.D.   On: 08/23/2023 17:42    DG Chest Portable 1 View Result Date: 08/23/2023 CLINICAL DATA:  fall EXAM: PORTABLE CHEST 1 VIEW COMPARISON:  Chest x-ray 03/05/2023 FINDINGS: The heart and mediastinal contours are within normal limits. No focal consolidation. No pulmonary edema. No pleural effusion. No pneumothorax. No acute osseous abnormality. Bilateral at least moderate shoulder degenerative changes. IMPRESSION: No active disease. Electronically Signed   By: Morgane  Naveau M.D.   On: 08/23/2023 17:39   DG Shoulder Right Result Date: 08/23/2023 CLINICAL DATA:  fall EXAM: RIGHT SHOULDER - 2+ VIEW COMPARISON:  None Available. FINDINGS: There is no evidence of fracture or dislocation. At least moderate acromioclavicular and glenohumeral joint degenerative changes. Soft tissues are unremarkable. IMPRESSION: No acute displaced fracture or dislocation. Electronically Signed   By: Morgane  Naveau M.D.  On: 08/23/2023 17:37   CT Lumbar Spine Wo Contrast Result Date: 08/23/2023 CLINICAL DATA:  Back trauma, no prior imaging (Age >= 16y) EXAM: CT THORACIC AND LUMBAR SPINE WITHOUT CONTRAST TECHNIQUE: Multidetector CT imaging of the thoracic and lumbar spine was performed without contrast. Multiplanar CT image reconstructions were also generated. RADIATION DOSE REDUCTION: This exam was performed according to the departmental dose-optimization program which includes automated exposure control, adjustment of the mA and/or kV according to patient size and/or use of iterative reconstruction technique. COMPARISON:  X-ray lumbar spine 07/03/2017. FINDINGS: CT THORACIC SPINE FINDINGS Alignment: Normal. Vertebrae: Multilevel mild moderate anterior osteophyte formation. No severe osseous foraminal or central canal stenosis. Trace superior endplate mild T4 compression fracture. No no acute fracture or focal pathologic process. Paraspinal and other soft tissues: Negative. Disc levels: Maintained. CT LUMBAR SPINE FINDINGS Segmentation: 5 lumbar type  vertebrae. Alignment: Stable grade 1 anterolisthesis of L4 on L5. Vertebrae: Multilevel facet arthropathy. Mild anterior osteophyte formation. No acute fracture or focal pathologic process. Paraspinal and other soft tissues: Negative. Disc levels: Maintained. Other: Atherosclerotic plaque. Partially visualized left hip arthroplasty. IMPRESSION: CT THORACIC SPINE IMPRESSION 1. Trace superior endplate mild T4 compression fracture. CT LUMBAR SPINE IMPRESSION 1. No acute displaced fracture or traumatic listhesis of the cervical spine. Other imaging findings of potential clinical significance: 1. Aortic Atherosclerosis (ICD10-I70.0). Electronically Signed   By: Morgane  Naveau M.D.   On: 08/23/2023 17:34   CT Thoracic Spine Wo Contrast Result Date: 08/23/2023 CLINICAL DATA:  Back trauma, no prior imaging (Age >= 16y) EXAM: CT THORACIC AND LUMBAR SPINE WITHOUT CONTRAST TECHNIQUE: Multidetector CT imaging of the thoracic and lumbar spine was performed without contrast. Multiplanar CT image reconstructions were also generated. RADIATION DOSE REDUCTION: This exam was performed according to the departmental dose-optimization program which includes automated exposure control, adjustment of the mA and/or kV according to patient size and/or use of iterative reconstruction technique. COMPARISON:  X-ray lumbar spine 07/03/2017. FINDINGS: CT THORACIC SPINE FINDINGS Alignment: Normal. Vertebrae: Multilevel mild moderate anterior osteophyte formation. No severe osseous foraminal or central canal stenosis. Trace superior endplate mild T4 compression fracture. No no acute fracture or focal pathologic process. Paraspinal and other soft tissues: Negative. Disc levels: Maintained. CT LUMBAR SPINE FINDINGS Segmentation: 5 lumbar type vertebrae. Alignment: Stable grade 1 anterolisthesis of L4 on L5. Vertebrae: Multilevel facet arthropathy. Mild anterior osteophyte formation. No acute fracture or focal pathologic process. Paraspinal and  other soft tissues: Negative. Disc levels: Maintained. Other: Atherosclerotic plaque. Partially visualized left hip arthroplasty. IMPRESSION: CT THORACIC SPINE IMPRESSION 1. Trace superior endplate mild T4 compression fracture. CT LUMBAR SPINE IMPRESSION 1. No acute displaced fracture or traumatic listhesis of the cervical spine. Other imaging findings of potential clinical significance: 1. Aortic Atherosclerosis (ICD10-I70.0). Electronically Signed   By: Morgane  Naveau M.D.   On: 08/23/2023 17:34     Procedures   Medications Ordered in the ED  fentaNYL  (SUBLIMAZE ) injection 50 mcg (50 mcg Intramuscular Given 08/23/23 1643)                                    Medical Decision Making Amount and/or Complexity of Data Reviewed Radiology: ordered.  Risk Prescription drug management.   This patient presents to the ED with chief complaint(s) of fall .  The complaint involves an extensive differential diagnosis and also carries with it a high risk of complications and morbidity.   Pertinent past medical history  as listed in HPI  The differential diagnosis includes  Intracranial hemorrhage, TBI, fracture, dislocation, sprain Additional history obtained: Additional history obtained from family Records reviewed Care Everywhere/External Records  Assessment and management:   Patient presents following mechanical fall in the shower with complaints of right shoulder, right rib and low back pain.  On exam patient is tearful and appears uncomfortable, she has no gross deformities noted on exam she does have generalized tenderness to right shoulder, right ribs and lumbar spine including midline and paraspinal regions.  She has no no radicular symptoms.  No urinary incontinence.  She states she has been able to ambulate today.  She denies hitting her head or loss consciousness.  She has no neurodeficits on exam.  No concerning symptoms to suggest intracranial abnormality and is not on blood thinners.  She  has no cervical midline tenderness and tolerates full cervical range of motion without discomfort.  Head and CT imaging not indicated today.  Will obtain right shoulder, chest and pelvic x-ray and CT imaging of lumbar and thoracic spine.  CT imaging consistent with trace compression fracture of T4.  On exam patient has minimal tenderness to the area.  She is able to ambulate.  She states she feels much better.  However she has notable tenderness to the left iliac crest.  She continues to deny any chest or abdomen tenderness.  Will obtain CT imaging pelvis.  Independent ECG interpretation:  none  Independent labs interpretation:  The following labs were independently interpreted:  none  Independent visualization and interpretation of imaging: I independently visualized the following imaging with scope of interpretation limited to determining acute life threatening conditions related to emergency care:  Pelvis x-ray   Consultations obtained:   None at this time  Disposition:   Signout given to Dorn Dec, PA-C.  Please see his note for the remainder of the visit.  Disposition pending workup.  Social Determinants of Health:   none  This note was dictated with voice recognition software.  Despite best efforts at proofreading, errors may have occurred which can change the documentation meaning.       Final diagnoses:  Fall, initial encounter    ED Discharge Orders     None          Donnajean Lynwood VEAR DEVONNA 08/23/23 1855    Towana Ozell BROCKS, MD 08/24/23 1059

## 2023-08-23 NOTE — ED Provider Notes (Signed)
  Physical Exam  BP 125/65   Pulse 79   Temp 98 F (36.7 C) (Oral)   Resp (!) 22 Comment: due to pain  Ht 5' 7 (1.702 m)   Wt 113.4 kg   SpO2 99%   BMI 39.16 kg/m   Physical Exam  Procedures  Procedures  ED Course / MDM    Medical Decision Making Amount and/or Complexity of Data Reviewed Radiology: ordered.  Risk Prescription drug management.  Care handed off by J.Templeton, PA-C.  S/p fall w/ c/o back pain, tenderness to the left iliac crest, ambulatory w/o asistance.  Compression fx at T4 on imaging.  If CT negative, plan d/c w/ OP pain management.  Pending pelvis CT.  Review of pelvic CT imaging did not show any acute abnormality.  As such, plan at this time is to discharge patient with outpatient prescription for pain management, follow-up as noted with previous provider.       [name], [place] 08/23/23 2012    Jocelyn Ozell BROCKS, MD 08/24/23 1100

## 2023-08-24 MED FILL — Oxycodone w/ Acetaminophen Tab 5-325 MG: ORAL | Qty: 6 | Status: AC

## 2023-08-28 ENCOUNTER — Ambulatory Visit (HOSPITAL_COMMUNITY)
Admission: RE | Admit: 2023-08-28 | Discharge: 2023-08-28 | Disposition: A | Discharge status: Home / Self Care | Source: Ambulatory Visit | Attending: Family Medicine | Admitting: Family Medicine

## 2023-08-28 ENCOUNTER — Encounter: Payer: Self-pay | Admitting: Nurse Practitioner

## 2023-08-28 ENCOUNTER — Ambulatory Visit: Admitting: Nurse Practitioner

## 2023-08-28 VITALS — BP 130/70 | HR 85 | Temp 97.2°F | Ht 67.0 in | Wt 255.0 lb

## 2023-08-28 DIAGNOSIS — Z1231 Encounter for screening mammogram for malignant neoplasm of breast: Secondary | ICD-10-CM | POA: Diagnosis not present

## 2023-08-28 DIAGNOSIS — M5442 Lumbago with sciatica, left side: Secondary | ICD-10-CM | POA: Diagnosis not present

## 2023-08-28 DIAGNOSIS — W182XXD Fall in (into) shower or empty bathtub, subsequent encounter: Secondary | ICD-10-CM

## 2023-08-28 DIAGNOSIS — M25511 Pain in right shoulder: Secondary | ICD-10-CM | POA: Diagnosis not present

## 2023-08-28 NOTE — Patient Instructions (Addendum)
 Get labs done next week. Orders are in the system Because of your last kidney test, avoid all NSAIDs including Aleve , Ibuprofen , Goody Powders and Meloxicam  (your prescribed medication) until we get your new labs.  Will order lidocaine  patches. If not covered by insurance, you can buy them over the counter. Try heat applications and stretching.

## 2023-08-28 NOTE — Progress Notes (Signed)
 Subjective:    Patient ID: [name], female    DOB: [birth year], 73 y.o.   MRN: [id number]  HPI  Discussed the use of AI scribe software for clinical note transcription with the patient, who gave verbal consent to proceed.  History of Present Illness [name] is a 73 year old female who presents with pain following a fall.  She experienced a fall on Sunday morning, resulting in pain primarily in the left lower back, upper thigh, and right shoulder. The pain was most severe on Monday and Tuesday. She has been using Percocet since Sunday for pain management, which she finds effective.  A comprehensive workup in the emergency room, including x-rays and a CT scan, was performed. She has a history of knee and hip replacements, which impacts her mobility, particularly in getting in and out of the tub.  She has a history of ulcerative colitis and is currently taking meloxicam  15 mg once daily for arthritis pain. She also took four Aleve  following the fall. Her kidney function has fluctuated in the past, with recent labs showing a decrease.  She has experienced multiple falls in the past, both in the tub and at her apartment, but has not sustained any serious injuries. She has a shower chair to assist with bathing. No unusual shortness of breath, chest pain, tightness, coughing, or wheezing.    Review of Systems  Constitutional:  Positive for fatigue.  Respiratory:  Negative for cough, chest tightness and shortness of breath.        No unusual shortness of breath. Generalized fatigue at times.   Cardiovascular:  Negative for chest pain.  Musculoskeletal:  Positive for back pain.       Right shoulder pain.      08/28/2023    9:21 AM  Depression screen PHQ 2/9  Decreased Interest 0  Down, Depressed, Hopeless 0  PHQ - 2 Score 0  Altered sleeping 0  Tired, decreased energy 0  Change in appetite 0  Feeling bad or failure about yourself  0  Trouble concentrating 0  Moving  slowly or fidgety/restless 0  Suicidal thoughts 0  PHQ-9 Score 0  Difficult doing work/chores Not difficult at all      08/28/2023    9:21 AM 03/12/2023    3:10 PM 10/08/2020   10:35 AM  GAD 7 : Generalized Anxiety Score  Nervous, Anxious, on Edge 0 0 0  Control/stop worrying 0 0 0  Worry too much - different things 0 0 0  Trouble relaxing 0 0 0  Restless 0 0 0  Easily annoyed or irritable 0 0 0  Afraid - awful might happen 0 0 0  Total GAD 7 Score 0 0 0  Anxiety Difficulty Not difficult at all Not difficult at all     Social History   Tobacco Use   Smoking status: Never   Smokeless tobacco: Never  Vaping Use   Vaping status: Never Used  Substance Use Topics   Alcohol use: No   Drug use: Never        Objective:   Physical Exam Vitals and nursing note reviewed.  Constitutional:      General: She is not in acute distress. Cardiovascular:     Rate and Rhythm: Normal rate and regular rhythm.  Pulmonary:     Effort: Pulmonary effort is normal.     Breath sounds: Normal breath sounds.  Musculoskeletal:     Comments: Significant tenderness with palpation of the  left lower back. No bruising noted. Radiates into the flank area into the upper thigh. Tenderness with palpation of the posterior right shoulder. Can perform full active ROM of the right shoulder without difficulty.   Neurological:     Mental Status: She is alert and oriented to person, place, and time.     Comments: Gait slow but steady.   Psychiatric:        Mood and Affect: Mood normal.        Behavior: Behavior normal.        Thought Content: Thought content normal.        Judgment: Judgment normal.    Today's Vitals   08/28/23 0920  BP: 130/70  Pulse: 85  Temp: (!) 97.2 F (36.2 C)  SpO2: 97%  Weight: 255 lb (115.7 kg)  Height: 5' 7 (1.702 m)   Body mass index is 39.94 kg/m.  See imaging studies 08/23/23.        Assessment & Plan:  Assessment and Plan Assessment & Plan Problem List  Items Addressed This Visit       Nervous and Auditory   Low back pain with left-sided sciatica     Other   Fall in (into) shower or empty bathtub, subsequent encounter - Primary   Relevant Orders   AMB Referral [facility] Care Management   Other Visit Diagnoses       Acute pain of right shoulder             Imaging showed no fractures or tears, mild degenerative changes. Pain likely from muscle strain and inflammation. Shoulder range of motion intact, no rotator cuff tear. Pain managed with Percocet. - Continue Percocet temporarily for pain management. - Use heating pad or ice pack for pain relief. - Encourage stretching exercises for muscle tightness. - Prescribe lidocaine  patches if insurance approved.  Meds ordered this encounter  Medications   lidocaine  (LIDODERM ) 5 %    Sig: Apply to the affected area on the back for up to 12 hours each day then remove    Dispense:  14 patch    Refill:  0    Supervising Provider:   ALPHONSA HAMILTON A [[id number]]    Has labs ordered by Dr. HAMILTON including repeat kidney function. Patient elects to get them done closer to her next appointment.  Because of your last kidney test, avoid all NSAIDs including Aleve , Ibuprofen , Goody Powders and Meloxicam  (your prescribed medication) until we get your new labs.  Will order lidocaine  patches. If not covered by insurance, you can buy them over the counter. Return for follow up with Dr. HAMILTON on 10/29 as planned.

## 2023-08-29 ENCOUNTER — Encounter: Payer: Self-pay | Admitting: Nurse Practitioner

## 2023-08-29 MED ORDER — LIDOCAINE 5 % EX PTCH: MEDICATED_PATCH | CUTANEOUS | 0 refills | Status: AC

## 2023-08-31 ENCOUNTER — Telehealth: Payer: Self-pay

## 2023-08-31 NOTE — Progress Notes (Signed)
 Complex Care Management Note  Care Guide Note 08/31/2023 Name: [name] MRN: [id number] DOB: 01-27-1950  [name] is a 73 y.o. year old female who sees Luking, Glendia LABOR, MD for primary care. I reached out to [name] by phone today to offer complex care management services.  [name] was given information about Complex Care Management services today including:   The Complex Care Management services include support from the care team which includes your Nurse Care Manager, Clinical Social Worker, or Pharmacist.  The Complex Care Management team is here to help remove barriers to the health concerns and goals most important to you. Complex Care Management services are voluntary, and the patient may decline or stop services at any time by request to their care team member.   Complex Care Management Consent Status: Patient agreed to services and verbal consent obtained.   Follow up plan:  Telephone appointment with complex care management team member scheduled for:  09/02/2023  Encounter Outcome:  Patient Scheduled  Jeoffrey Buffalo , RMA     [place]  [facility] Guide  Direct Dial: [phone]  Website: [email or web address]

## 2023-09-02 ENCOUNTER — Telehealth: Payer: Self-pay

## 2023-09-02 ENCOUNTER — Other Ambulatory Visit: Payer: Self-pay | Admitting: *Deleted

## 2023-09-02 NOTE — Patient Outreach (Addendum)
 Complex Care Management   Visit Note  09/02/2023  Name:  [name] MRN: [id number] DOB: 12-05-1950  Situation: Referral received for Complex Care Management related to SDOH Barriers:  Housing Upgrades - multiple falls I obtained verbal consent from Patient.  Visit completed with Patient  on the phone  Background:   Past Medical History:  Diagnosis Date   Arthritis    Essential hypertension    Finger amputation, traumatic Age 73   Axe   Hyperlipidemia    Obesity    PONV (postoperative nausea and vomiting)    Type 2 diabetes mellitus (HCC)    Type 2 diabetes mellitus with diabetic neuropathy, unspecified (HCC) 02/04/2018    Assessment: Patient Reported Symptoms:  Cognitive Cognitive Status: No symptoms reported Cognitive/Intellectual Conditions Management [RPT]: None reported or documented in medical history or problem list   Health Maintenance Behaviors: Annual physical exam Healing Pattern: Average Health Facilitated by: Prayer/meditation, Rest, Pain control  Neurological Neurological Review of Symptoms: No symptoms reported    HEENT HEENT Symptoms Reported: No symptoms reported      Cardiovascular Cardiovascular Symptoms Reported: No symptoms reported Does patient have uncontrolled Hypertension?: No    Respiratory Respiratory Symptoms Reported: No symptoms reported    Endocrine Endocrine Symptoms Reported: No symptoms reported Is patient diabetic?: Yes Is patient checking blood sugars at home?: No    Gastrointestinal Gastrointestinal Symptoms Reported: No symptoms reported      Genitourinary Genitourinary Symptoms Reported: No symptoms reported    Integumentary Integumentary Symptoms Reported: No symptoms reported    Musculoskeletal Musculoskelatal Symptoms Reviewed: No symptoms reported   Falls in the past year?: Yes Number of falls in past year: 2 or more Was there an injury with Fall?: Yes Fall Risk Category Calculator: 3 Patient Fall Risk Level: High  Fall Risk Patient at Risk for Falls Due to: History of fall(s), Impaired balance/gait Fall risk Follow up: Falls evaluation completed, Education provided  Psychosocial Psychosocial Symptoms Reported: No symptoms reported   Major Change/Loss/Stressor/Fears (CP): Denies Techniques to Cope with Loss/Stress/Change: Not applicable Quality of Family Relationships: helpful, involved, supportive Do you feel physically threatened by others?: No    09/02/2023    PHQ2-9 Depression Screening   Little interest or pleasure in doing things Not at all  Feeling down, depressed, or hopeless Not at all  PHQ-2 - Total Score 0  Trouble falling or staying asleep, or sleeping too much    Feeling tired or having little energy    Poor appetite or overeating     Feeling bad about yourself - or that you are a failure or have let yourself or your family down    Trouble concentrating on things, such as reading the newspaper or watching television    Moving or speaking so slowly that other people could have noticed.  Or the opposite - being so fidgety or restless that you have been moving around a lot more than usual    Thoughts that you would be better off dead, or hurting yourself in some way    PHQ2-9 Total Score    If you checked off any problems, how difficult have these problems made it for you to do your work, take care of things at home, or get along with other people    Depression Interventions/Treatment      There were no vitals filed for this visit.  Medications Reviewed Today     Reviewed by Bertrum Rosina CHRISTELLA, RN (Registered Nurse) on 09/02/23 at [phone]  Med List Status: <None>   Medication Order Taking? Sig Documenting Provider Last Dose Status Informant  Accu-Chek Softclix Lancets lancets [id number]  TEST BLOOD SUGAR TWO TIMES DAILY FOR DIABETES  Patient not taking: Reported on 09/02/2023   Alphonsa Glendia LABOR, MD  Consider Medication Status and Discontinue   Blood Glucose Monitoring Suppl (ACCU-CHEK  GUIDE ME) w/Device KIT [id number]    Patient not taking: Reported on 09/02/2023   [provider]  Consider Medication Status and Discontinue   glucose blood (ACCU-CHEK GUIDE) test strip [id number]  TEST BLOOD SUGAR TWO TIMES DAILY FOR DIABETES  Patient not taking: Reported on 09/02/2023   Alphonsa Glendia LABOR, MD  Consider Medication Status and Discontinue   hydrocortisone  2.5 % cream [id number] Yes Apply thin amount twice daily to the scalp as needed for itching Alphonsa Glendia LABOR, MD  Active   lidocaine  (LIDODERM ) 5 % [id number]  Apply to the affected area on the back for up to 12 hours each day then remove Mauro Elveria BROCKS, NP  Active   meloxicam  (MOBIC ) 15 MG tablet [id number] Yes Take 1 tablet (15 mg total) by mouth daily. Margrette Taft BRAVO, MD  Active   metFORMIN  (GLUCOPHAGE ) 500 MG tablet [id number] Yes Take 1/2 tablet po BID Alphonsa Glendia LABOR, MD  Active   oxyCODONE -acetaminophen  (PERCOCET/ROXICET) 5-325 MG tablet [id number] Yes Take 1 tablet by mouth every 6 (six) hours as needed for severe pain (pain score 7-10). Myriam Dorn BROCKS, PA  Active   oxyCODONE -acetaminophen  (PERCOCET/ROXICET) 5-325 MG tablet [id number]  Take 1 tablet by mouth every 6 (six) hours as needed for severe pain (pain score 7-10).  Patient not taking: Reported on 09/02/2023   Myriam Dorn BROCKS, PA  Consider Medication Status and Discontinue   rosuvastatin  (CRESTOR ) 40 MG tablet [id number] Yes Take 1 tablet (40 mg total) by mouth daily. Alphonsa Glendia LABOR, MD  Active   tirzepatide  (MOUNJARO ) 12.5 MG/0.5ML Pen [id number] Yes Inject 12.5 mg into the skin once a week. Alphonsa Glendia LABOR, MD  Active             Recommendation:   Continue Current Plan of Care  Follow Up Plan:   Closing From:  Complex Care Management. Patient declined enrollment in Complex Care Management Services at this time, expressing a preference for receiving resources related to bathroom modifications due to experiencing multiple falls. Per Care Guide  documentation, a follow-up with the patient is scheduled within 72 hours.  Rosina Forte, BSN RN [facility], [facility] RN Care Manager Direct Dial: [phone]  Fax: [phone]

## 2023-09-02 NOTE — Progress Notes (Signed)
   Telephone encounter was:  Successful.  Complex Care Management Note Care Guide Note  09/02/2023 Name: [name] MRN: [id number] DOB: 1950-02-18  [name] is a 73 y.o. year old female who is a primary care patient of Luking, Glendia LABOR, MD . The community resource team was consulted for assistance with Home Modifications  SDOH screenings and interventions completed:  Yes        Care guide performed the following interventions: Patient provided with information about care guide support team and interviewed to confirm resource needs.Pt has fallen in the shower and is needing a walk in shower with grab bars or someone to assist her in the shower. pt has broken her shower chair during the fall. Pt needs resources for [facility]   Follow Up Plan:  Care guide will follow up with patient by phone over the next 72 hours  Encounter Outcome:  Patient Visit Completed    Jon Colt [facility]  [facility] Guide, Phone: [phone] Fax: [phone] Website: [email or web address]

## 2023-09-02 NOTE — Patient Instructions (Addendum)
 Visit Information  Thank you for taking time to visit with me today. Please don't hesitate to contact me if I can be of assistance to you before our next scheduled appointment.  Our next appointment is no further scheduled appointments.  Patient declined enrollment in Complex Care Management Services at this time, expressing a preference for receiving resources related to bathroom modifications due to experiencing multiple falls. Per Care Guide documentation, a follow-up with the patient is scheduled within 72 hours. Please call the care guide team at [phone] if you need to cancel or reschedule your appointment.   Following is a copy of your care plan:   Goals Addressed   None     Please call the Suicide and Crisis Lifeline: 988 call the [place]  National Suicide Prevention Lifeline: (365)281-0338 or TTY: 7194070076 TTY 260-522-9279) to talk to a trained counselor call 1-800-273-TALK (toll free, 24 hour hotline) call the [facility]: [phone] call 911 if you are experiencing a Mental Health or Behavioral Health Crisis or need someone to talk to.  The patient verbalized understanding of instructions, educational materials, and care plan provided today and agreed to receive a mailed copy of patient instructions, educational materials, and care plan.   Rosina Forte, BSN RN [facility], [facility] RN Care Manager Direct Dial: [phone]  Fax: [phone]

## 2023-09-10 ENCOUNTER — Telehealth: Payer: Self-pay

## 2023-09-10 NOTE — Progress Notes (Signed)
      Mailing home repair batheroom safety resources to patient

## 2023-10-13 DIAGNOSIS — E119 Type 2 diabetes mellitus without complications: Secondary | ICD-10-CM | POA: Diagnosis not present

## 2023-10-23 ENCOUNTER — Other Ambulatory Visit: Payer: Self-pay | Admitting: Family Medicine

## 2023-10-30 ENCOUNTER — Ambulatory Visit

## 2023-10-30 VITALS — Ht 67.0 in | Wt 255.0 lb

## 2023-10-30 DIAGNOSIS — E119 Type 2 diabetes mellitus without complications: Secondary | ICD-10-CM

## 2023-10-30 DIAGNOSIS — E1140 Type 2 diabetes mellitus with diabetic neuropathy, unspecified: Secondary | ICD-10-CM

## 2023-10-30 DIAGNOSIS — Z0000 Encounter for general adult medical examination without abnormal findings: Secondary | ICD-10-CM

## 2023-10-30 DIAGNOSIS — Z780 Asymptomatic menopausal state: Secondary | ICD-10-CM | POA: Diagnosis not present

## 2023-10-30 MED ORDER — LANCET DEVICE MISC: 1.0000 | Freq: Three times a day (TID) | 0 refills | Status: AC

## 2023-10-30 MED ORDER — LANCETS MISC: 1.0000 | 0 refills | Status: AC

## 2023-10-30 MED ORDER — BLOOD GLUCOSE MONITORING SUPPL DEVI: 1.0000 | Freq: Three times a day (TID) | 0 refills | Status: AC

## 2023-10-30 MED ORDER — BLOOD GLUCOSE TEST VI STRP: 1.0000 | ORAL_STRIP | Freq: Three times a day (TID) | 0 refills | Status: AC

## 2023-10-30 NOTE — Progress Notes (Signed)
 Subjective:   [name] is a 73 y.o. who presents for a Medicare Wellness preventive visit.  As a reminder, Annual Wellness Visits don't include a physical exam, and some assessments may be limited, especially if this visit is performed virtually. We may recommend an in-person follow-up visit with your provider if needed.  Visit Complete: Virtual I connected with  Daniela CHRISTELLA Moats on 10/30/23 by a audio enabled telemedicine application and verified that I am speaking with the correct person using two identifiers.  Patient Location: Home  Provider Location: Home Office  I discussed the limitations of evaluation and management by telemedicine. The patient expressed understanding and agreed to proceed.  Vital Signs: Because this visit was a virtual/telehealth visit, some criteria may be missing or patient reported. Any vitals not documented were not able to be obtained and vitals that have been documented are patient reported.  VideoDeclined- This patient declined Librarian, academic. Therefore the visit was completed with audio only.  Persons Participating in Visit: Patient.  AWV Questionnaire: No: Patient Medicare AWV questionnaire was not completed prior to this visit.  Cardiac Risk Factors include: advanced age (>83men, >70 women);diabetes mellitus;dyslipidemia;hypertension;obesity (BMI >30kg/m2);sedentary lifestyle     Objective:    Today's Vitals   10/30/23 0906  Weight: 255 lb (115.7 kg)  Height: 5' 7 (1.702 m)   Body mass index is 39.94 kg/m.     10/30/2023    8:54 AM 09/02/2023   11:50 AM 08/23/2023    4:11 PM 04/05/2023    2:58 PM 03/05/2023    9:22 AM 03/05/2021    3:57 PM 12/24/2020    6:54 AM  Advanced Directives  Does Patient Have a Medical Advance Directive? No No No No No No No  Would patient like information on creating a medical advance directive? No - Patient declined Yes (MAU/Ambulatory/Procedural Areas - Information given)    No - Patient declined No - Patient declined No - Patient declined    Current Medications (verified) Outpatient Encounter Medications as of 10/30/2023  Medication Sig   hydrocortisone  2.5 % cream Apply thin amount twice daily to the scalp as needed for itching   lidocaine  (LIDODERM ) 5 % Apply to the affected area on the back for up to 12 hours each day then remove   meloxicam  (MOBIC ) 15 MG tablet Take 1 tablet (15 mg total) by mouth daily.   metFORMIN  (GLUCOPHAGE ) 500 MG tablet Take 1/2 tablet po BID   MOUNJARO  12.5 MG/0.5ML Pen INJECT 1/2 (ONE-HALF) ML SUBCUTANEOUSLY  ONCE A WEEK   oxyCODONE -acetaminophen  (PERCOCET/ROXICET) 5-325 MG tablet Take 1 tablet by mouth every 6 (six) hours as needed for severe pain (pain score 7-10).   rosuvastatin  (CRESTOR ) 40 MG tablet Take 1 tablet (40 mg total) by mouth daily.   Accu-Chek Softclix Lancets lancets TEST BLOOD SUGAR TWO TIMES DAILY FOR DIABETES (Patient not taking: Reported on 10/30/2023)   oxyCODONE -acetaminophen  (PERCOCET/ROXICET) 5-325 MG tablet Take 1 tablet by mouth every 6 (six) hours as needed for severe pain (pain score 7-10). (Patient not taking: Reported on 10/30/2023)   [DISCONTINUED] Blood Glucose Monitoring Suppl (ACCU-CHEK GUIDE ME) w/Device KIT  (Patient not taking: Reported on 10/30/2023)   [DISCONTINUED] glucose blood (ACCU-CHEK GUIDE) test strip TEST BLOOD SUGAR TWO TIMES DAILY FOR DIABETES (Patient not taking: Reported on 10/30/2023)   No facility-administered encounter medications on file as of 10/30/2023.    Allergies (verified) Penicillins, Tramadol , and Hydrocodone    History: Past Medical History:  Diagnosis Date  Arthritis    Essential hypertension    Finger amputation, traumatic Age 49   Axe   Hyperlipidemia    Obesity    PONV (postoperative nausea and vomiting)    Type 2 diabetes mellitus (HCC)    Type 2 diabetes mellitus with diabetic neuropathy, unspecified (HCC) 02/04/2018   Past Surgical History:   Procedure Laterality Date   ABDOMINAL HYSTERECTOMY     CATARACT EXTRACTION W/PHACO Right 02/25/2021   Procedure: CATARACT EXTRACTION PHACO AND INTRAOCULAR LENS PLACEMENT (IOC);  Surgeon: Harrie Agent, MD;  Location: [facility];  Service: Ophthalmology;  Laterality: Right;  10.45   CATARACT EXTRACTION W/PHACO Left 03/11/2021   Procedure: CATARACT EXTRACTION PHACO AND INTRAOCULAR LENS PLACEMENT (IOC);  Surgeon: Harrie Agent, MD;  Location: [facility];  Service: Ophthalmology;  Laterality: Left;  CDE: 8.82   COLONOSCOPY  12/2008   Dr. Harvey: slightly tortuous colon. internal hemorrhoids. next colonoscopy in 10 years.    COLONOSCOPY WITH PROPOFOL  N/A 12/24/2020   Procedure: COLONOSCOPY WITH PROPOFOL ;  Surgeon: Cindie Carlin POUR, DO;  Location: [facility];  Service: Endoscopy;  Laterality: N/A;  8:00am   HERNIA REPAIR     x2   OOPHORECTOMY     POLYPECTOMY  12/24/2020   Procedure: POLYPECTOMY;  Surgeon: Cindie Carlin POUR, DO;  [facility];  Service: Endoscopy;;   REPLACEMENT TOTAL KNEE Right    TOTAL HIP ARTHROPLASTY Left 2008?   [facility], [place]   TOTAL KNEE ARTHROPLASTY  09/09/2010   Procedure: TOTAL KNEE ARTHROPLASTY;  Surgeon: Taft Minerva, MD;  Location: [facility];  Service: Orthopedics;  Laterality: Left;  With DePuy   Family History  Problem Relation Age of Onset   Alcohol abuse Father    Heart attack Mother    Other Brother        gunshot   Alcohol abuse Brother    Heart attack Sister    Breast cancer Sister    Arthritis Other    Colon cancer Neg Hx    Social History   Socioeconomic History   Marital status: Legally Separated    Spouse name: Not on file   Number of children: 4   Years of education: 9   Highest education level: Not on file  Occupational History   Occupation: retired  Tobacco Use   Smoking status: Never   Smokeless tobacco: Never  Vaping Use   Vaping status: Never Used  Substance and Sexual Activity   Alcohol use: No   Drug use: Never    Sexual activity: Not Currently    Birth control/protection: Surgical    Comment: hyst  Other Topics Concern   Not on file  Social History Narrative   Right handed   Drinks caffeine coffee    An apartment down stairs   Social Drivers of Health   Financial Resource Strain: Low Risk  (10/30/2023)   Overall Financial Resource Strain (CARDIA)    Difficulty of Paying Living Expenses: Not hard at all  Food Insecurity: No Food Insecurity (10/30/2023)   Hunger Vital Sign    Worried About Running Out of Food in the Last Year: Never true    Ran Out of Food in the Last Year: Never true  Transportation Needs: No Transportation Needs (10/30/2023)   PRAPARE - Administrator, Civil Service (Medical): No    Lack of Transportation (Non-Medical): No  Physical Activity: Patient Declined (10/30/2023)   Exercise Vital Sign    Days of Exercise per Week: Patient declined  Minutes of Exercise per Session: Patient declined  Stress: No Stress Concern Present (10/30/2023)   [employer] of Occupational Health - Occupational Stress Questionnaire    Feeling of Stress: Not at all  Social Connections: Moderately Isolated (10/30/2023)   Social Connection and Isolation Panel    Frequency of Communication with Friends and Family: Three times a week    Frequency of Social Gatherings with Friends and Family: Twice a week    Attends Religious Services: More than 4 times per year    Active Member of [employer] or Organizations: No    Attends Banker Meetings: Never    Marital Status: Separated    Tobacco Counseling Counseling given: Yes    Clinical Intake:  Pre-visit preparation completed: Yes  Pain : No/denies pain     BMI - recorded: 39.94 Nutritional Status: BMI > 30  Obese Nutritional Risks: None Diabetes: Yes CBG done?: No (telehealth visit. unable to obtain bp) Did pt. bring in CBG monitor from home?: No  Lab Results  Component Value Date   HGBA1C 6.2 (H)  05/06/2023   HGBA1C 6.4 (H) 10/10/2022   HGBA1C 6.5 (H) 12/03/2021     How often do you need to have someone help you when you read instructions, pamphlets, or other written materials from your doctor or pharmacy?: 1 - Never  Interpreter Needed?: No  Information entered by :: Karell Tukes W CMA (AAMA)   Activities of Daily Living     10/30/2023    9:53 AM  In your present state of health, do you have any difficulty performing the following activities:  Hearing? 0  Vision? 0  Difficulty concentrating or making decisions? 0  Walking or climbing stairs? 0  Dressing or bathing? 0  Doing errands, shopping? 0  Preparing Food and eating ? N  Using the Toilet? N  In the past six months, have you accidently leaked urine? N  Do you have problems with loss of bowel control? N  Managing your Medications? N  Managing your Finances? N  Housekeeping or managing your Housekeeping? N    Patient Care Team: Alphonsa Glendia LABOR, MD as PCP - General (Family Medicine) Skeet Juliene SAUNDERS, DO as Consulting Physician (Neurology) Cindie Carlin POUR, DO as Consulting Physician (Internal Medicine) Jacinto Lonni PARAS, [facility] (Inactive) (Pharmacist) Debera Jayson MATSU, MD as Consulting Physician (Cardiology) Margrette Taft BRAVO, MD as Consulting Physician (Orthopedic Surgery)  I have updated your Care Teams any recent Medical Services you may have received from other providers in the past year.     Assessment:   This is a routine wellness examination for Altha.  Hearing/Vision screen Hearing Screening - Comments:: Patient denies any hearing difficulties.   Vision Screening - Comments:: Wears rx glasses - up to date with routine eye exams with     Goals Addressed               This Visit's Progress     I would like to go back home ([place]) (pt-stated)          Depression Screen     10/30/2023    9:49 AM 09/02/2023   11:59 AM 08/28/2023    9:21 AM 05/06/2023    8:32 AM 03/12/2023    3:10 PM  04/01/2022    9:08 AM 12/03/2021    9:16 AM  PHQ 2/9 Scores  PHQ - 2 Score 0 0 0 0 0 0 0  PHQ- 9 Score 0  0 0 0  0  Fall Risk     10/30/2023    9:27 AM 09/02/2023   11:55 AM 08/28/2023    9:21 AM 05/06/2023    8:32 AM 03/12/2023    3:10 PM  Fall Risk   Falls in the past year? 1 1 1  0 0  Number falls in past yr: 0 1 0    Injury with Fall? 0 1 1    Risk for fall due to : Impaired balance/gait;Impaired mobility;History of fall(s);Orthopedic patient History of fall(s);Impaired balance/gait History of fall(s)    Follow up Falls prevention discussed;Education provided;Falls evaluation completed Falls evaluation completed;Education provided Falls evaluation completed      MEDICARE RISK AT HOME:  Medicare Risk at Home Any stairs in or around the home?: Yes If so, are there any without handrails?: No Home free of loose throw rugs in walkways, pet beds, electrical cords, etc?: Yes Adequate lighting in your home to reduce risk of falls?: Yes Life alert?: No Use of a cane, walker or w/c?: Yes Grab bars in the bathroom?: No (patient is having a walk in shower installed due to multiple falls.) Shower chair or bench in shower?: Yes Elevated toilet seat or a handicapped toilet?: No  TIMED UP AND GO:  Was the test performed?  No  Cognitive Function: 6CIT completed        10/30/2023    9:50 AM  6CIT Screen  What Year? 0 points  What month? 0 points  What time? 0 points  Count back from 20 0 points  Months in reverse 0 points  Repeat phrase 0 points  Total Score 0 points    Immunizations Immunization History  Administered Date(s) Administered   Fluad Quad(high Dose 65+) 10/24/2019, 10/31/2020, 09/23/2021   Fluad Trivalent(High Dose 65+) 09/24/2022   Influenza Split 10/04/2012, 09/13/2013   Influenza,inj,Quad PF,6+ Mos 09/22/2014, 10/05/2015, 11/04/2017   Influenza-Unspecified 09/14/2018   Moderna Sars-Covid-2 Vaccination 03/15/2019, 04/12/2019   Pneumococcal Conjugate-13  04/24/2016   Pneumococcal Polysaccharide-23 06/14/2013, 10/24/2019   Td 06/14/2013   Zoster Recombinant(Shingrix) 02/03/2018    Screening Tests Health Maintenance  Topic Date Due   DEXA SCAN  10/09/2017   Zoster Vaccines- Shingrix (2 of 2) 03/31/2018   COVID-19 Vaccine (3 - Moderna risk series) 05/10/2019   OPHTHALMOLOGY EXAM  06/04/2023   DTaP/Tdap/Td (2 - Tdap) 06/15/2023   Influenza Vaccine  04/12/2024 (Originally 08/14/2023)   HEMOGLOBIN A1C  11/05/2023   Diabetic kidney evaluation - eGFR measurement  05/05/2024   Diabetic kidney evaluation - Urine ACR  05/05/2024   FOOT EXAM  05/05/2024   Mammogram  08/27/2024   Medicare Annual Wellness (AWV)  10/29/2024   Colonoscopy  12/25/2030   Pneumococcal Vaccine: 50+ Years  Completed   Hepatitis C Screening  Completed   Meningococcal B Vaccine  Aged Out    Health Maintenance Health Maintenance Due  Topic Date Due   DEXA SCAN  10/09/2017   Zoster Vaccines- Shingrix (2 of 2) 03/31/2018   COVID-19 Vaccine (3 - Moderna risk series) 05/10/2019   OPHTHALMOLOGY EXAM  06/04/2023   DTaP/Tdap/Td (2 - Tdap) 06/15/2023   Health Maintenance Items Addressed: DEXA ordered  Additional Screening:  Vision Screening: Recommended annual ophthalmology exams for early detection of glaucoma and other disorders of the eye. Would you like a referral to an eye doctor? No    Dental Screening: Recommended annual dental exams for proper oral hygiene  Community Resource Referral / Chronic Care Management: CRR required this visit?  No   CCM required  this visit?  No   Plan:    I have personally reviewed and noted the following in the patient's chart:   Medical and social history Use of alcohol, tobacco or illicit drugs  Current medications and supplements including opioid prescriptions. Patient is currently taking opioid prescriptions. Information provided to patient regarding non-opioid alternatives. Patient advised to discuss non-opioid  treatment plan with their provider. Functional ability and status Nutritional status Physical activity Advanced directives List of other physicians Hospitalizations, surgeries, and ER visits in previous 12 months Vitals Screenings to include cognitive, depression, and falls Referrals and appointments  In addition, I have reviewed and discussed with patient certain preventive protocols, quality metrics, and best practice recommendations. A written personalized care plan for preventive services as well as general preventive health recommendations were provided to patient.   Govani Radloff, CMA   10/30/2023   After Visit Summary: (Mail) Due to this being a telephonic visit, the after visit summary with patients personalized plan was offered to patient via mail   Notes: Nothing significant to report at this time.

## 2023-10-30 NOTE — Patient Instructions (Addendum)
 [name],  Thank you for taking the time for your Medicare Wellness Visit. I appreciate your continued commitment to your health goals. Please review the care plan we discussed, and feel free to reach out if I can assist you further.  Medicare recommends these wellness visits once per year to help you and your care team stay ahead of potential health issues. These visits are designed to focus on prevention, allowing your provider to concentrate on managing your acute and chronic conditions during your regular appointments.  Please note that Annual Wellness Visits do not include a physical exam. Some assessments may be limited, especially if the visit was conducted virtually. If needed, we may recommend a separate in-person follow-up with your provider.  Ongoing Care  Seeing your primary care provider every 3 to 6 months helps us  monitor your health and provide consistent, personalized care.   Referrals   Osteoporosis Screening An order was placed for you to have your Osteoporosis Screening. Call the number below to schedule that [facility]  [phone]   I sent in a prescription for a new glucose meter for you. Pick that up at your preferred pharmacy  Recommended Screenings:  Health Maintenance  Topic Date Due   DEXA scan (bone density measurement)  10/09/2017   Zoster (Shingles) Vaccine (2 of 2) 03/31/2018   COVID-19 Vaccine (3 - Moderna risk series) 05/10/2019   Eye exam for diabetics  06/04/2023   DTaP/Tdap/Td vaccine (2 - Tdap) 06/15/2023   Flu Shot  04/12/2024*   Hemoglobin A1C  11/05/2023   Yearly kidney function blood test for diabetes  05/05/2024   Yearly kidney health urinalysis for diabetes  05/05/2024   Complete foot exam   05/05/2024   Breast Cancer Screening  08/27/2024   Medicare Annual Wellness Visit  10/29/2024   Colon Cancer Screening  12/25/2030   Pneumococcal Vaccine for age over 39  Completed   Hepatitis C Screening  Completed   Meningitis B Vaccine   Aged Out  *Topic was postponed. The date shown is not the original due date.       10/30/2023    8:54 AM  Advanced Directives  Does Patient Have a Medical Advance Directive? No  Would patient like information on creating a medical advance directive? No - Patient declined    Advance Care Planning is important because it: Ensures you receive medical care that aligns with your values, goals, and preferences. Provides guidance to your family and loved ones, reducing the emotional burden of decision-making during critical moments.  Vision: Annual vision screenings are recommended for early detection of glaucoma, cataracts, and diabetic retinopathy. These exams can also reveal signs of chronic conditions such as diabetes and high blood pressure.  Dental: Annual dental screenings help detect early signs of oral cancer, gum disease, and other conditions linked to overall health, including heart disease and diabetes.  Please see the attached documents for additional preventive care recommendations.

## 2023-11-05 ENCOUNTER — Ambulatory Visit (HOSPITAL_COMMUNITY)
Admission: RE | Admit: 2023-11-05 | Discharge: 2023-11-05 | Disposition: A | Discharge status: Home / Self Care | Source: Ambulatory Visit | Attending: Family Medicine | Admitting: Family Medicine

## 2023-11-05 ENCOUNTER — Ambulatory Visit: Payer: Self-pay | Admitting: Family Medicine

## 2023-11-05 DIAGNOSIS — Z780 Asymptomatic menopausal state: Secondary | ICD-10-CM | POA: Diagnosis not present

## 2023-11-11 ENCOUNTER — Encounter: Payer: Self-pay | Admitting: Family Medicine

## 2023-11-11 ENCOUNTER — Ambulatory Visit: Admitting: Family Medicine

## 2023-11-11 DIAGNOSIS — E785 Hyperlipidemia, unspecified: Secondary | ICD-10-CM

## 2023-11-11 DIAGNOSIS — Z7985 Long-term (current) use of injectable non-insulin antidiabetic drugs: Secondary | ICD-10-CM

## 2023-11-11 DIAGNOSIS — E119 Type 2 diabetes mellitus without complications: Secondary | ICD-10-CM

## 2023-11-11 DIAGNOSIS — N1831 Chronic kidney disease, stage 3a: Secondary | ICD-10-CM | POA: Diagnosis not present

## 2023-11-11 DIAGNOSIS — Z7984 Long term (current) use of oral hypoglycemic drugs: Secondary | ICD-10-CM | POA: Diagnosis not present

## 2023-11-11 DIAGNOSIS — M25511 Pain in right shoulder: Secondary | ICD-10-CM

## 2023-11-11 DIAGNOSIS — E1169 Type 2 diabetes mellitus with other specified complication: Secondary | ICD-10-CM | POA: Diagnosis not present

## 2023-11-11 MED ORDER — METFORMIN HCL 500 MG PO TABS: ORAL_TABLET | ORAL | 1 refills | Status: DC

## 2023-11-11 MED ORDER — MOUNJARO 12.5 MG/0.5ML ~~LOC~~ SOAJ: 12.5000 mg | SUBCUTANEOUS | 6 refills | Status: AC

## 2023-11-11 MED ORDER — ROSUVASTATIN CALCIUM 40 MG PO TABS: 40.0000 mg | ORAL_TABLET | Freq: Every day | ORAL | 5 refills | Status: AC

## 2023-11-11 NOTE — Progress Notes (Signed)
   Subjective:    Patient ID: [name], female    DOB: 05-15-1950, 73 y.o.   MRN: [id number]  HPI  6 month follow up - medication refills diabetes, cholesterol No concerns Discussed the use of AI scribe software for clinical note transcription with the patient, who gave verbal consent to proceed.  History of Present Illness   [name] is a 73 year old female who presents with sleep disturbances and pain management issues following a fall. She is accompanied by a family member who assists her with driving.  She experiences difficulty sleeping at night, often waking up after a few hours and struggling to return to sleep. She estimates getting about four to five hours of sleep per night, with her bedtime varying, sometimes as early as 8:00 PM. Watching TV occasionally helps her fall back asleep, but she often remains awake if she gets up to use the bathroom. No new swelling in her legs, bowel movements are fine, and urination is normal.  She experiences pain and aching at night, particularly on the side she fell on, which affects her ability to sleep comfortably. She also reports pain in her elbow and back, which she associates with the fall. She is currently out of her pain medication, oxycodone .  She experiences tingling in her foot, particularly when driving or sitting at home, which she attributes to her diabetes. She manages this symptom by massaging her feet. No pain associated with the tingling. She is consistent with her medication regimen, taking her Mounjaro  shot once a week on Tuesdays and metformin  once a day, sometimes splitting the dose into halves. She also takes cholesterol medication regularly.  She plans to travel to [place]  with a family member who assists her with driving.      Review of Systems     Objective:   Physical Exam  General-in no acute distress Eyes-no discharge Lungs-respiratory rate normal, CTA CV-no murmurs,RRR Extremities skin warm  dry no edema Neuro grossly normal Behavior normal, alert       Assessment & Plan:   1. Hyperlipidemia associated with type 2 diabetes mellitus [facility]) Patient states she had her labs drawn await results Continue current medication - rosuvastatin  (CRESTOR ) 40 MG tablet; Take 1 tablet (40 mg total) by mouth daily.  Dispense: 30 tablet; Refill: 5  2. Type 2 diabetes mellitus with hemoglobin A1c goal of less than 7.5% (HCC) (Primary) Previous A1c under good control will check this again by early next year Go ahead with refills of Mounjaro  and metformin  she is doing well with this medicine 3. Acute pain of right shoulder Range of motion exercises discussed hold off on physical therapy  4. Morbid obesity (HCC) Portion control regular physical activity continue current medication  5. Stage 3a chronic kidney disease (HCC) Kidney function under good control continue current meds awaiting lab work that she just recently had

## 2023-11-12 ENCOUNTER — Ambulatory Visit: Payer: Self-pay | Admitting: Family Medicine

## 2023-11-12 ENCOUNTER — Other Ambulatory Visit: Payer: Self-pay

## 2023-11-12 ENCOUNTER — Other Ambulatory Visit: Payer: Self-pay | Admitting: Family Medicine

## 2023-11-12 ENCOUNTER — Telehealth: Payer: Self-pay | Admitting: Family Medicine

## 2023-11-12 DIAGNOSIS — E119 Type 2 diabetes mellitus without complications: Secondary | ICD-10-CM

## 2023-11-12 DIAGNOSIS — N1831 Chronic kidney disease, stage 3a: Secondary | ICD-10-CM

## 2023-11-12 LAB — BASIC METABOLIC PANEL WITH GFR
BUN/Creatinine Ratio: 11 — ABNORMAL LOW (ref 12–28)
BUN: 20 mg/dL (ref 8–27)
CO2: 18 mmol/L — ABNORMAL LOW (ref 20–29)
Calcium: 9.7 mg/dL (ref 8.7–10.3)
Chloride: 103 mmol/L (ref 96–106)
Creatinine, Ser: 1.76 mg/dL — ABNORMAL HIGH (ref 0.57–1.00)
Glucose: 92 mg/dL (ref 70–99)
Potassium: 4.4 mmol/L (ref 3.5–5.2)
Sodium: 138 mmol/L (ref 134–144)
eGFR: 30 mL/min/1.73 — ABNORMAL LOW (ref >59)

## 2023-11-12 LAB — LIPID PANEL
Chol/HDL Ratio: 4.1 ratio (ref 0.0–4.4)
Cholesterol, Total: 180 mg/dL (ref 100–199)
HDL: 44 mg/dL (ref >39)
LDL Chol Calc (NIH): 118 mg/dL — ABNORMAL HIGH (ref 0–99)
Triglycerides: 99 mg/dL (ref 0–149)
VLDL Cholesterol Cal: 18 mg/dL (ref 5–40)

## 2023-11-12 NOTE — Telephone Encounter (Signed)
 Nurses Her lab work came back-see lab work Her serum creatinine is significantly elevated She needs to do the following 1.  Stop metformin  2.  Stop meloxicam  3.  Repeat metabolic 7 along with a A1c in 2 weeks time nonfasting middle of the day when she is well-hydrated  Please also notify [facility] in [place] that we have discontinued metformin  and meloxicam  thank you

## 2023-11-12 NOTE — Telephone Encounter (Signed)
 Pt has been informed per drs result notes and recommendations, pt verbalizes understanding to the doctors medication recommendations and labs.

## 2023-11-16 NOTE — Progress Notes (Signed)
 Unable to leave vm.

## 2023-11-19 ENCOUNTER — Ambulatory Visit: Payer: Self-pay

## 2023-11-19 ENCOUNTER — Other Ambulatory Visit: Payer: Self-pay

## 2023-11-19 DIAGNOSIS — E119 Type 2 diabetes mellitus without complications: Secondary | ICD-10-CM

## 2023-11-19 DIAGNOSIS — R7989 Other specified abnormal findings of blood chemistry: Secondary | ICD-10-CM

## 2023-11-19 NOTE — Telephone Encounter (Signed)
 This RN attempted to reach patient to triage sx. VM not set up. Attempt #3

## 2023-11-19 NOTE — Telephone Encounter (Signed)
 FYI Only or Action Required?: Action required by provider: refused ED, new med request.  Patient was last seen in primary care on 11/11/2023 by Alphonsa Glendia LABOR, MD.  Called Nurse Triage reporting Heartburn.  Symptoms began today.  Interventions attempted: Nothing.  Symptoms are: gradually worsening.  Triage Disposition: Go to ED Now (Notify PCP)  Patient/caregiver understands and will follow disposition?: No, wishes to speak with PCP

## 2023-11-19 NOTE — Telephone Encounter (Signed)
 Reason for Disposition . [1] Chest pain (or angina) comes and goes AND [2] is happening more often (increasing in frequency) or getting worse (increasing in severity)  (Exception: Chest pains that last only a few seconds.)  Answer Assessment - Initial Assessment Questions Additional info: Refusing ER.  Patient called in to request prescription for acid reflux, she has not been diagnosed with acid reflux but states she knows this is what is causing her pain because this happened last month as well and her daughter shared her acid reflux prescription which cleared her chest pain. She described pain as above stomach and under rib sharp pain that will change to nagging pain when laying flat, she never regurgitates or experiences sour taste. Advised patient on ER but she refuses nothing wrong with my heart its reflux and insists on pcp calling in reflux medication. She would like a call back today either way.   1. LOCATION: Where does it hurt?       Above stomach and under rib   2. RADIATION: Does the pain go anywhere else? (e.g., into neck, jaw, arms, back)     denies 3. ONSET: When did the chest pain begin? (Minutes, hours or days)      2am  4. PATTERN: Does the pain come and go, or has it been constant since it started?  Does it get worse with exertion?      Intermittent. Sharp when upright, nagging when she lays down  5. DURATION: How long does it last (e.g., seconds, minutes, hours)     Varies 6. SEVERITY: How bad is the pain?  (e.g., Scale 1-10; mild, moderate, or severe)     Worse with laying down  7. CARDIAC RISK FACTORS: Do you have any history of heart problems or risk factors for heart disease? (e.g., angina, prior heart attack; diabetes, high blood pressure, high cholesterol, smoker, or strong family history of heart disease)     Denies has dx of htn 8. PULMONARY RISK FACTORS: Do you have any history of lung disease?  (e.g., blood clots in lung, asthma, emphysema,  birth control pills)      9. CAUSE: What do you think is causing the chest pain?     Heart burn  10. OTHER SYMPTOMS: Do you have any other symptoms? (e.g., dizziness, nausea, vomiting, sweating, fever, difficulty breathing, cough)       Denies. Sugar is good today.  Protocols used: Chest Pain-A-AH

## 2023-11-19 NOTE — Telephone Encounter (Signed)
 This RN attempted to reach patient to discuss symptoms. VM not set up at this time. Attempt #1  Copied from CRM #[id number]. Topic: Clinical - Medication Question >> Nov 19, 2023  8:24 AM [name] wrote: Reason for CRM: Patient would like acid reflux script?  She said it started at midnight and its been all morning.

## 2023-11-20 ENCOUNTER — Other Ambulatory Visit: Payer: Self-pay | Admitting: Nurse Practitioner

## 2023-11-20 ENCOUNTER — Encounter: Payer: Self-pay | Admitting: Nurse Practitioner

## 2023-11-20 MED ORDER — PANTOPRAZOLE SODIUM 40 MG PO TBEC: 40.0000 mg | DELAYED_RELEASE_TABLET | Freq: Every day | ORAL | 0 refills | Status: AC

## 2023-11-20 NOTE — Telephone Encounter (Signed)
 Medication sent in and message sent. No answer on phone and no voicemail.

## 2023-11-27 LAB — HEMOGLOBIN A1C
Est. average glucose Bld gHb Est-mCnc: 123 mg/dL
Hgb A1c MFr Bld: 5.9 % — ABNORMAL HIGH (ref 4.8–5.6)

## 2023-11-27 LAB — BASIC METABOLIC PANEL WITH GFR
BUN/Creatinine Ratio: 22 (ref 12–28)
BUN: 24 mg/dL (ref 8–27)
CO2: 21 mmol/L (ref 20–29)
Calcium: 9.4 mg/dL (ref 8.7–10.3)
Chloride: 107 mmol/L — ABNORMAL HIGH (ref 96–106)
Creatinine, Ser: 1.11 mg/dL — ABNORMAL HIGH (ref 0.57–1.00)
Glucose: 93 mg/dL (ref 70–99)
Potassium: 3.8 mmol/L (ref 3.5–5.2)
Sodium: 140 mmol/L (ref 134–144)
eGFR: 52 mL/min/1.73 — ABNORMAL LOW (ref >59)

## 2023-11-29 ENCOUNTER — Ambulatory Visit: Payer: Self-pay | Admitting: Family Medicine

## 2023-12-28 ENCOUNTER — Other Ambulatory Visit (HOSPITAL_COMMUNITY): Payer: Self-pay

## 2023-12-30 ENCOUNTER — Telehealth: Payer: Self-pay

## 2023-12-30 ENCOUNTER — Other Ambulatory Visit (HOSPITAL_COMMUNITY): Payer: Self-pay

## 2023-12-30 NOTE — Telephone Encounter (Signed)
 Pharmacy Patient Advocate Encounter  Received notification from [employer] that Prior Authorization for Mounjaro  12.5mg /0.48ml has been APPROVED from 12/28/23 to 12/27/24   PA #/Case ID/Reference #: [id number]  Approval letter indexed to media tab

## 2024-01-11 ENCOUNTER — Other Ambulatory Visit (INDEPENDENT_AMBULATORY_CARE_PROVIDER_SITE_OTHER)

## 2024-01-11 ENCOUNTER — Ambulatory Visit (INDEPENDENT_AMBULATORY_CARE_PROVIDER_SITE_OTHER): Admitting: Orthopedic Surgery

## 2024-01-11 ENCOUNTER — Encounter: Payer: Self-pay | Admitting: Orthopedic Surgery

## 2024-01-11 ENCOUNTER — Other Ambulatory Visit

## 2024-01-11 VITALS — BP 123/76 | HR 86 | Ht 67.0 in | Wt 250.0 lb

## 2024-01-11 DIAGNOSIS — M19012 Primary osteoarthritis, left shoulder: Secondary | ICD-10-CM | POA: Diagnosis not present

## 2024-01-11 DIAGNOSIS — M79602 Pain in left arm: Secondary | ICD-10-CM

## 2024-01-11 DIAGNOSIS — M79601 Pain in right arm: Secondary | ICD-10-CM

## 2024-01-11 DIAGNOSIS — M25512 Pain in left shoulder: Secondary | ICD-10-CM

## 2024-01-11 DIAGNOSIS — M542 Cervicalgia: Secondary | ICD-10-CM

## 2024-01-11 DIAGNOSIS — M25552 Pain in left hip: Secondary | ICD-10-CM

## 2024-01-11 DIAGNOSIS — G8929 Other chronic pain: Secondary | ICD-10-CM

## 2024-01-11 DIAGNOSIS — M25511 Pain in right shoulder: Secondary | ICD-10-CM

## 2024-01-11 NOTE — Progress Notes (Signed)
" ° °  Chief Complaint  Patient presents with   Shoulder Pain    both    HPI  73 year old female with history of osteoarthritis presents with left shoulder pain, right shoulder pain and weakness, neck pain and radiation of pain into the right forearm  The patient reports multiple falls over the last 2 years with fall and late summer early fall resulting in weakness in the right upper extremity often requiring her left arm to lift her right arm above her head  PHYSICAL EXAM:   Left shoulder external rotation is normal patient has normal forward elevation with no weakness chest pain in the glenohumeral joint  On the right side she has weakness in abduction and flexion with decreased range of motion in terms of abduction and flexion but normal external rotation  She has cervical spine tenderness and right trapezius tenderness and decreased flexion extension  DG Cervical Spine 2 or 3 views Result Date: 01/11/2024 C-spine x-rays for arm pain on the right radiating from the cervical spine.  X-rays show loss of cervical lordosis anterior superior and inferior endplate osteophytes to space narrowing in the mid cervical spine consistent with cervical spondylosis moderate   DG Shoulder Left Result Date: 01/11/2024 Left shoulder pain chronic.  X-ray shows narrowing of the glenohumeral joint osteophytes at the inferior aspect of the humerus and glenoid degenerative changes in the greater tuberosity and undersurface of the rotator cuff consistent with glenohumeral arthritis moderate      Assessment and Plan:   Diagnosis glenohumeral arthritis left moderate  Arthritis right moderate  Cervical spondylosis moderate   The patient is also having some left hip pain wishes to have an x-ray done at some point  Recommend physical therapy return in 3 months  At that time reassess shoulder pain neck pain and x-ray left hip  "

## 2024-01-11 NOTE — Patient Instructions (Signed)
 Physical therapy has been ordered for you at [facility]. They should call you to schedule, [phone] is the phone number to call, if you want to call to schedule.

## 2024-01-11 NOTE — Progress Notes (Signed)
" °  Intake history:  Chief Complaint  Patient presents with   Shoulder Pain    both     BP 123/76   Pulse 86   Ht 5' 7 (1.702 m)   Wt 250 lb (113.4 kg)   BMI 39.16 kg/m  Body mass index is 39.16 kg/m.  Pharmacy? ___[facility] ___________________________________  WHAT ARE WE SEEING YOU FOR TODAY?   Neck and both shoulder/s down right arm  How long has this bothered you? (DOI?DOS?WS?)  Fell in Aug or Sept  Was there an injury? Yes  Anticoag.  No   Any ALLERGIES _____Allergies[1] _________________________________________   Treatment:  Have you taken:  Tylenol  No  Advil  No  Had PT No  Had injection No  Other  _________________________        [1]  Allergies Allergen Reactions   Penicillins Itching    Has patient had a PCN reaction causing immediate rash, facial/tongue/throat swelling, SOB or lightheadedness with hypotension: No Has patient had a PCN reaction causing severe rash involving mucus membranes or skin necrosis: No Has patient had a PCN reaction that required hospitalization: No Has patient had a PCN reaction occurring within the last 10 years: No If all of the above answers are NO, then may proceed with Cephalosporin use.    Tramadol  Itching   Hydrocodone  Itching   "

## 2024-01-19 ENCOUNTER — Ambulatory Visit: Payer: Self-pay

## 2024-01-19 NOTE — Telephone Encounter (Signed)
 FYI Only or Action Required?: FYI only for provider: appointment scheduled on 01/20/24.  Patient was last seen in primary care on 11/11/2023 by [name] LABOR, MD.  Called Nurse Triage reporting Cough.  Symptoms began several weeks ago.  Interventions attempted: OTC medications: Robitussin.  Symptoms are: stable.  Triage Disposition: See PCP When Office is Open (Within 3 Days)  Patient/caregiver understands and will follow disposition?:    Copied from CRM #[id number]. Topic: Clinical - Red Word Triage >> Jan 19, 2024  2:26 PM [name] wrote: Red Word that prompted transfer to Nurse Triage: Patient has been sick for 3 weeks. Cough, mucus with blood. Ginger tea and Robitussin   Would like an appt. Reason for Disposition  Cough has been present for > 3 weeks  Answer Assessment - Initial Assessment Questions 1. ONSET: When did the cough begin?      3 weeks ago 2. SEVERITY: How bad is the cough today?      Mild to moderate 3. SPUTUM: Describe the color of your sputum (e.g., none, dry cough; clear, white, yellow, green)     yellow 4. HEMOPTYSIS: Are you coughing up any blood? If Yes, ask: How much? (e.g., flecks, streaks, tablespoons, etc.)     *No Answer* 5. DIFFICULTY BREATHING: Are you having difficulty breathing? If Yes, ask: How bad is it? (e.g., mild, moderate, severe)      denies 6. FEVER: Do you have a fever? If Yes, ask: What is your temperature, how was it measured, and when did it start?     *No Answer* 7. CARDIAC HISTORY: Do you have any history of heart disease? (e.g., heart attack, congestive heart failure)      *No Answer* 8. LUNG HISTORY: Do you have any history of lung disease?  (e.g., pulmonary embolus, asthma, emphysema)     no 9. PE RISK FACTORS: Do you have a history of blood clots? (or: recent major surgery, recent prolonged travel, bedridden)     *No Answer* 10. OTHER SYMPTOMS: Do you have any other symptoms? (e.g., runny nose,  wheezing, chest pain)       *No Answer* 11. PREGNANCY: Is there any chance you are pregnant? When was your last menstrual period?       *No Answer* 12. TRAVEL: Have you traveled out of the country in the last month? (e.g., travel history, exposures)       *No Answer*  Protocols used: Cough - Acute Productive-A-AH

## 2024-01-20 ENCOUNTER — Ambulatory Visit (INDEPENDENT_AMBULATORY_CARE_PROVIDER_SITE_OTHER): Admitting: Family Medicine

## 2024-01-20 VITALS — BP 115/72 | HR 79 | Temp 96.0°F | Ht 67.0 in | Wt 245.5 lb

## 2024-01-20 DIAGNOSIS — N1831 Chronic kidney disease, stage 3a: Secondary | ICD-10-CM | POA: Diagnosis not present

## 2024-01-20 DIAGNOSIS — Z7985 Long-term (current) use of injectable non-insulin antidiabetic drugs: Secondary | ICD-10-CM | POA: Diagnosis not present

## 2024-01-20 DIAGNOSIS — Z79899 Other long term (current) drug therapy: Secondary | ICD-10-CM | POA: Diagnosis not present

## 2024-01-20 DIAGNOSIS — J019 Acute sinusitis, unspecified: Secondary | ICD-10-CM

## 2024-01-20 DIAGNOSIS — E785 Hyperlipidemia, unspecified: Secondary | ICD-10-CM

## 2024-01-20 DIAGNOSIS — E119 Type 2 diabetes mellitus without complications: Secondary | ICD-10-CM

## 2024-01-20 DIAGNOSIS — E1169 Type 2 diabetes mellitus with other specified complication: Secondary | ICD-10-CM

## 2024-01-20 DIAGNOSIS — L309 Dermatitis, unspecified: Secondary | ICD-10-CM

## 2024-01-20 MED ORDER — DOXYCYCLINE HYCLATE 100 MG PO TABS: 100.0000 mg | ORAL_TABLET | Freq: Two times a day (BID) | ORAL | 0 refills | Status: AC

## 2024-01-20 MED ORDER — HYDROCORTISONE 2.5 % EX CREA: TOPICAL_CREAM | CUTANEOUS | 2 refills | Status: AC

## 2024-01-20 NOTE — Progress Notes (Signed)
 "  Subjective:    Patient ID: [name], female    DOB: 08-22-1950, 74 y.o.   MRN: [id number]  HPI Discussed the use of AI scribe software for clinical note transcription with the patient, who gave verbal consent to proceed.  History of Present Illness   [name] is a 74 year old female who presents with persistent cough and phlegm production.  She began experiencing symptoms after returning home from a trip. Initially, she felt fine, but by Saturday night, she started feeling unwell. By Sunday morning, she began coughing and producing yellow phlegm. She attempted self-treatment with ginger, but her symptoms did not improve, prompting her to contact her son and subsequently make an appointment. No chest pain, shortness of breath, or body aches. She has been mostly staying indoors since the onset of her symptoms.  Regarding her diabetes management, she feels well and adheres to her medication regimen, including a weekly Mounjaro  injection every Tuesday. She also takes her cholesterol medication regularly and is mindful of her dietary choices.  She mentions a recent visit for shoulder pain, where she was advised to undergo therapy. However, she postponed therapy due to her current illness. She also reports knee pain, particularly in her right knee, which was surgically treated 25 years ago. She experiences pain when sitting low and requires assistance to stand up. Additionally, she has pain in her right hip, which has not been surgically treated, and in her left hip, which has been operated on. She has a history of falls, with the last incidents occurring last year. She has since had a walk-in shower installed to prevent further falls. She reports daily pain in her hips and knees but does not take any medication for it, opting to 'just bear with it.'  She also reports scalp itching and pain, which she attributes to a past accident. She has had an x-ray of her head, which did not reveal any  abnormalities. She has tried using lotion for the itching, but it has not been effective.  She spent Christmas with her niece Holley and Hoyle at [employer] in [place]. They had a small gathering and prepared a simple meal.        Review of Systems     Objective:   Physical Exam  General-in no acute distress Eyes-no discharge Lungs-respiratory rate normal, CTA CV-no murmurs,RRR Extremities skin warm dry no edema Neuro grossly normal Behavior normal, alert  Acute rhinosinusitis Likely viral with cough, yellow phlegm, mild sinus symptoms. No pneumonia signs. - Prescribed doxycycline  100 mg twice daily for 7 days with food and water .  Type 2 diabetes mellitus Well-controlled with Mounjaro  and diet. Recent A1c satisfactory. - Continue current diabetes management regimen. - Provided papers for blood work in early May, including A1c and urine test.  Stage 3a chronic kidney disease Managed with HCTZ. Blood pressure controlled at 130/77 mmHg. - Continue current management with HCTZ.  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 Pain persists despite Tylenol  and self-management. - Advised follow-up with orthopedic specialist for right knee evaluation. - Continue current pain management strategies.  Scalp pruritus and pain Chronic with no clear etiology. Previous imaging normal. - Referred to dermatology for further evaluation and management.  General Health Maintenance Routine health maintenance discussed. - Ensure eye exam is scheduled for October. - Perform blood work in early May, including A1c and urine test.  Assessment & Plan:  .  He1. Type 2 diabetes mellitus with hemoglobin A1c goal of less than 7.5% (HCC) (Primary) Fairly decent control check labs before next visit Healthy diet Continue current meds - Basic metabolic panel with GFR - Microalbumin/Creatinine Ratio,  Urine - Hemoglobin A1c  2. Stage 3a chronic kidney disease (HCC) Stay well-hydrated keep blood pressure under control check labs before next visit  3. Hyperlipidemia associated with type 2 diabetes mellitus (HCC) Goal is to see LDL below 70 if possible continue current meds - Lipid panel  4. Morbid obesity (HCC) Portion control regular physical activity  5. High risk medication use Lab pending - Hepatic function panel  6. Acute rhinosinusitis Started off of the virus no sign of pneumonia antibiotics for 7 days as directed doxycycline  twice daily with a snack and a tall glass of water  Follow-up 6 months  "

## 2024-01-29 ENCOUNTER — Other Ambulatory Visit (INDEPENDENT_AMBULATORY_CARE_PROVIDER_SITE_OTHER): Payer: Self-pay

## 2024-01-29 ENCOUNTER — Encounter: Payer: Self-pay | Admitting: Orthopedic Surgery

## 2024-01-29 ENCOUNTER — Ambulatory Visit: Admitting: Orthopedic Surgery

## 2024-01-29 VITALS — BP 115/72 | Ht 67.0 in | Wt 245.0 lb

## 2024-01-29 DIAGNOSIS — M4316 Spondylolisthesis, lumbar region: Secondary | ICD-10-CM

## 2024-01-29 DIAGNOSIS — M25551 Pain in right hip: Secondary | ICD-10-CM | POA: Diagnosis not present

## 2024-01-29 DIAGNOSIS — Z96642 Presence of left artificial hip joint: Secondary | ICD-10-CM

## 2024-01-29 DIAGNOSIS — M5410 Radiculopathy, site unspecified: Secondary | ICD-10-CM | POA: Diagnosis not present

## 2024-01-29 MED ORDER — TIZANIDINE HCL 4 MG PO TABS: 4.0000 mg | ORAL_TABLET | Freq: Four times a day (QID) | ORAL | 0 refills | Status: AC | PRN

## 2024-01-29 NOTE — Progress Notes (Signed)
" °  Intake history:  Chief Complaint  Patient presents with   Hip Pain    RIGHT lateral hip pain, denies back pain history of left hip replaced years ago    Shoulder Pain    Right     BP 115/72 Comment: 01/20/24  Ht 5' 7 (1.702 m)   Wt 245 lb (111.1 kg)   BMI 38.37 kg/m  Body mass index is 38.37 kg/m.  Pharmacy? ___[facility] 14___________________________________  WHAT ARE WE SEEING YOU FOR TODAY?   Right shoulder pain and right hip pain   How long has this bothered you? (DOI?DOS?WS?)  Years several / getting worse  Was there an injury? No  Anticoag.  No   Any ALLERGIES ______________Allergies[1] ________________________________   Treatment:  Have you taken:  Tylenol  No  Advil  No  Had PT No  Had injection No  Other  _________________________        [1]  Allergies Allergen Reactions   Penicillins Itching    Has patient had a PCN reaction causing immediate rash, facial/tongue/throat swelling, SOB or lightheadedness with hypotension: No Has patient had a PCN reaction causing severe rash involving mucus membranes or skin necrosis: No Has patient had a PCN reaction that required hospitalization: No Has patient had a PCN reaction occurring within the last 10 years: No If all of the above answers are NO, then may proceed with Cephalosporin use.    Tramadol  Itching   Hydrocodone  Itching   "

## 2024-01-29 NOTE — Patient Instructions (Addendum)
 Physical therapy has been ordered for you at [facility]. They should call you to schedule, [phone] is the phone number to call, if you want to call to schedule.   For pain   Take 500 mg tylenol  every 6 hrs and   Meds ordered this encounter  Medications   tiZANidine  (ZANAFLEX ) 4 MG tablet    Sig: Take 1 tablet (4 mg total) by mouth every 6 (six) hours as needed for muscle spasms.    Dispense:  30 tablet    Refill:  0

## 2024-01-29 NOTE — Progress Notes (Signed)
 "  Patient: [name]           Date of Birth: 30-Dec-1950           MRN: [id number] Visit Date: 01/29/2024 Requested by: Alphonsa Glendia LABOR, MD [address] [place],  [place] [address] PCP: Alphonsa Glendia LABOR, MD  Encounter Diagnoses  Name Primary?   Pain in right hip    Spondylolisthesis, lumbar region Yes   Radicular pain of right lower extremity     Assessment and plan:  I think her right knee pain is part of her lower back symptoms and right leg radicular pain  Her right hip does not seem to be arthritic and does not need surgery.  She is noted to have some sluggish kidney function and her meloxicam  was stopped.  I did message Dr. Alphonsa if he had any ideas about a pain medication for her I did tell her to continue her Tylenol  500 mg Q6 which has not been very effective and I added tizanidine  and physical therapy    Meds ordered this encounter  Medications   tiZANidine  (ZANAFLEX ) 4 MG tablet    Sig: Take 1 tablet (4 mg total) by mouth every 6 (six) hours as needed for muscle spasms.    Dispense:  30 tablet    Refill:  0     Meds ordered this encounter  Medications   tiZANidine  (ZANAFLEX ) 4 MG tablet    Sig: Take 1 tablet (4 mg total) by mouth every 6 (six) hours as needed for muscle spasms.    Dispense:  30 tablet    Refill:  0     Chief Complaint  Patient presents with   Hip Pain    RIGHT lateral hip pain, denies back pain history of hip replaced years ago    Shoulder Pain    Right    History:  This is a 74 year old female had a left total knee in 2012, right total knee in 2017, a left total hip in 2008  Presents today complaining of right hip pain along the lateral side over the greater trochanter and over the lower back.  She also has some right knee pain as well.  One of the knees was done in [place], I believe it was the left 1    Focused exam findings:  Range of motion of the right hip produces 125 degrees of flexion with no pain  she does have decreased internal rotation  She is tender on the lower back on the right side the lateral hip over the greater trochanter the lateral thigh and the lateral compartment of the lower leg but has no weakness    DG Lumbar Spine 2-3 Views Result Date: 01/29/2024 DG lumbar spine 2-3 views Chief complaint was hip pain clinical exam suggested back pain as the cause of her symptoms X-rays show spondylolisthesis grade 1 L4 on L5 endplate osteophytes S1 L4 L3 L2 facet arthritis L4-5, L5-S1, disc space narrowing L5-S1 as well In the AP x-ray there is a small scoliosis in the coronal plane less than 10 degrees degenerative changes also seen on this view Impression spondylosis of the lumbar spine moderate with grade 1 spondylolisthesis of L5 on S1   DG Pelvis 1-2 Views Result Date: 01/29/2024 Chief complaint was right hip pain DG pelvis 1 view 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

## 2024-02-23 ENCOUNTER — Ambulatory Visit (HOSPITAL_COMMUNITY)

## 2024-04-11 ENCOUNTER — Ambulatory Visit: Admitting: Orthopedic Surgery

## 2024-05-11 ENCOUNTER — Ambulatory Visit: Admitting: Family Medicine

## 2024-05-27 ENCOUNTER — Ambulatory Visit: Admitting: Family Medicine

## 2024-11-04 ENCOUNTER — Ambulatory Visit
